# Patient Record
Sex: Male | Born: 1945 | Race: White | Hispanic: No | Marital: Married | Smoking: Former smoker
Health system: Southern US, Community
[De-identification: ages and names within clinical notes are randomized; demographics above are authoritative.]

## PROBLEM LIST (undated history)

## (undated) DIAGNOSIS — I2510 Atherosclerotic heart disease of native coronary artery without angina pectoris: Secondary | ICD-10-CM

## (undated) DIAGNOSIS — N400 Enlarged prostate without lower urinary tract symptoms: Secondary | ICD-10-CM

## (undated) DIAGNOSIS — K635 Polyp of colon: Secondary | ICD-10-CM

## (undated) DIAGNOSIS — M25519 Pain in unspecified shoulder: Secondary | ICD-10-CM

## (undated) DIAGNOSIS — R0989 Other specified symptoms and signs involving the circulatory and respiratory systems: Secondary | ICD-10-CM

## (undated) DIAGNOSIS — E785 Hyperlipidemia, unspecified: Secondary | ICD-10-CM

## (undated) DIAGNOSIS — I10 Essential (primary) hypertension: Secondary | ICD-10-CM

## (undated) DIAGNOSIS — H269 Unspecified cataract: Secondary | ICD-10-CM

## (undated) DIAGNOSIS — R0602 Shortness of breath: Secondary | ICD-10-CM

## (undated) DIAGNOSIS — I219 Acute myocardial infarction, unspecified: Secondary | ICD-10-CM

## (undated) DIAGNOSIS — N200 Calculus of kidney: Secondary | ICD-10-CM

## (undated) DIAGNOSIS — C61 Malignant neoplasm of prostate: Secondary | ICD-10-CM

## (undated) DIAGNOSIS — I4891 Unspecified atrial fibrillation: Secondary | ICD-10-CM

## (undated) HISTORY — DX: Other specified symptoms and signs involving the circulatory and respiratory systems: R09.89

## (undated) HISTORY — PX: CATARACT EXTRACTION, BILATERAL: SHX1313

## (undated) HISTORY — DX: Unspecified cataract: H26.9

## (undated) HISTORY — DX: Acute myocardial infarction, unspecified: I21.9

## (undated) HISTORY — DX: Unspecified atrial fibrillation: I48.91

## (undated) HISTORY — DX: Malignant neoplasm of prostate: C61

## (undated) HISTORY — DX: Pain in unspecified shoulder: M25.519

## (undated) HISTORY — DX: Shortness of breath: R06.02

## (undated) HISTORY — DX: Polyp of colon: K63.5

## (undated) HISTORY — DX: Hyperlipidemia, unspecified: E78.5

## (undated) HISTORY — PX: BLADDER SURGERY: SHX569

## (undated) HISTORY — DX: Calculus of kidney: N20.0

## (undated) HISTORY — DX: Atherosclerotic heart disease of native coronary artery without angina pectoris: I25.10

## (undated) HISTORY — DX: Benign prostatic hyperplasia without lower urinary tract symptoms: N40.0

## (undated) HISTORY — DX: Essential (primary) hypertension: I10

---

## 1995-10-31 DIAGNOSIS — I219 Acute myocardial infarction, unspecified: Secondary | ICD-10-CM

## 1995-10-31 HISTORY — DX: Acute myocardial infarction, unspecified: I21.9

## 2003-01-01 ENCOUNTER — Observation Stay (HOSPITAL_COMMUNITY): Admission: RE | Admit: 2003-01-01 | Discharge: 2003-01-02 | Payer: Self-pay | Admitting: Neurosurgery

## 2004-04-19 ENCOUNTER — Inpatient Hospital Stay (HOSPITAL_COMMUNITY): Admission: EM | Admit: 2004-04-19 | Discharge: 2004-04-24 | Payer: Self-pay | Admitting: Cardiovascular Disease

## 2004-04-19 HISTORY — PX: OTHER SURGICAL HISTORY: SHX169

## 2004-04-19 HISTORY — PX: CARDIAC CATHETERIZATION: SHX172

## 2004-04-20 HISTORY — PX: CORONARY ARTERY BYPASS GRAFT: SHX141

## 2004-05-20 ENCOUNTER — Encounter: Admission: RE | Admit: 2004-05-20 | Discharge: 2004-05-20 | Payer: Self-pay | Admitting: Cardiothoracic Surgery

## 2005-07-23 IMAGING — CR DG CHEST 1V PORT
1 series · 1 of 1 positions shown · non-contrast
Comparison: none

CLINICAL DATA: Acute M.I. 
 CHEST PORTABLE ([DATE] HOURS)
 Heart size is normal.  There is no heart failure.  Lungs are clear.  Probable COPD. 
 IMPRESSION
 No active disease.

[view not recorded]
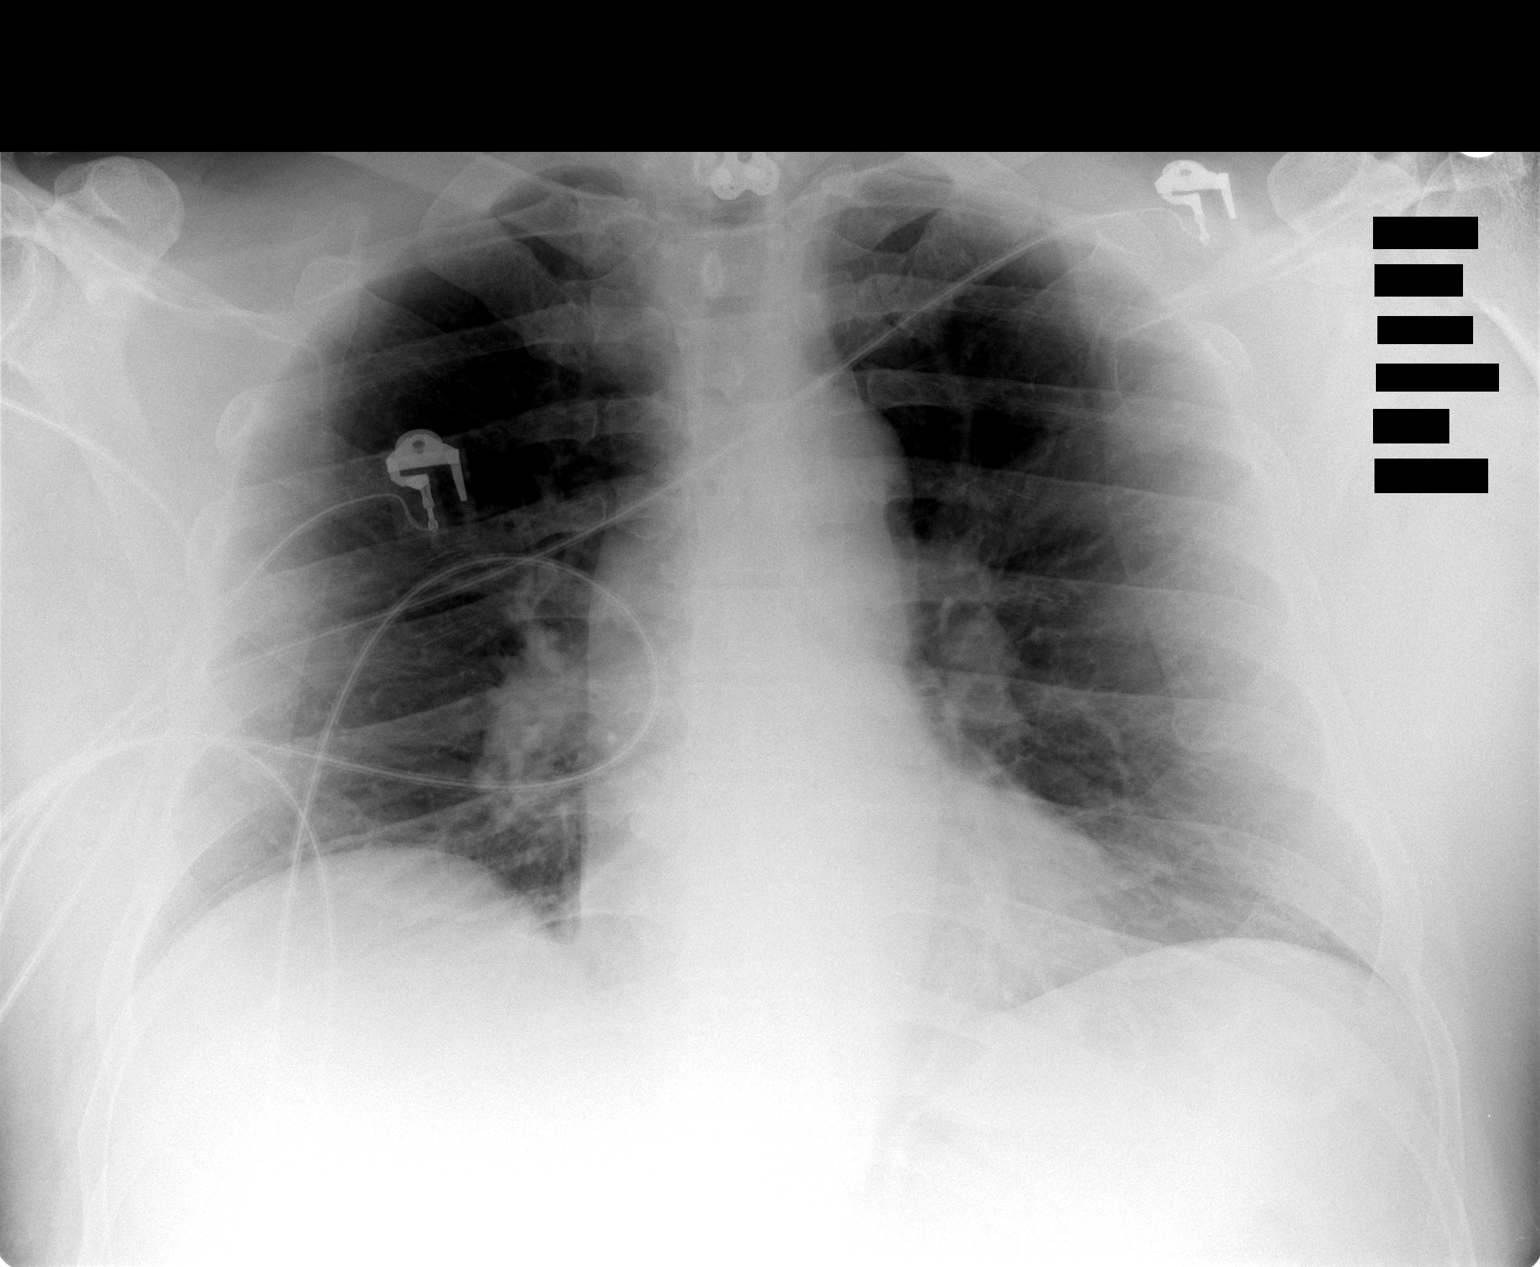

[1 of 1 positions shown; findings below may reference images not displayed]

## 2008-05-14 ENCOUNTER — Ambulatory Visit: Payer: Self-pay | Admitting: Internal Medicine

## 2008-05-22 ENCOUNTER — Ambulatory Visit: Payer: Self-pay | Admitting: Internal Medicine

## 2008-05-22 ENCOUNTER — Encounter: Payer: Self-pay | Admitting: Internal Medicine

## 2008-05-26 ENCOUNTER — Encounter: Payer: Self-pay | Admitting: Internal Medicine

## 2009-07-01 HISTORY — PX: CARDIOVASCULAR STRESS TEST: SHX262

## 2011-03-17 NOTE — H&P (Signed)
Leonard Washington, Leonard Washington                            ACCOUNT NO.:  [id number]   MEDICAL RECORD NO.:  [id number]                   PATIENT TYPE:  INP   LOCATION:  2027                                 FACILITY:  [facility]   PHYSICIAN:  Leonard Washington, M.D.          DATE OF BIRTH:  1946/07/30   DATE OF ADMISSION:  04/19/2004  DATE OF DISCHARGE:  04/24/2004                                HISTORY & PHYSICAL   CHIEF COMPLAINT:  Chest pain.   HISTORY OF PRESENT ILLNESS:  The patient is a 65 year old male with a past  history of coronary disease.  He had two LAD stents placed by Dr. Alanda Washington  in 1997.  He followed up with Dr. Sherlyn Washington in [place] after that but has not  seen him since 2001.  He has had no recurrent cardiac problems.  He has been  working full time.  Two days prior to his admission he developed some  substernal chest pain that lasted about 10-15 minutes and was relieved  spontaneously.  He did not seek medical attention.  At about 11 p.m. on April 18, 2004, he was awakened with severe substernal chest pain with radiation  to the left neck and left upper extremity with nausea and diaphoresis.  He  presented to [facility] Emergency Room.  EKG there showed inferior  lateral ST elevation with small Q-waves.  His pain was controlled to 1/10  with nitroglycerin, heparin and oxygen.  He was transferred by [facility] to  [facility] and seen by Dr. Alanda Washington at about 2 a.m. on April 19, 2004.  Dr.  Alanda Washington felt it would be best to proceed with catheterization.  The  patient's past medical history is unremarkable for hypertension or diabetes.  His cholesterol status is unknown.   PAST SURGICAL HISTORY:  His only surgeries in the past include C7-T1  cervical diskectomy and decompression by Dr. Delma Washington in March 2004.   CURRENT MEDICATIONS:  None.   ALLERGIES:  He has no known drug allergies.   SOCIAL HISTORY:  He is a nonsmoker.  He quit in the 1980s.  He is married  with three  children and he has one grandchild.  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   FAMILY HISTORY:  His family history is unremarkable for coronary disease.  His father died at 63 in accident.  His mother is alive at 57.  He has no  brothers and he has a step sister.   REVIEW OF SYSTEMS:  Essentially unremarkable except as noted above.  He does  wear glasses.  He denies any melena, peptic ulcer disease or GI bleeding.  He has not had syncope.  He has never had a stroke or symptoms of TIA.  He  has not had thyroid problems.  He has not had kidney problems or prostate  trouble.   PHYSICAL EXAMINATION:  VITAL SIGNS:  Blood pressure 140/80, pulse 68,  respirations 20.  GENERAL:  He is a well-developed, well-nourished male in no acute distress.  HEENT:  Normocephalic.  Extraocular movements are intact.  Sclerae is  nonicteric.  NECK:  Without JVD or bruit.  CHEST:  Clear to auscultation and percussion.  CARDIAC:  Regular rate and rhythm without obvious murmur, rub or gallop.  Normal S1 and S2.  The PMI is at the left sixth intercostal space.  He has  no S3.  ABDOMEN:  Nontender.  No hepatosplenomegaly.  No bruits.  EXTREMITIES:  Without edema. Distal pulses are intact bilaterally.  Femorals  are 3+ without bruits.  NEUROLOGICAL:  Exam is grossly intact.  He is awake, alert, oriented,  cooperative and moves all extremities without obvious deficits.   LABORATORY DATA:  His EKG reveals Q-waves in the inferior leads consistent  with a DMI and ST elevation in the inferior lateral leads.   Labs:  Renal function is normal with a BUN of 15, creatinine 0.9.  Hemoglobin 16.9, hematocrit 51.2.  Potassium 3.4.   Chest x-ray shows no acute changes.   IMPRESSION:  1. Acute diaphragmatic myocardial infarction.  2. Known coronary disease with prior left anterior descending stenting x2     April 1997.  3. Medical noncompliance with no followup since 2001.  4. 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  5. Ex-smoker.   PLAN:  The patient was seen by Dr. Alanda Washington and admitted, taken to the  catheterization lab.      Leonard Washington, P.A.                      Leonard Washington, M.D.    Leonard Washington  D:  04/24/2004  T:  04/24/2004  Job:  [id number]

## 2011-03-17 NOTE — Discharge Summary (Signed)
Leonard Washington, Leonard Washington                            ACCOUNT NO.:  [id number]   MEDICAL RECORD NO.:  [id number]                   PATIENT TYPE:  INP   LOCATION:  2027                                 FACILITY:  [facility]   PHYSICIAN:  Gwenith Daily. Tyrone Sage, M.D.            DATE OF BIRTH:  1945-11-30   DATE OF ADMISSION:  04/19/2004  DATE OF DISCHARGE:  04/24/2004                                 DISCHARGE SUMMARY   CARDIOLOGIST:  Gerlene Burdock A. Alanda Amass, M.D.   PRIMARY CARE PHYSICIAN:  Dr. Roney Marion   ADMISSION DIAGNOSES:  Known coronary artery disease status post acute  myocardial infarction.   DISCHARGE/SECONDARY DIAGNOSES:  1. Known coronary artery disease status post acute myocardial infarction.     Status post coronary artery bypass grafting.  2. History of prior acute anterior myocardial infarction in 1997 status post     2 stents placed in his left anterior descending artery.  3. Hyperlipidemia.  4. History of tobacco use, quit in 1980.  5. Situational anxiety.  6. Postoperative insomnia, most likely contributed by anxiety.  7. History of anterior cervical diskectomy/decompression by Dr. Lovell Sheehan in     2004.  8. History of back surgery in 1976 by Dr. Samara Snide.   BRIEF HISTORY:  Leonard Washington is a 65 year old Caucasian male with known coronary  artery disease having two stents placed in his LAD in 1997 after suffering  an acute myocardial infarction. He did well after several years and was no  longer being followed by his cardiologist; however, the night of April 18, 2004, he awakened with substernal chest pain radiating to his shoulders  bilaterally. He had noticed this two days prior as well; however, this  resolved spontaneously. Because of ongoing pain and recurrent nature of the  pain, he presented to [facility] emergency room and was treated. Within 30  minutes, he became pain free and was transferred to [facility] on  April 19, 2004 to undergo cardiac catheterization by Dr.  Alanda Amass.   On April 19, 2004, Leonard Washington was transferred from [facility] emergency room to  [facility] to undergo cardiac catheterization by Dr. Alanda Amass.  Cardiac catheterization findings showed 70% left main stenosis with three-  vessel coronary artery disease. His ejection fraction was 50%. Based on  these findings, he was referred to Dr. Sheliah Plane for possible surgical  revascularization. Dr. Tyrone Sage did evaluation Mr. Llamas that same day. After  exam of the patient and review of his cardiac catheterization, Dr. Tyrone Sage  did feel that he would benefit from coronary artery bypass surgery; however,  Dr. Tyrone Sage would be unable to perform the procedure due to scheduling  conflicts. Instead was to be performed by Dr. Dennie Maizes associate, Dr.  Kathlee Nations Trigt.   On April 20, 2004, Leonard Washington underwent coronary artery bypass grafting x4  using a left internal mammary artery to the left anterior descending artery,  left  greater artery to the right coronary artery, saphenous vein graft to  the ramus intermedius, and saphenous vein graft to the circumflex marginal.  Endoscopic vein harvesting was taken from the right thigh. He tolerated the  procedure well and was transferred from the operating room to the surgical  intensive care unit in stable condition.   Leonard Washington postoperative course was rather noneventful. He did remain in  intensive care unit through postoperative day #2. He was extubated  neurologically intact. He initially did have a pericardial rub which did  resolve. He also required dopamine drip postoperatively but was weaned  without difficulty. He did show mild fluid volume excess and was started on  short term diuretic therapy. His postoperative labs were stable.   On the afternoon of postoperative day #2, Leonard Washington was transferred to unit  2000, a telemetry unit. Over the next few days, he began mobilizing with  cardiac rehab. Eventually, he was ambulating the  hallways without assist. He  was tolerating oral diet, and his bowel and bladder functions had returned  to normal. His pain was controlled on oral medication. His blood sugars were  monitored initially postoperatively but were relatively normal, ranging  between 101 and 129. A hemoglobin A1c was drawn which was normal at 5.4.  Prior to his transfer to the floor, his chest tube had also been  discontinued without incident. There was no pneumothorax. Followup chest x-  ray showed bibasilar atelectasis with small effusions. On postoperative day  #3, he did report feelings of anxiety and was unable to sleep. Ativan and  Ambien were prescribed as needed. The following day, Leonard Washington reported that  he had slept much better and felt much more at ease. He was anxious to be  discharged home. He hemodynamically stable with a blood pressure of 109/78.  His heart rate was primarily in the 80s and 90s and was in sinus rhythm. He  was afebrile and his oxygen saturation was above 90% on room air. He had had  excellent diuresis, and his weight was now at baseline. On exam, his heart  had a regular rate and rhythm. No murmurs, rubs, or gallops were noted. His  lungs were clear. His abdominal exam was benign. His extremities were  without edema. His incisions were clean, dry, and intact without signs of  infection. His left hand was neurovascularly intact. It was felt due to his  excellent progression that he was stable for discharge home, and he was  discharged home later that day, April 24, 2004.   PROCEDURE:  1. On April 20, 2004, Leonard Washington underwent coronary artery bypass grafting x4     using a left internal mammary artery to the left anterior descending     artery, left radial artery to the right coronary artery, saphenous vein     graft to the ramus intermedius, saphenous vein graft to the circumflex    marginal, endoscopic radial harvest from the left arm and endoscopic vein     harvesting from the  right thigh. Surgeon was Dr. Kathlee Nations Trigt.  2. On April 19, 2004, Leonard Washington underwent cardiac catheterization by Dr.     Susa Griffins. Findings essentially showed left main disease with     three-vessel coronary artery disease. His ejection fraction was estimated     at 50 to 55%.   LABORATORY DATA:  On April 23, 2004, his white blood cell count was 9.8,  hemoglobin 9.8, hematocrit 28.2, platelet count 200. Sodium  137, potassium  3.8, blood glucose 112, BUN 18, creatinine 0.9. On June 21, hemoglobin A1c  was 5.4. His lipid panel showed total cholesterol of 198, triglycerides 132,  HDL 44, LDL 128. His liver function tests were normal with a total bilirubin  of 0.3, alkaline phosphatase 52, SGOT 27, SGPT 22. His total protein was  5.8, albumin 3.0. Preoperatively, his total CK was 169 with MB of 18 and a  troponin maximum of 1.5.   RADIOLOGIC FINDINGS:  Chest x-ray on April 23, 2004 showed stable appearance  of his chest with bibasilar atelectasis and small effusions.   Preoperative Dopplers showed no significant internal carotid artery disease  and normal ABIs measuring 1.47 bilaterally.   DISCHARGE MEDICATIONS:  1. Enteric-coated aspirin 325 mg 1 p.o. q.d.  2. Lopressor 25 mg 1 p.o. b.i.d.  3. Lipitor 20 mg 1 p.o. p.m.  4. Imdur 50 mg 1 p.o. q.d. for 1 month.  5. Ambien 5 mg 1 p.o. q.h.s. p.r.n. insomnia.  6. Ativan 0.5 mg 1 to 2 tablets p.o. b.i.d. p.r.n. anxiety.  7. Ultram 50 mg 1 to 2 tablets p.o. q.4-6h. p.r.n. pain.   ACTIVITY:  He is instructed to avoid driving or heavy lifting more than 10  pounds. He is encouraged to continue daily walking and breathing exercises.   DIET:  He is to follow a low fat, low salt diet.   WOUND CARE:  He may shower. He should notify the CVTS office if he develops  fever greater than 101 or redness or drainage from his incisions sites.   FOLLOW UP:  He is to follow up with Dr. Donata Clay in approximately three  weeks. The CVTS office will  contact him with specific appointment date and  time. He is to have a chest x-ray one hour before this appointment at the  [facility] and was instructed to bring his chest x-ray  with him to the appointment with Dr. Donata Clay.   He is to call __________  to schedule two-week followup with Susa Griffins.      Jerold Coombe, P.A.                  Gwenith Daily Tyrone Sage, M.D.    AWZ/MEDQ  D:  04/24/2004  T:  04/25/2004  Job:  [id number]   cc:   Roney Marion, M.D.   Richard A. Alanda Amass, M.D.  [phone] [address], [address]  [place]  [place] [address]  Fax: [phone]

## 2011-03-17 NOTE — Consult Note (Signed)
NAMEANDER, WAMSER                            ACCOUNT NO.:  [id number]   MEDICAL RECORD NO.:  [id number]                   PATIENT TYPE:  INP   LOCATION:  2931                                 FACILITY:  [facility]   PHYSICIAN:  Gwenith Daily. Tyrone Sage, M.D.            DATE OF BIRTH:  [birth year]   DATE OF CONSULTATION:  04/19/2004  DATE OF DISCHARGE:                                   CONSULTATION   REQUESTING PHYSICIAN:  Richard A. Alanda Amass, M.D.   FOLLOW UP CARDIOLOGIST:  Pearletha Furl. Alanda Amass, M.D.   PRIMARY CARE PHYSICIAN:  Gayland Curry, M.D., [place].   REASON FOR CONSULTATION:  Acute myocardial infarction.   HISTORY OF PRESENT ILLNESS:  The patient is a 65 year old male with known  coronary occlusive disease, having had 2 stents placed in the LAD in 1997,  after suffering an acute anterior myocardial infarction.  He did well and  after several years, quite cardiology follow up.   Last night, he was awakened with substernal chest pain radiating to his  shoulders bilaterally.  He noted that 2 days previous, he had a brief  episode of pain that resolved spontaneously. Because of the ongoing pain and  recurrent nature of the pain, he went to the [facility] Emergency Room and was  treated.   The patient noted that within 30 minutes, he became pain-free, was  transferred here today and underwent cardiac catheterization by Dr.  Alanda Amass.  Since admission to the hospital, he has had no recurrent chest  pain.  His total CK was 169 with MB of 18, troponin maximum of 1.5.   PAST MEDICAL/CARDIAC HISTORY:  As noted above, acute MI in 1997 and  subsequent stents placement in the LAD.  He thought he had an echocardiogram  done 2-3 years ago, but we do not have the results.   CARDIAC RISK FACTORS:  The patient denied hypertension.  He is unsure of his  lipid status (he did stop taking Zocor in 2001).  Does have a positive  family history for cardiac disease.  He does have diabetes.  Remote  smoker,  having quit in 1980. Denies previous stroke, claudication or renal  insufficiency.   PAST SURGICAL HISTORY:  In March 2004, anterior cervical diskectomy and  decompression with anterior cervical plating.  Lumbar back surgery 1976.   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   MEDICATIONS:  At the time of admission, he was on no medications.   ALLERGIES:  NONE.   CARDIAC REVIEW OF SYSTEMS:  Positive for chest pain. Denies resting  shortness of breath, exertional shortness of breath, orthopnea, syncope,  presyncope, palpitations, lower extremities edema.   GENERAL REVIEW OF SYSTEMS:  Denies any constitutional symptoms, denies  respiratory symptoms, denies hemoptysis.  Gastrointestinal:  History of  indigestion, relieved with Tums for several years.  Denies substernal  discomfort.  NEUROLOGIC:  Denies any TIAs or history of stroke.  MUSCULOSKELETAL:  Has generalized pain in both knees and hips.  GENITOURINARY:  Some urinary hesitancy with starting urination.  Denies  polyuria, polydipsia. INFECTIONS:  One month ago, he had an infection  involving the left eyelid, this has subsequently cleared.  HEMATOLOGIC:  Denies.  PSYCHIATRIC:  He does have some anxiety but no psychiatric  diagnosis.  HEENT:  Mild hearing loss, wears glasses.   PHYSICAL EXAMINATION:  VITAL SIGNS:  Blood pressure 122/80, pulse 80,  respiratory rate 22, O2 saturation 97% on 2 L, 6 feet tall.  GENERAL:  He is healthy-appearing, alert, neurologically intact.  HEENT:  Pupils are equal, round and reactive to light.  NECK:  Without carotid bruits or jugulovenous distention.  CARDIOVASCULAR:  Regular rate and rhythm without murmur or gallop.  ABDOMEN:  Benign without palpable masses.  EXTREMITIES:  Without ischemic changes.  Appears to have adequate veins in  both lower extremities.  No areas of ischemic changes in the lower  extremities.  VASCULAR:   With 2+ DP and PT pulses bilaterally.   LABORATORY DATA:  White count 12,800, hemoglobin 16.9, hematocrit 57.  PT  10.5, INR 1, PTT 30.5.  Potassium 4.2, creatinine 0.8.  Cardiac  catheterization films were reviewed:  The patient has 50% ejection fraction,  80% distal left main, proximal 80% LAD lesion and mid-95% lesions,  circumflex has a 95% proximal and 75% mid-lesions. Right coronary artery is  dominant, has 90% mid-right coronary lesion.   Doppler findings revealed normal palmar arches bilaterally in the upper  extremities; patient is left-handed.   IMPRESSION:  A 65 year old male with severe 3-vessel coronary artery disease  including distal left main obstruction. Agree with the recommendation to  proceed with coronary artery bypass grafting.  The risks of surgery  including death, infection, stroke, myocardial infarction, bleeding,  transfusion were all discussed with the patient. Also discussed use of  possible left radial artery as a source of conduit also.  The patient and  his wife had their questions answered and he is willing to proceed.   Dr. Donata Clay is available to perform the surgery tomorrow morning. This was  discussed with the patient and he is agreeable with this approach.                                               Gwenith Daily Tyrone Sage, M.D.    Tyson Babinski  D:  04/19/2004  T:  04/19/2004  Job:  [id number]

## 2011-03-17 NOTE — Op Note (Signed)
Leonard Washington, Leonard Washington                            ACCOUNT NO.:  [id number]   MEDICAL RECORD NO.:  [id number]                   PATIENT TYPE:  INP   LOCATION:  3002                                 FACILITY:  [facility]   PHYSICIAN:  Cristi Loron, M.D.            DATE OF BIRTH:  1946/08/28   DATE OF PROCEDURE:  01/01/2003  DATE OF DISCHARGE:                                 OPERATIVE REPORT   PREOPERATIVE DIAGNOSIS:  Left C7-T herniated nucleus pulposus, spinal  stenosis, cervical radiculopathy, cervicalgia.   POSTOPERATIVE DIAGNOSIS:  Left C7-T herniated nucleus pulposus, spinal  stenosis, cervical radiculopathy, cervicalgia.   OPERATION:  C7-T1 extensive anterior cervical diskectomy/decompression,  interbody iliac crest Allograft arthrodesis; anterior cervical plating  (Synthes titanium plate and screws).   SURGEON:  Cristi Loron, M.D.   ASSISTANT:  Clydene Fake, M.D.   ANESTHESIA:  General endotracheal   ESTIMATED BLOOD LOSS:  100 cc.   SPECIMENS:  None.   DATA REVIEWED:  None   COMPLICATIONS:  None   BRIEF HISTORY:  The patient is a 65 year old white male who has suffered  from severe neck and left arm pain.  He failed medical management and was  worked up with a cervical MRI which demonstrated a large herniated disc at  C7-T1 on the left.  His signs, symptoms and physical exam were consistent  with left C8 radiculopathy.  I discussed the various treatment options with  him including surgery.  The patient weighted the risks, benefits and  alternatives of surgery and decided to proceed with the operation.   DESCRIPTION OF PROCEDURE:  The patient was brought to the operating room  where anesthesia team.  General endotracheal anesthesia was induced.  A roll  was then placed under the patient's shoulders.  We placed his neck in slight  extension.  His anterior cervical region was then prepared with Betadine  scrub and Betadine solution.  Sterile drapes were  applied.  I then injected  the area to be incised with Marcaine with epinephrine solution.  I used a  scalpel to make a transverse incision in the patient's left anterior neck.  I used the Metzenbaum scissors to divide the platysma muscle and then to  dissect medial to the sternocleidomastoid, the jugular vein and carotid  artery.  I bluntly dissected towards the anterior cervical spine, carefully  identifying the esophagus and retracting it medially.  I then cleared the  soft tissue from the anterior cervical spine using the Kitner swabs and  inserted a thin spinal needle into the upper exposed interspace.  We then  obtained intraoperative radiograph to confirm our location.   We then used electrocautery to detach the medial border of the longus colli  muscle bilaterally from the C7-T1 intervertebral disc space.  I then  inserted the Caspar self retaining retractor for exposure.  We excised the  C7-T1 intervertebral disc with a 15 blade  scalpel and performed partial  diskectomy with a clear tip forceps.  We inserted distractor screws at C7  and T1, distracted the interspace and then used the high speed drill to  decorticate the vertebral end plates at B1-Y7 and drill away the remainder  of the intervertebral disc as well as to clean out the posterior  longitudinal ligament.  We then incised the ligament with arachnoid knife  and then removed it with a Kerrison punch undercutting the vertebral plates  at W2-N5 decompressing the thecal sac.  On the left side there was a huge  herniated disc which was removed in several large fragments with a pituitary  forceps.  We then performed a generous foraminotomy about the C8 nerve  roots.   We now turned our attention to the arthrodesis.  We obtained iliac crest  tricortical Allograft bone graft and fashioned to these approximate  dimensions 9 mm height, 1 cm in depth.  We inserted the bone graft into the  distracted C7-T1 interspace and then  removed distraction screws and there  was a good snug fit of the bone graft.   We now turned our attention to anterior spine instrumentation.  We obtained  an appropriate length Synthes anterior cervical plate and laid it along the  anterior aspect of the vertebral bodies of C7 and T1.  Drilled holes at C7  to T1.  Tapped the holes and then secured the plate to the vertebral bodies  by placing two 14 mm screws at each vertebral body.  We did not at this  point get an intraoperative x-ray, as per our routine, as there was no way  we would be able to see down at that level because of the patient's body  habitus.  The screws were secured to the plate by placing a locking screw  and then obtained strenuous hemostasis using bipolar electrocautery and  Gelfoam.  We then copiously irrigated the wound out with Bacitracin solution  and removed this.  We then removed the Caspar self retaining retractor.  We  inspected the esophagus for any damage.  There was none apparent.  We then  reapproximated the platysma muscle with interrupted 3-0 Vicryl suture and  the subcutaneous tissue with interrupted 3-0 Vicryl suture and the skin with  Steri-Strips.  The wound was covered with Bacitracin ointment.  A sterile  dressing was applied.  The drapes were removed and the patient was  subsequently extubated by the anesthesia team and transported to the  postanesthesia care unit in stable condition.  All sponge, instrument and  needle counts were correct at the end of this case.                                                Cristi Loron, M.D.    JDJ/MEDQ  D:  01/01/2003  T:  01/02/2003  Job:  [id number]

## 2011-03-17 NOTE — Op Note (Signed)
Leonard Washington, Leonard Washington                            ACCOUNT NO.:  [id number]   MEDICAL RECORD NO.:  [id number]                   PATIENT TYPE:  INP   LOCATION:  2312                                 FACILITY:  [facility]   PHYSICIAN:  Kerin Perna III, M.D.           DATE OF BIRTH:  Sep 05, 1946   DATE OF PROCEDURE:  04/20/2004  DATE OF DISCHARGE:                                 OPERATIVE REPORT   PREOPERATIVE DIAGNOSES:  Class 4 unstable angina, subendocardial myocardial  infarction with 3-vessel disease.   POSTOPERATIVE DIAGNOSES:  Class 4 unstable angina, subendocardial myocardial  infarction with 3-vessel disease.   PROCEDURE:  Coronary artery bypass grafting x 4 (left internal mammary  artery to left anterior descending artery, left radial artery to right  coronary artery, saphenous vein graft to ramus intermediate, saphenous vein  graft to circumflex marginal.   SURGEON:  Kerin Perna, M.D.   ASSISTANT:  Coral Ceo, P.A.   ANESTHESIA:  General by Adonis Huguenin, M.D.   INDICATIONS FOR PROCEDURE:  The patient is a 65 year old male who presented  with acute onset of chest pain and positive enzymes. Cardiac catheterization  by Dr. Alanda Amass demonstrated 70% left main stenosis with 3-vessel coronary  disease.  The patient had previously placed stents in the proximal LAD.  His  ejection fraction was 50%.  He was felt to be a candidate for surgical  revascularization.   The patient was seen initially in consultation by Dr. Sheliah Plane who  reviewed the results of the cine-cath with the patient and family, and  discussed the indications and expected benefits and risks of surgery with  the patient and his family.  I examined the patient in the OR preop holding  area and reviewed the plans for surgery with the patient just prior to  surgery. All questions were addressed, and the patient understood the  rationale for the surgery, the alternatives and the risks.   OPERATIVE FINDINGS:   The LAD had some distal disease which was mild to  moderate. The mammary artery was placed to the mid-portion of the vessel as  the distal portion of the mammary was too small for the anastomosis.  The  radial artery was harvested using the endoscopic technique from the left  forearm and was a good graft placed to the distal right coronary. The  saphenous vein was somewhat small but adequate, and was used for the ramus  intermediate and circumflex marginal.  No blood products were used for the  operation.  The patient received the Aprotinin protocol.   DESCRIPTION OF PROCEDURE:  The patient was brought to the operating room and  placed supine on the operating table where general anesthesia was induced  under invasive hemodynamic monitoring. The left arm, chest, abdomen and legs  were prepped with Betadine and draped as a sterile field. A sternal incision  was made as the radial artery was harvested from  the left arm using  endoscopic technique.  The left internal mammary artery was harvested as a  pedicle graft from its origin at the subclavian vessels. It was a good  vessel with excellent flow. The arm was then tucked to the patient's side  and protected with padding.  The vein was harvested from the right leg using  endoscopic technique.   The sternal retractor was placed. The pericardium was suspended as a cradle.   Heparin was administered and ACT was documented as being therapeutic for  bypass.  Three pursestrings placed in the ascending aorta and right atrium.  The patient was cannulated and placed on bypass.   The coronaries were identified for grafting and cardioplegic cannula was  placed for both antegrade and retrograde coronary sinus cardioplegia.  The  saphenous vein, radial artery and mammary arteries were prepared for the  distal anastomoses.  The patient was cooled to 30 degrees. The aortic cross-  clamp was applied. Then 800 cc of cold blood cardioplegia was delivered  in  split doses between the antegrade aortic and retrograde coronary sinus  catheters. There was good cardioplegic arrest and septal temperature dropped  to less than 15 degrees.  Topical iced saline was used to augment myocardial  preservation and a pericardia insufflator pad was used to protect the left  phrenic nerve.   The distal coronary anastomoses was then performed.  First distal  anastomosis was to the ramus intermedius. This was a 1.5 mm vessel, proximal  95% stenosis.  A reverse saphenous vein was sewn end-to-side with running 7-  0 Prolene with good flow through the graft.  The second distal anastomosis  was to the circumflex marginal.  This was a 1.75 mm vessel, proximal 90% stenosis.  A reverse saphenous vein  was  sewn end-to-side with running 7-0 Prolene. There was good flow through  the graft. Cardioplegia was re-dosed.   A third distal anastomosis was to the distal right coronary.  This was a 1.8-  2.0 mm vessel, proximal 90% stenosis.  The radial artery graft  was sewn end-to-side with running 8-0 Prolene with good flow through the  graft.  Cardioplegia was re-dosed   A fourth distal anastomosis was to the mid-portion of the LAD which was a  1.8 mm vessel, proximal 95% stenosis.  The left IMA pedicle was brought  through an opening created in the left lateral pericardium. It was brought  down to the LAD and sewn end-to-side with running 8-0 Prolene.  There was a  good flow through the anastomosis with a rise in septal temperature after  release of the pedicle clamp on the mammary artery.  The mammary pedicle was  secured to the epicardium and the aortic cross-clamp was removed.   The heart resumed a spontaneous rhythm.  Using a partial occluding clamp, 3  proximal anastomoses were placed on the ascending aorta using a 4-0 mm punch  and running 6-0 Prolene for the vein and a running 7-0 Prolene for the radial artery. There was a good flow through the grafts after the  release of  the partial occluding clamp on the aorta.   The patient was rewarmed and reperfused. Temporary pacing wires were  applied. The lungs were expanded and the ventilator was resumed. The patient  was weaned from bypass without difficulty, with inotropes.  Cardiac output  and blood pressure were stable.  Protamine was administered without adverse  reaction.  The cannulas were removed. The mediastinum was irrigated with  warm, antibiotic irrigation. The left arm incision had been closed prior to  tucking the arm back to the patient's side.   Sternal wires were placed, after closing the superior pericardial fat.  The pectoralis fascia was closed with running #1 Vicryl. The subcutaneous  and skin were closed with running Vicryl.  Sterile dressings were applied.  Total bypass time was 140 minutes with  cross-clamp time of 70 minutes.                                               Mikey Bussing, M.D.    PV/MEDQ  D:  04/20/2004  T:  04/21/2004  Job:  [id number]   cc:   Attn:  [facility]. [facility]   CVTS office

## 2011-03-17 NOTE — Cardiovascular Report (Signed)
NAMESHELLEY, POOLEY                            ACCOUNT NO.:  [id number]   MEDICAL RECORD NO.:  [id number]                   PATIENT TYPE:  INP   LOCATION:  2931                                 FACILITY:  [facility]   PHYSICIAN:  Richard A. Alanda Amass, M.D.          DATE OF BIRTH:  [birth year]   DATE OF PROCEDURE:  04/19/2004  DATE OF DISCHARGE:                              CARDIAC CATHETERIZATION   PROCEDURE:  Retrograde central aortic catheterization, selective coronary  angiography via Judkins technique, LV angiogram RAO, LAO projection,  subselective LIMA, RIMA, abdominal aortic angiogram midstream PA projection.   BRIEF HISTORY:  Mr. Dagostino is a 65 year old white married father of three with  one grandchild.  He discontinued smoking in 1980.  He is a medical patient  Dr. Lawana Pai for 17 years.  He is a prior cardiac patient of Dr. Thereasa Solo Dhatt  and has not seen him since at least 2000.  The patient had emergency  percutaneous transluminal coronary angioplasty and J&J 1535 stent placed for  AWMI, February 19, 1996.  Apparently, had only single vessel disease at that  time (records not available) and was treated medically.  He had previously  discontinued smoking in 1980.  He has a history of hypertension, exogenous  obesity and cholesterols have according to the patient been in the 240s.  He  stopped all of his medicines approximately four to five years ago including  aspirin.  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  He had done well.  He has had a C7-T1 cervical diskectomy by Dr.  Delma Officer that was uncomplicated [facility], January 01, 2003.  He has done well  working full time with no significant cardiac symptoms despite no  medications.  Two days prior to admission on June 18 he developed an episode  of substernal chest pain lasting approximately 10-15 minutes that was  relieved spontaneously.  He did not seek medical attention.  On April 18, 2004 at 11 p.m. he  was awakened with severe substernal chest pain with  radiation to the left neck and left upper extremity associated with nausea,  diaphoresis without emesis.  He drove himself to [facility] Emergency [facility].  EKG in the emergency room showed inferolateral ST elevation with small Q  waves compatible with inferior and or apical myocardial infarction.  His  pain was brought down to a +1/+10 from a severe level in the emergency room  where he was given IV nitroglycerin, one adult aspirin, heparin bolus of  4000, drip-heparin.  I cannot tell from the records whether he had narcotic  analgesics.  He was transferred by [facility] directly to the cardiac  catheterization laboratory where he was seen, evaluated and examined by me.  At that time, he was not having chest pain.  Emergency diagnostic  catheterization was performed via standard technique.   1% Xylocaine was used for local anesthesia.  The patient was given 2 mg of  Versed for sedation and a 6 [place] short Daig side arm sheath was inserted  in the CFRA via standard Judkins technique with single puncture with a 18  thin wall needle.  Diagnostic coronary angiography was done with 6 French 4  cm taper Cordis preformed coronary catheters.  Subselective LIMA and RIMA  revealed patent IMAs.  No subclavian or brachiocephalic stenosis.   LV angiogram was done in the RAO and  LAO projection with pigtail catheter  at 25 mL, 14 mL per second; 20 mL, 12 mL per second.  Pullback pressure of  the CA showed no gradient across the aortic valve.  Abdominal aortic  angiogram was done above the level of the renal arteries at 25 mL, 20 mL per  second.  Catheter was removed. Side arm sheath was flushed.  ACT was 160 and  the patient was given 4000 units of heparin.  His drip continued.  IV  nitroglycerin continued and he was given Aggrastat double bolus plus  infusion and another aspirin the laboratory.  He was not given Plavix.   The patient was pain free at  this time and tolerated the diagnostic  procedure well.   Abdominal angiogram showed single patent renal arteries bilaterally with no  significant stenosis.  Celiac and SMA were intact.  IMA was intact.  There  was no significant iliac disease and the hypogastrics were intact  bilaterally with good runoff.   PRESSURES:  1. LV:  135/0; LVEDP 18-22 mmHg.  2. CA:  135/83 mmHg.  3. There is no gradient across the aortic valve on catheter pullback.   Fluoroscopy demonstrated +3 segmental proximal LAD and circumflex  calcification and +2 right coronary calcification.   The main left coronary artery had 50% mid and 80% distal stenosis.   Previously placed stent was visible in the proximal LAD.  This crossed the  septal perforator which was patent and there was a focal 70-80% stenosis at  the very proximal portion of the stent.  The remainder of the stent had  approximately 50% in-stent restenosis.   There was a small first diagonal from the proximal LAD just at the end of  the stented area that was patent.  There was moderate size second diagonal  from the mid LAD.  Just beyond the second diagonal was an 85% focal stenosis  and two tandem 95% stenoses at the junction of the mid and distal third of  the LAD.   The circumflex artery  gave off a thin first marginal and a moderate size  second marginal.  Following this there were tandem 95, 75% lesions.  The  third marginal was large and bifurcated and had 85% lesion.  Proximal to  this lesion, there was a large bifurcating PABG branch.   The right coronary artery was a dominant large vessel.  There was 90-95%  moderately segmental stenosis in the mid RCA between the RV branches.  The  remainder of the vessel was large.  There was a bifurcating PLA with no  significant disease in an AV node branch and bifurcating PDA that had no  significant disease.  There was TIMI-3 flow in all vessels.  There was no  acute thrombus visualized  angiographically.   This gentleman's history is as outlined above.  He had prolonged chest pain  with initially negative CPK at [facility].  Repeat EKG in the lab showed no  acute changes with small inferior Q waves.  Inferoapical myocardial  infarction on angiography.  The exact culprit lesion is not clear with  inferolateral ST elevation at [facility] probably related to apical  ischemia which could be combined from his LAD and right.  Fortunately, he is  pain free now with TIMI-3 flow and severe four vessel coronary disease.  The  patient will be continued on vigorous medical therapy and we will obtain  CVTS consultation in the morning with recommendations for multivessel  coronary artery bypass graft in this setting.   CATHETERIZATION DIAGNOSES:  1. 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  Initial CPK     negative.  Inferoapical, anteroapical wall motion abnormality, well     preserved ejection fraction 50-55%.  Pain free in lab.  2. Remote left anterior descending stent.  Treatment for anterior wall     myocardial infarction, emergency February 19, 1996.  3. Medical noncompliance.  4. Hyperlipidemia by history.  5. Exogenous obesity.  6. Remote smoking, quit in 1980.  7. Cervical diskectomy C7-T1, Dr. Lovell Sheehan, January 01, 2003.  Good result.  8. Systemic hypertension.  Normal renal arteries.                                               Richard A. Alanda Amass, M.D.    RAW/MEDQ  D:  04/19/2004  T:  04/19/2004  Job:  [id number]   cc:   Dr. Sibyl Parr, M.D.  [address]  [place]  [place] [address]  Fax: [phone]   CVTS office

## 2012-09-12 ENCOUNTER — Other Ambulatory Visit (HOSPITAL_COMMUNITY): Payer: Self-pay

## 2012-09-12 DIAGNOSIS — I2510 Atherosclerotic heart disease of native coronary artery without angina pectoris: Secondary | ICD-10-CM

## 2012-10-14 ENCOUNTER — Other Ambulatory Visit (HOSPITAL_COMMUNITY): Payer: Self-pay | Admitting: Cardiovascular Disease

## 2012-10-14 DIAGNOSIS — I2510 Atherosclerotic heart disease of native coronary artery without angina pectoris: Secondary | ICD-10-CM

## 2012-11-11 ENCOUNTER — Ambulatory Visit (HOSPITAL_COMMUNITY)
Admission: RE | Admit: 2012-11-11 | Discharge: 2012-11-11 | Disposition: A | Discharge status: Home / Self Care | Payer: Medicare Other | Source: Ambulatory Visit | Attending: Cardiovascular Disease | Admitting: Cardiovascular Disease

## 2012-11-11 DIAGNOSIS — I2510 Atherosclerotic heart disease of native coronary artery without angina pectoris: Secondary | ICD-10-CM | POA: Insufficient documentation

## 2012-11-11 HISTORY — PX: DOPPLER ECHOCARDIOGRAPHY: SHX263

## 2012-11-11 NOTE — Progress Notes (Signed)
2D Echo Performed 11/11/2012    Clearence Ped, RCS

## 2013-05-28 ENCOUNTER — Telehealth: Payer: Self-pay | Admitting: Cardiovascular Disease

## 2013-05-28 NOTE — Telephone Encounter (Signed)
Please mail patient a lab slip so he can have blood work prior to is appointment 06/19/13 with Dr.Weintraub.

## 2013-05-28 NOTE — Telephone Encounter (Signed)
Lab slip to be nailed out today

## 2013-06-03 NOTE — Telephone Encounter (Signed)
Needs a lab order so he can get his lab work before his appt.

## 2013-06-03 NOTE — Telephone Encounter (Signed)
Returned call.  Pt informed JC, LPN mailed lab slip last week.  Pt informed if he doesn't receive it by tomorrow to call back so another can be mailed out.  Pt verbalized understanding and agreed w/ plan.

## 2013-06-19 ENCOUNTER — Other Ambulatory Visit: Payer: Self-pay | Admitting: *Deleted

## 2013-06-19 ENCOUNTER — Other Ambulatory Visit: Payer: Self-pay | Admitting: Cardiovascular Disease

## 2013-06-19 DIAGNOSIS — I714 Abdominal aortic aneurysm, without rupture, unspecified: Secondary | ICD-10-CM

## 2013-06-19 DIAGNOSIS — R0989 Other specified symptoms and signs involving the circulatory and respiratory systems: Secondary | ICD-10-CM

## 2013-06-19 LAB — COMPREHENSIVE METABOLIC PANEL
ALT: 21 U/L (ref 0–53)
AST: 22 U/L (ref 0–37)
Albumin: 4.1 g/dL (ref 3.5–5.2)
Alkaline Phosphatase: 72 U/L (ref 39–117)
BUN: 12 mg/dL (ref 6–23)
CO2: 30 mEq/L (ref 19–32)
Chloride: 101 mEq/L (ref 96–112)
Glucose, Bld: 82 mg/dL (ref 70–99)
Potassium: 5.2 mEq/L (ref 3.5–5.3)
Total Bilirubin: 0.8 mg/dL (ref 0.3–1.2)
Total Protein: 6.6 g/dL (ref 6.0–8.3)

## 2013-06-24 ENCOUNTER — Encounter: Payer: Self-pay | Admitting: Cardiovascular Disease

## 2013-07-07 ENCOUNTER — Telehealth: Payer: Self-pay | Admitting: Cardiovascular Disease

## 2013-07-07 NOTE — Telephone Encounter (Signed)
Pt is calling in regards to an appointment that was made. Would like for you to call him back.

## 2013-07-08 ENCOUNTER — Telehealth: Payer: Self-pay | Admitting: Cardiovascular Disease

## 2013-07-08 NOTE — Telephone Encounter (Signed)
Message forwarded to [facility]. Berlinda Last, LPN.  Paper chart# [id number] on desk.

## 2013-07-08 NOTE — Telephone Encounter (Signed)
Pt was unaware that he needed to have these dopplers done and he cancelled these appointments and wants to speak to [facility] about this.

## 2013-07-10 ENCOUNTER — Encounter (HOSPITAL_COMMUNITY): Payer: Medicare Other

## 2013-07-10 NOTE — Telephone Encounter (Signed)
Pt. Schedule has been changed for his test. Pt. Called and informed

## 2013-07-11 NOTE — Telephone Encounter (Signed)
i have spoken to this pt. And resolved his issus

## 2013-08-13 ENCOUNTER — Ambulatory Visit (HOSPITAL_COMMUNITY)
Admission: RE | Admit: 2013-08-13 | Discharge: 2013-08-13 | Disposition: A | Discharge status: Home / Self Care | Payer: Medicare Other | Source: Ambulatory Visit | Attending: Cardiovascular Disease | Admitting: Cardiovascular Disease

## 2013-08-13 DIAGNOSIS — R0989 Other specified symptoms and signs involving the circulatory and respiratory systems: Secondary | ICD-10-CM

## 2013-08-13 DIAGNOSIS — I714 Abdominal aortic aneurysm, without rupture, unspecified: Secondary | ICD-10-CM | POA: Insufficient documentation

## 2013-08-13 NOTE — Progress Notes (Signed)
Aorta Duplex Completed. Jaquail Mclees, BS, RDMS, RVT  

## 2013-08-21 ENCOUNTER — Encounter (HOSPITAL_COMMUNITY): Payer: Self-pay

## 2013-12-18 ENCOUNTER — Telehealth: Payer: Self-pay | Admitting: Cardiovascular Disease

## 2013-12-18 MED ORDER — ROSUVASTATIN CALCIUM 40 MG PO TABS: 40.0000 mg | ORAL_TABLET | Freq: Every day | ORAL | Status: DC

## 2013-12-18 NOTE — Telephone Encounter (Signed)
Refills sent to pharmacy. 

## 2013-12-18 NOTE — Telephone Encounter (Signed)
Need refill on Crestor 40 mg #30 or #90

## 2014-01-12 ENCOUNTER — Telehealth: Payer: Self-pay | Admitting: Cardiovascular Disease

## 2014-01-12 NOTE — Telephone Encounter (Signed)
Spoke with patient who is requesting to have lab work done prior to office visit with Dr. Burt Knack in June.  Patient states he is a former patient of Dr. Rollene Fare and lab work was last checked October 2014.  Patient states he will be driving a fairly long distance for his appointment and does not want to return for lab work; wants it all done the same day or orders sent to [facility] in [place].  I reviewed patient's chart and advised that patient come in fasting to a morning appointment and I moved patient's appointment from 6/17 at 2:30 to 6/18 at 0900.  Patient verbalized understanding and agreement.

## 2014-01-12 NOTE — Telephone Encounter (Signed)
New message          Pt will be a new pt with dr cooper and would like to know if you will need blood work before he comes in? Can you please call him

## 2014-02-07 ENCOUNTER — Other Ambulatory Visit: Payer: Self-pay | Admitting: Internal Medicine

## 2014-02-16 ENCOUNTER — Telehealth: Payer: Self-pay | Admitting: *Deleted

## 2014-02-16 NOTE — Telephone Encounter (Signed)
Yes  thx

## 2014-02-16 NOTE — Telephone Encounter (Signed)
Dr Burt Knack, ok to refill crestor 40mg  for patient up until his visit with you scheduled for April 16, 2014? Please advise. Thanks, MI

## 2014-02-17 ENCOUNTER — Other Ambulatory Visit: Payer: Self-pay | Admitting: *Deleted

## 2014-02-17 MED ORDER — ROSUVASTATIN CALCIUM 40 MG PO TABS: 40.0000 mg | ORAL_TABLET | Freq: Every day | ORAL | Status: DC

## 2014-02-17 NOTE — Telephone Encounter (Signed)
Rx sent in. Patient aware.

## 2014-04-15 ENCOUNTER — Ambulatory Visit: Payer: Medicare Other | Admitting: Cardiovascular Disease

## 2014-04-16 ENCOUNTER — Encounter: Payer: Self-pay | Admitting: Cardiovascular Disease

## 2014-04-16 ENCOUNTER — Ambulatory Visit (INDEPENDENT_AMBULATORY_CARE_PROVIDER_SITE_OTHER): Payer: Medicare Other | Admitting: Cardiovascular Disease

## 2014-04-16 VITALS — BP 138/82 | HR 54 | Ht 71.0 in | Wt 264.8 lb

## 2014-04-16 DIAGNOSIS — I2510 Atherosclerotic heart disease of native coronary artery without angina pectoris: Secondary | ICD-10-CM

## 2014-04-16 LAB — CBC
HEMATOCRIT: 50.6 % (ref 39.0–52.0)
HEMOGLOBIN: 17 g/dL (ref 13.0–17.0)
MCHC: 33.7 g/dL (ref 30.0–36.0)
MCV: 90.5 fl (ref 78.0–100.0)
PLATELETS: 383 10*3/uL (ref 150.0–400.0)
RBC: 5.59 Mil/uL (ref 4.22–5.81)
RDW: 15.9 % — AB (ref 11.5–15.5)
WBC: 9.7 10*3/uL (ref 4.0–10.5)

## 2014-04-16 LAB — LIPID PANEL
Cholesterol: 144 mg/dL (ref 0–200)
HDL: 47.1 mg/dL (ref >39.00)
LDL CALC: 75 mg/dL (ref 0–99)
NonHDL: 96.9
Total CHOL/HDL Ratio: 3
Triglycerides: 108 mg/dL (ref 0.0–149.0)
VLDL: 21.6 mg/dL (ref 0.0–40.0)

## 2014-04-16 LAB — BASIC METABOLIC PANEL
BUN: 14 mg/dL (ref 6–23)
CALCIUM: 9.2 mg/dL (ref 8.4–10.5)
CHLORIDE: 105 meq/L (ref 96–112)
CO2: 28 mEq/L (ref 19–32)
CREATININE: 1 mg/dL (ref 0.4–1.5)
GFR: 78.01 mL/min (ref >60.00)
GLUCOSE: 104 mg/dL — AB (ref 70–99)
POTASSIUM: 4.8 meq/L (ref 3.5–5.1)
SODIUM: 139 meq/L (ref 135–145)

## 2014-04-16 LAB — HEPATIC FUNCTION PANEL
ALBUMIN: 3.9 g/dL (ref 3.5–5.2)
ALT: 24 U/L (ref 0–53)
AST: 24 U/L (ref 0–37)
Alkaline Phosphatase: 67 U/L (ref 39–117)
BILIRUBIN DIRECT: 0.1 mg/dL (ref 0.0–0.3)
BILIRUBIN TOTAL: 0.8 mg/dL (ref 0.2–1.2)
Total Protein: 6.9 g/dL (ref 6.0–8.3)

## 2014-04-16 LAB — TSH: TSH: 1.49 u[IU]/mL (ref 0.35–4.50)

## 2014-04-16 NOTE — Progress Notes (Signed)
HPI:  68 year old gentleman presenting for cardiac evaluation. The patient has been followed by Dr. Rollene Fare. He has coronary artery disease and underwent four-vessel CABG in 2005 with the LIMA to LAD, left radial artery to RCA, saphenous vein graft to ramus intermediate, and saphenous vein graft to left circumflex marginal. He presented at that time with symptoms of non-ST elevation infarction. He was last seen by Dr. Rollene Fare in August 2014, at which time he was quite stable. His last echocardiogram from January 2014 showed normal left ventricular size and systolic function with an ejection fraction of 55-60%. There was no significant valvular disease noted. He also had an abdominal aortic ultrasound which showed a normal abdominal aorta with no evidence for aneurysm. He has had some issues with certain statin drugs, but has been able to take Crestor. His last lipids from August 2014 showed a cholesterol of 165, triglycerides 113, HDL cholesterol 52, and LDL cholesterol 90.  The patient has been doing well from a cardiac perspective. He denies chest pain, chest pressure, shortness of breath, edema, or heart palpitations. He denies leg swelling. He exercises sporadically. He walks about 20 minutes on his treadmill without symptoms. He continues to struggle with weight loss.  Outpatient Encounter Prescriptions as of 04/16/2014  Medication Sig  . aspirin 81 MG tablet Take 81 mg by mouth daily.  Marland Kitchen atenolol (TENORMIN) 25 MG tablet Take 25 mg by mouth daily.  . Omega-3 Fatty Acids (FISH OIL) 1000 MG CAPS Take 1,000 mg by mouth 1 day or 1 dose.  . rosuvastatin (CRESTOR) 40 MG tablet Take 40 mg by mouth daily. Takes one 40mg  tablet on Monday/ Wednesday/Friday, on Tuesday/ Thursday/Saturday/Sunday he takes one half of a 40 mg tablet.  . [DISCONTINUED] triamcinolone cream (KENALOG) 0.1 %   . [DISCONTINUED] HYDROcodone-acetaminophen (NORCO/VICODIN) 5-325 MG per tablet   . [DISCONTINUED] rosuvastatin  (CRESTOR) 40 MG tablet Take 1 tablet (40 mg total) by mouth daily.    Zocor  Past Medical History  Diagnosis Date  . Coronary atherosclerosis of native coronary artery     Vessel CABG 2005  . Hypertension   . Hyperlipidemia     History reviewed. No pertinent past surgical history.  History   Social History  . Marital Status: Married    Spouse Name: N/A    Number of Children: N/A  . Years of Education: N/A   Occupational History  . Not on file.   Social History Main Topics  . Smoking status: Former Smoker    Quit date: 04/17/1979  . Smokeless tobacco: Not on file  . Alcohol Use: Not on file  . Drug Use: Not on file  . Sexual Activity: Not on file   Other Topics Concern  . Not on file   Social History Narrative   The patient is married. He [facility] at [facility]. Retired since 2012. Remote smoker.    ROS:  General: no fevers/chills/night sweats Eyes: no blurry vision, diplopia, or amaurosis ENT: no sore throat or hearing loss Resp: no cough, wheezing, or hemoptysis CV: no edema or palpitations GI: no abdominal pain, nausea, vomiting, diarrhea, or constipation GU: no dysuria, frequency, or hematuria Skin: no rash Neuro: no headache, numbness, tingling, or weakness of extremities Musculoskeletal: no joint pain or swelling Heme: no bleeding, DVT, or easy bruising Endo: no polydipsia or polyuria  BP 138/82  Pulse 54  Ht 5\' 11"  (1.803 m)  Wt 120.112 kg (264 lb 12.8 oz)  BMI 36.95 kg/m2  PHYSICAL EXAM: Pt  is alert and oriented, WD, WN, pleasant overweight male in no distress. HEENT: normal Neck: JVP normal. Carotid upstrokes normal without bruits. No thyromegaly. Lungs: equal expansion, clear bilaterally CV: Apex is discrete and nondisplaced, bradycardic and regular without murmur or gallop Abd: soft, NT, +BS, no bruit, no hepatosplenomegaly Back: no CVA tenderness Ext: no C/C/E        DP/PT pulses intact and = Skin: warm and dry without rash Neuro:  CNII-XII intact             Strength intact = bilaterally  EKG:  Sinus bradycardia 54 beats per minute, first degree AV block, otherwise within normal limits.  ASSESSMENT AND PLAN: 1. Coronary artery disease, native vessel. The patient is stable without symptoms of angina. He is 10 years out from bypass surgery. He remains on aspirin, beta blocker, and a statin drug. We discussed the importance of diet and exercise with goals for weight loss. I would like him to have an exercise treadmill study next year prior to his return office visit. He understands to seek immediate medical attention if he has recurrence of any anginal symptoms. His previous anginal equivalent has been chest pain radiating down the left arm.  2. Hypertension. Blood pressure well controlled on atenolol. Needs to work on weight loss.  3. Hyperlipidemia. Most recent lipids reviewed as above. The patient is alternating Crestor 40 mg with Crestor 20 mg every other day. We'll repeat lipids and LFTs today.    Sherren Mocha 04/16/2014 1:41 PM

## 2014-04-16 NOTE — Patient Instructions (Signed)
Your physician recommends that you have lab work today: CBC, BMP, LIVER, LIPID and TSH  Your physician has requested that you have an exercise tolerance test in 11 MONTHS with PA/NP. For further information please visit [email or web address]. Please also follow instruction sheet, as given.  Your physician wants you to follow-up in: 1 YEAR with Dr Burt Knack.  You will receive a reminder letter in the mail two months in advance. If you don't receive a letter, please call our office to schedule the follow-up appointment.  Your physician recommends that you continue on your current medications as directed. Please refer to the Current Medication list given to you today.

## 2014-04-25 ENCOUNTER — Other Ambulatory Visit: Payer: Self-pay | Admitting: Cardiovascular Disease

## 2014-04-27 ENCOUNTER — Other Ambulatory Visit: Payer: Self-pay

## 2014-04-27 ENCOUNTER — Other Ambulatory Visit: Payer: Self-pay | Admitting: Cardiovascular Disease

## 2014-05-06 ENCOUNTER — Other Ambulatory Visit: Payer: Self-pay

## 2014-05-06 MED ORDER — ATENOLOL 25 MG PO TABS: 25.0000 mg | ORAL_TABLET | Freq: Every day | ORAL | Status: DC

## 2014-07-15 ENCOUNTER — Encounter: Payer: Self-pay | Admitting: Internal Medicine

## 2014-07-29 ENCOUNTER — Other Ambulatory Visit: Payer: Self-pay | Admitting: Cardiovascular Disease

## 2014-08-29 ENCOUNTER — Other Ambulatory Visit: Payer: Self-pay | Admitting: Cardiovascular Disease

## 2014-10-13 ENCOUNTER — Other Ambulatory Visit: Payer: Self-pay | Admitting: Cardiovascular Disease

## 2014-10-30 DIAGNOSIS — H269 Unspecified cataract: Secondary | ICD-10-CM

## 2014-10-30 HISTORY — DX: Unspecified cataract: H26.9

## 2015-01-07 ENCOUNTER — Telehealth: Payer: Self-pay | Admitting: Cardiovascular Disease

## 2015-01-07 DIAGNOSIS — E785 Hyperlipidemia, unspecified: Secondary | ICD-10-CM

## 2015-01-07 DIAGNOSIS — I10 Essential (primary) hypertension: Secondary | ICD-10-CM

## 2015-01-07 NOTE — Telephone Encounter (Signed)
New Message    The patient would like to get his blood work done by [facility] in [place] and then get his lab work sent her before his appt on April 19 2015 @ 915am. Please give patient a call to discuss.

## 2015-01-07 NOTE — Telephone Encounter (Signed)
I spoke with the pt and he will have his labs checked when he comes into the office for 03/16/15 GXT. Lab appointment scheduled.

## 2015-03-15 ENCOUNTER — Encounter (INDEPENDENT_AMBULATORY_CARE_PROVIDER_SITE_OTHER): Payer: Medicare Other | Admitting: Physician Assistant

## 2015-03-15 ENCOUNTER — Other Ambulatory Visit (INDEPENDENT_AMBULATORY_CARE_PROVIDER_SITE_OTHER): Payer: Medicare Other | Admitting: *Deleted

## 2015-03-15 DIAGNOSIS — I10 Essential (primary) hypertension: Secondary | ICD-10-CM

## 2015-03-15 DIAGNOSIS — E785 Hyperlipidemia, unspecified: Secondary | ICD-10-CM

## 2015-03-15 DIAGNOSIS — I2510 Atherosclerotic heart disease of native coronary artery without angina pectoris: Secondary | ICD-10-CM | POA: Diagnosis not present

## 2015-03-15 LAB — EXERCISE TOLERANCE TEST
CHL CUP STRESS STAGE 1 DBP: 78 mmHg
CHL CUP STRESS STAGE 1 GRADE: 0 %
CHL CUP STRESS STAGE 1 HR: 62 {beats}/min
CHL CUP STRESS STAGE 1 SBP: 117 mmHg
CHL CUP STRESS STAGE 3 HR: 68 {beats}/min
CHL CUP STRESS STAGE 3 SPEED: 1 mph
CHL CUP STRESS STAGE 4 DBP: 84 mmHg
CHL CUP STRESS STAGE 4 GRADE: 10 %
CHL CUP STRESS STAGE 4 SPEED: 1.7 mph
CHL CUP STRESS STAGE 5 SBP: 161 mmHg
CHL CUP STRESS STAGE 5 SPEED: 2.5 mph
CHL CUP STRESS STAGE 6 GRADE: 0 %
CHL CUP STRESS STAGE 6 HR: 96 {beats}/min
CHL CUP STRESS STAGE 6 SBP: 152 mmHg
CHL CUP STRESS STAGE 7 DBP: 78 mmHg
CHL CUP STRESS STAGE 7 HR: 67 {beats}/min
CHL CUP STRESS STAGE 7 SBP: 126 mmHg
CHL RATE OF PERCEIVED EXERTION: 15
CSEPED: 6 min
CSEPEW: 7 METS
CSEPHR: 74 %
CSEPPBP: 161 mmHg
CSEPPHR: 112 {beats}/min
CSEPPMHR: 74 %
MPHR: 151 {beats}/min
Rest HR: 57 {beats}/min
Stage 1 Speed: 0 mph
Stage 2 Grade: 0 %
Stage 2 HR: 67 {beats}/min
Stage 2 Speed: 1 mph
Stage 3 Grade: 0 %
Stage 4 HR: 94 {beats}/min
Stage 4 SBP: 151 mmHg
Stage 5 DBP: 99 mmHg
Stage 5 Grade: 12 %
Stage 5 HR: 112 {beats}/min
Stage 6 DBP: 88 mmHg
Stage 6 Speed: 1.5 mph
Stage 7 Grade: 0 %
Stage 7 Speed: 0 mph

## 2015-03-15 LAB — CBC
HEMATOCRIT: 51.9 % (ref 39.0–52.0)
HEMOGLOBIN: 17.4 g/dL — AB (ref 13.0–17.0)
MCHC: 33.5 g/dL (ref 30.0–36.0)
MCV: 89.7 fl (ref 78.0–100.0)
Platelets: 365 10*3/uL (ref 150.0–400.0)
RBC: 5.78 Mil/uL (ref 4.22–5.81)
RDW: 15.9 % — AB (ref 11.5–15.5)
WBC: 11.4 10*3/uL — AB (ref 4.0–10.5)

## 2015-03-15 LAB — LIPID PANEL
CHOLESTEROL: 151 mg/dL (ref 0–200)
HDL: 47.6 mg/dL (ref >39.00)
LDL Cholesterol: 79 mg/dL (ref 0–99)
NONHDL: 103.4
Total CHOL/HDL Ratio: 3
Triglycerides: 122 mg/dL (ref 0.0–149.0)
VLDL: 24.4 mg/dL (ref 0.0–40.0)

## 2015-03-15 LAB — BASIC METABOLIC PANEL
BUN: 18 mg/dL (ref 6–23)
CALCIUM: 9.6 mg/dL (ref 8.4–10.5)
CO2: 31 meq/L (ref 19–32)
CREATININE: 0.93 mg/dL (ref 0.40–1.50)
Chloride: 105 mEq/L (ref 96–112)
GFR: 85.58 mL/min (ref >60.00)
GLUCOSE: 99 mg/dL (ref 70–99)
Potassium: 5 mEq/L (ref 3.5–5.1)
Sodium: 139 mEq/L (ref 135–145)

## 2015-03-15 LAB — HEPATIC FUNCTION PANEL
ALBUMIN: 3.7 g/dL (ref 3.5–5.2)
ALT: 22 U/L (ref 0–53)
AST: 21 U/L (ref 0–37)
Alkaline Phosphatase: 64 U/L (ref 39–117)
Bilirubin, Direct: 0.1 mg/dL (ref 0.0–0.3)
TOTAL PROTEIN: 6.6 g/dL (ref 6.0–8.3)
Total Bilirubin: 0.6 mg/dL (ref 0.2–1.2)

## 2015-03-15 LAB — TSH: TSH: 2.4 u[IU]/mL (ref 0.35–4.50)

## 2015-03-16 ENCOUNTER — Encounter: Payer: Medicare Other | Admitting: Physician Assistant

## 2015-03-16 ENCOUNTER — Other Ambulatory Visit: Payer: Medicare Other

## 2015-04-15 ENCOUNTER — Encounter: Payer: Self-pay | Admitting: *Deleted

## 2015-04-19 ENCOUNTER — Encounter: Payer: Self-pay | Admitting: Cardiovascular Disease

## 2015-04-19 ENCOUNTER — Ambulatory Visit (INDEPENDENT_AMBULATORY_CARE_PROVIDER_SITE_OTHER): Payer: Medicare Other | Admitting: Cardiovascular Disease

## 2015-04-19 VITALS — BP 128/78 | HR 57 | Ht 71.0 in | Wt 268.0 lb

## 2015-04-19 DIAGNOSIS — D72829 Elevated white blood cell count, unspecified: Secondary | ICD-10-CM

## 2015-04-19 DIAGNOSIS — I2510 Atherosclerotic heart disease of native coronary artery without angina pectoris: Secondary | ICD-10-CM | POA: Diagnosis not present

## 2015-04-19 DIAGNOSIS — E785 Hyperlipidemia, unspecified: Secondary | ICD-10-CM | POA: Diagnosis not present

## 2015-04-19 LAB — CBC
HCT: 52.6 % — ABNORMAL HIGH (ref 39.0–52.0)
Hemoglobin: 17.3 g/dL — ABNORMAL HIGH (ref 13.0–17.0)
MCHC: 32.9 g/dL (ref 30.0–36.0)
MCV: 91.5 fl (ref 78.0–100.0)
PLATELETS: 400 10*3/uL (ref 150.0–400.0)
RBC: 5.75 Mil/uL (ref 4.22–5.81)
RDW: 16 % — ABNORMAL HIGH (ref 11.5–15.5)
WBC: 11.4 10*3/uL — ABNORMAL HIGH (ref 4.0–10.5)

## 2015-04-19 NOTE — Progress Notes (Signed)
Cardiology Office Note   Date:  04/19/2015   ID:  Leonard Washington, DOB 09/08/1946, MRN [id number]  PCP:  Cyndy Freeze, MD  Cardiologist:  Sherren Mocha, MD    Chief Complaint  Patient presents with  . Shortness of Breath    pt has been waking up at night and cannot catch his breath     History of Present Illness: Leonard Washington is a 69 y.o. male who presents for follow-up evaluation.  He has coronary artery disease and underwent four-vessel CABG in 2005 with the LIMA to LAD, left radial artery to RCA, saphenous vein graft to ramus intermediate, and saphenous vein graft to left circumflex marginal. He presented at that time with symptoms of non-ST elevation infarction. He was last seen one year ago at which time his cardiac status was stable. His last echocardiogram from January 2014 showed normal left ventricular size and systolic function with an ejection fraction of 55-60%. There was no significant valvular disease noted. He also had an abdominal aortic ultrasound which showed a normal abdominal aorta with no evidence for aneurysm. He has had some issues with certain statin drugs, but has been able to take Crestor.  He's walking 1 1/2 miles 3-4 days per week on the treadmill at a 4 mph pace.  The patient has no symptoms with that level of activity. He did an exercise treadmill study last month and was only able to walk for 6 minutes , limited by shortness of breath. He did not have any EKG changes. He achieved 74% of his maximum predicted heart rate. He denies leg swelling, palpitations , lightheadedness, or syncope. Occasionally he wakes up at night and feels short of breath. This only happens about every other month. He finishes the night in a chair. His ischemic symptoms in the past were chest discomfort radiating to the left arm.   Past Medical History  Diagnosis Date  . Coronary atherosclerosis of native coronary artery     Vessel CABG 2005  . Hypertension   . Hyperlipidemia      No past surgical history on file.  Current Outpatient Prescriptions  Medication Sig Dispense Refill  . aspirin 81 MG tablet Take 81 mg by mouth daily.    Marland Kitchen atenolol (TENORMIN) 25 MG tablet TAKE 1 TABLET BY MOUTH EVERY DAY 30 tablet 5  . CRESTOR 40 MG tablet TAKE 1 TABLET BY MOUTH EVERY DAY 30 tablet 6  . Omega-3 Fatty Acids (FISH OIL) 1000 MG CAPS Take 1,000 mg by mouth 1 day or 1 dose.    . triamcinolone cream (KENALOG) 0.1 % Apply 1 application topically as needed.  4   No current facility-administered medications for this visit.    Allergies:   Zocor   Social History:  The patient  reports that he quit smoking about 36 years ago. He does not have any smokeless tobacco history on file.   Family History:  The patient's  family history includes Heart attack in his maternal uncle, maternal uncle, and maternal uncle.    ROS:  Please see the history of present illness.  Otherwise, review of systems is positive for  Waking up at night short of breath, hearing loss, easy bruising, constipation.  All other systems are reviewed and negative.    PHYSICAL EXAM: VS:  BP 128/78 mmHg  Pulse 57  Ht 5\' 11"  (1.803 m)  Wt 268 lb (121.564 kg)  BMI 37.39 kg/m2 , BMI Body mass index is 37.39 kg/(m^2). GEN: Well nourished, well developed,  in no acute distress HEENT: normal Neck: no JVD, no masses. No carotid bruits Cardiac: RRR without murmur or gallop                Respiratory:  clear to auscultation bilaterally, normal work of breathing GI: soft, nontender, nondistended, + BS MS: no deformity or atrophy Ext: no pretibial edema, pedal pulses 2+= bilaterally Skin: warm and dry, no rash Neuro:  Strength and sensation are intact Psych: euthymic mood, full affect  EKG:  EKG is ordered today. The ekg ordered today shows  Sinus bradycardia 57 bpm, first-degree AV block, otherwise within normal limits.  Recent Labs: 03/15/2015: ALT 22; BUN 18; Creatinine, Ser 0.93; Potassium 5.0; Sodium 139;  TSH 2.40 04/19/2015: Hemoglobin 17.3*; Platelets 400.0   Lipid Panel     Component Value Date/Time   CHOL 151 03/15/2015 0834   TRIG 122.0 03/15/2015 0834   HDL 47.60 03/15/2015 0834   CHOLHDL 3 03/15/2015 0834   VLDL 24.4 03/15/2015 0834   LDLCALC 79 03/15/2015 0834      Wt Readings from Last 3 Encounters:  04/19/15 268 lb (121.564 kg)  04/16/14 264 lb 12.8 oz (120.112 kg)    Exercise Stress Test 03/15/2015: Stress Measurements    Baseline Vitals  Rest HR 57 bpm    Rest BP 117/78 mmHg    Exercise Time  Exercise duration (min) 6 min    Peak Stress Vitals  Post peak HR 112 BPM    Post peak BP 161 mmHg    Exercise Data  MPHR 151 bpm    Percent HR 74 %    RPE 15     Estimated workload 7 METS         Conclusion     There was no ST segment deviation noted during stress.   ASSESSMENT AND PLAN: 1.  CAD, native vessel:  No symptoms of angina. Continue current medical therapy. Since he has no exertional chest pain or pressure, I think his exercise treadmill study is adequate and no indication to proceed with a nuclear scan at this point. He is now 11 years out from CABG. He will return in one year for follow-up unless problems arise.  2. Essential HTN: blood pressure well-controlled on current Rx.  3. Hyperlipidemia: last lipids reviewed as above with LDL 79. Tolerating crestor at current dose.    4. Obesity: He is starting a weight loss program. He reviewed the diet and it consists of lean meats, vegetables, and fruit. He was encouraged to follow through with this.  5. Leukocytosis: Noted incidentally on recent labs. Will repeat. No signs of infection.  Current medicines are reviewed with the patient today.  The patient does not have concerns regarding medicines.  Labs/ tests ordered today include:   Orders Placed This Encounter  Procedures  . CBC  . EKG 12-Lead    Disposition:   FU one year  Signed, Sherren Mocha, MD  04/19/2015 5:50 PM    [facility] [place], [place], [place]  [address] Phone: [phone]; Fax: [phone]

## 2015-04-19 NOTE — Patient Instructions (Signed)
Medication Instructions:  Your physician recommends that you continue on your current medications as directed. Please refer to the Current Medication list given to you today.  Labwork: Your physician recommends that you have lab work today: CBC  Testing/Procedures: No new orders.   Follow-Up: Your physician wants you to follow-up in: 1 YEAR with Dr Burt Knack.  You will receive a reminder letter in the mail two months in advance. If you don't receive a letter, please call our office to schedule the follow-up appointment.   Any Other Special Instructions Will Be Listed Below (If Applicable).

## 2015-05-02 ENCOUNTER — Other Ambulatory Visit: Payer: Self-pay | Admitting: Cardiovascular Disease

## 2015-05-04 ENCOUNTER — Telehealth: Payer: Self-pay | Admitting: Cardiovascular Disease

## 2015-05-04 NOTE — Telephone Encounter (Signed)
Left message on machine for pt to contact the office.   

## 2015-05-04 NOTE — Telephone Encounter (Signed)
New Message   Pt wants to talk to dr about his diet that he is on is causing his BP to be high   Pt states that he stopped the medication Atenolol 25mg   7/5   118/75  7/4 109/66  7/3  115/74

## 2015-05-04 NOTE — Telephone Encounter (Signed)
I spoke with the pt and he has lost 15 lbs in 2 weeks on a high protein diet and increased vegetables. The pt's BP has started to drop so he stopped his Atenolol and wanted to make sure this was okay.  I made the pt aware that we also need to know what his pulse is running off this medication.  The pt has not been keeping track of this information. The pt will continue to monitor his BP and pulse off of Atenolol and call the office in a week with readings.  We will give further guidance at that time. Pt agreed with plan.

## 2015-06-14 ENCOUNTER — Other Ambulatory Visit: Payer: Self-pay

## 2015-06-14 ENCOUNTER — Telehealth: Payer: Self-pay

## 2015-06-14 MED ORDER — ATENOLOL 25 MG PO TABS: 25.0000 mg | ORAL_TABLET | Freq: Every day | ORAL | Status: DC

## 2015-06-14 NOTE — Telephone Encounter (Signed)
Sherren Mocha, MD at 04/19/2015 9:21 AM  atenolol (TENORMIN) 25 MG tablet TAKE 1 TABLET BY MOUTH EVERY DAY        Medication Instructions:  Your physician recommends that you continue on your current medications as directed. Please refer to the Current Medication list given to you today.

## 2015-06-15 NOTE — Telephone Encounter (Signed)
Atenolol okay to refill.

## 2015-06-22 ENCOUNTER — Other Ambulatory Visit: Payer: Self-pay | Admitting: Cardiovascular Disease

## 2015-06-25 ENCOUNTER — Encounter: Payer: Self-pay | Admitting: Cardiovascular Disease

## 2016-01-28 DIAGNOSIS — R351 Nocturia: Secondary | ICD-10-CM | POA: Diagnosis not present

## 2016-01-28 DIAGNOSIS — C673 Malignant neoplasm of anterior wall of bladder: Secondary | ICD-10-CM | POA: Diagnosis not present

## 2016-01-28 DIAGNOSIS — N401 Enlarged prostate with lower urinary tract symptoms: Secondary | ICD-10-CM | POA: Diagnosis not present

## 2016-02-01 DIAGNOSIS — J019 Acute sinusitis, unspecified: Secondary | ICD-10-CM | POA: Diagnosis not present

## 2016-02-01 DIAGNOSIS — T753XXA Motion sickness, initial encounter: Secondary | ICD-10-CM | POA: Diagnosis not present

## 2016-02-01 DIAGNOSIS — H6122 Impacted cerumen, left ear: Secondary | ICD-10-CM | POA: Diagnosis not present

## 2016-02-01 DIAGNOSIS — B9689 Other specified bacterial agents as the cause of diseases classified elsewhere: Secondary | ICD-10-CM | POA: Diagnosis not present

## 2016-04-20 DIAGNOSIS — L501 Idiopathic urticaria: Secondary | ICD-10-CM | POA: Diagnosis not present

## 2016-04-20 DIAGNOSIS — D485 Neoplasm of uncertain behavior of skin: Secondary | ICD-10-CM | POA: Diagnosis not present

## 2016-06-27 ENCOUNTER — Encounter: Payer: Self-pay | Admitting: Cardiovascular Disease

## 2016-06-28 ENCOUNTER — Other Ambulatory Visit: Payer: Self-pay | Admitting: Cardiovascular Disease

## 2016-06-28 ENCOUNTER — Other Ambulatory Visit: Payer: Self-pay | Admitting: *Deleted

## 2016-06-28 MED ORDER — ROSUVASTATIN CALCIUM 40 MG PO TABS: 40.0000 mg | ORAL_TABLET | Freq: Every day | ORAL | 0 refills | Status: DC

## 2016-07-13 ENCOUNTER — Encounter (INDEPENDENT_AMBULATORY_CARE_PROVIDER_SITE_OTHER): Payer: Self-pay

## 2016-07-13 ENCOUNTER — Encounter: Payer: Self-pay | Admitting: Cardiovascular Disease

## 2016-07-13 ENCOUNTER — Ambulatory Visit (INDEPENDENT_AMBULATORY_CARE_PROVIDER_SITE_OTHER): Payer: Medicare Other | Admitting: Cardiovascular Disease

## 2016-07-13 VITALS — BP 124/80 | HR 64 | Ht 71.0 in | Wt 256.0 lb

## 2016-07-13 DIAGNOSIS — I10 Essential (primary) hypertension: Secondary | ICD-10-CM | POA: Diagnosis not present

## 2016-07-13 DIAGNOSIS — I2510 Atherosclerotic heart disease of native coronary artery without angina pectoris: Secondary | ICD-10-CM | POA: Diagnosis not present

## 2016-07-13 DIAGNOSIS — R0600 Dyspnea, unspecified: Secondary | ICD-10-CM | POA: Diagnosis not present

## 2016-07-13 DIAGNOSIS — E785 Hyperlipidemia, unspecified: Secondary | ICD-10-CM

## 2016-07-13 DIAGNOSIS — Z79899 Other long term (current) drug therapy: Secondary | ICD-10-CM | POA: Diagnosis not present

## 2016-07-13 LAB — HEMOGLOBIN A1C
Hgb A1c MFr Bld: 5 % (ref <5.7)
Mean Plasma Glucose: 97 mg/dL

## 2016-07-13 LAB — CBC
HEMATOCRIT: 57.1 % — AB (ref 38.5–50.0)
HEMOGLOBIN: 19.5 g/dL — AB (ref 13.2–17.1)
MCH: 31.8 pg (ref 27.0–33.0)
MCHC: 35.2 g/dL (ref 32.0–36.0)
MCV: 93 fL (ref 80.0–100.0)
MPV: 9.9 fL (ref 7.5–12.5)
Platelets: 355 10*3/uL (ref 140–400)
RBC: 6.14 MIL/uL — AB (ref 4.20–5.80)
RDW: 14.7 % (ref 11.0–15.0)
WBC: 11 10*3/uL — AB (ref 3.8–10.8)

## 2016-07-13 LAB — LIPID PANEL
CHOL/HDL RATIO: 3.1 ratio (ref <=5.0)
Cholesterol: 156 mg/dL (ref 125–200)
HDL: 50 mg/dL (ref >=40)
LDL CALC: 85 mg/dL (ref <130)
Triglycerides: 104 mg/dL (ref <150)
VLDL: 21 mg/dL (ref <30)

## 2016-07-13 LAB — COMPREHENSIVE METABOLIC PANEL
ALT: 18 U/L (ref 9–46)
AST: 20 U/L (ref 10–35)
Albumin: 4.2 g/dL (ref 3.6–5.1)
Alkaline Phosphatase: 65 U/L (ref 40–115)
BUN: 15 mg/dL (ref 7–25)
CO2: 29 mmol/L (ref 20–31)
Calcium: 9.6 mg/dL (ref 8.6–10.3)
Chloride: 103 mmol/L (ref 98–110)
Creat: 1.02 mg/dL (ref 0.70–1.18)
GLUCOSE: 88 mg/dL (ref 65–99)
Potassium: 5.2 mmol/L (ref 3.5–5.3)
SODIUM: 141 mmol/L (ref 135–146)
Total Bilirubin: 1 mg/dL (ref 0.2–1.2)
Total Protein: 6.6 g/dL (ref 6.1–8.1)

## 2016-07-13 MED ORDER — ROSUVASTATIN CALCIUM 40 MG PO TABS: 40.0000 mg | ORAL_TABLET | Freq: Every day | ORAL | 3 refills | Status: DC

## 2016-07-13 MED ORDER — ATENOLOL 25 MG PO TABS: 25.0000 mg | ORAL_TABLET | Freq: Every day | ORAL | 3 refills | Status: DC

## 2016-07-13 NOTE — Patient Instructions (Signed)
Medication Instructions:  Your physician recommends that you continue on your current medications as directed. Please refer to the Current Medication list given to you today.  Labwork: Your physician recommends that you have lab work today: CBC, HgbA1c, LIPID and CMP  Testing/Procedures: Your physician has requested that you have an echocardiogram. Echocardiography is a painless test that uses sound waves to create images of your heart. It provides your doctor with information about the size and shape of your heart and how well your heart's chambers and valves are working. This procedure takes approximately one hour. There are no restrictions for this procedure.  Follow-Up: Your physician wants you to follow-up in: 1 YEAR with Dr Burt Knack.  You will receive a reminder letter in the mail two months in advance. If you don't receive a letter, please call our office to schedule the follow-up appointment.   Any Other Special Instructions Will Be Listed Below (If Applicable).     If you need a refill on your cardiac medications before your next appointment, please call your pharmacy.

## 2016-07-13 NOTE — Progress Notes (Signed)
Cardiology Office Note Date:  07/13/2016   ID:  Leonard Washington, DOB 03-07-1946, MRN [id number]  PCP:  Cyndy Freeze, MD  Cardiologist:  Sherren Mocha, MD    Chief Complaint  Patient presents with  . Coronary Artery Disease     History of Present Illness: Leonard Washington is a 70 y.o. male who presents for follow-up evaluation.  He has coronary artery disease and underwent four-vessel CABG in 2005 with the LIMA to LAD, left radial artery to RCA, saphenous vein graft to ramus intermediate, and saphenous vein graft to left circumflex marginal. He presented at that time with symptoms of non-ST elevation infarction. He was last seen one year ago at which time his cardiac status was stable. His last echocardiogram from January 2014 showed normal left ventricular size and systolic function with an ejection fraction of 55-60%. There was no significant valvular disease noted. He also had an abdominal aortic ultrasound which showed a normal abdominal aorta with no evidence for aneurysm. He has had some issues with certain statin drugs, but has been able to take Crestor.  The patient occasionally wakes up at night short of breath. He does not have orthopnea. He denies exertional dyspnea but he is not exercising on a regular basis. He's had no chest pain or pressure. He denies edema or heart palpitations. He denies symptoms of daytime somnolence. He does not snore.  Past Medical History:  Diagnosis Date  . Coronary atherosclerosis of native coronary artery    Vessel CABG 2005  . Hyperlipidemia   . Hypertension     Past Surgical History:  Procedure Laterality Date  . ARTERIAL DUPLEX  04/19/2004   Carotid-no evidence of significant plaque noted, no evidence of significant ICA stenosis, artery flow is antegrade. Upper extremities-within normal limits. Lower extremities-no evidence of lower extremity arterial occlusive disease at rest  . CARDIAC CATHETERIZATION  04/19/2004   severe four vessel coronary  disease, continue vigorous medical therapy, recommendations for multivessel bypass grafting  . CARDIOVASCULAR STRESS TEST  07/01/2009   normal pattern of perfusion in all regions, no scintigraphic evidence of inducible myocardial ischemia,EKG negative for ischemia, no ECG changes,  . CORONARY ARTERY BYPASS GRAFT  04/20/2004   x4. LIMA to LAD, left radial artery to RCA, SVG to ramus intermedius, SVG to circ.  Marland Kitchen DOPPLER ECHOCARDIOGRAPHY  11/11/2012   EF 55-60%, wall motion normal, no regional wall motion abnormalities, thickness of ventricular septum was mildly increased, mild regurg of the mitral valve    Current Outpatient Prescriptions  Medication Sig Dispense Refill  . aspirin 81 MG tablet Take 81 mg by mouth daily.    Marland Kitchen atenolol (TENORMIN) 25 MG tablet Take 1 tablet (25 mg total) by mouth daily. 90 tablet 3  . Omega-3 Fatty Acids (FISH OIL) 1000 MG CAPS Take 1,000 mg by mouth daily.     . rosuvastatin (CRESTOR) 40 MG tablet Take 1 tablet (40 mg total) by mouth daily. 90 tablet 3  . triamcinolone cream (KENALOG) 0.1 % Apply 1 application topically as needed (itching).   4   No current facility-administered medications for this visit.     Allergies:   Zocor [simvastatin]   Social History:  The patient  reports that he quit smoking about 37 years ago. He has quit using smokeless tobacco. He reports that he does not drink alcohol or use drugs.   Family History:  The patient's  family history includes Heart attack in his maternal grandfather, maternal uncle, maternal uncle, and maternal uncle.  ROS:  Please see the history of present illness.   All other systems are reviewed and negative.    PHYSICAL EXAM: VS:  BP 124/80   Pulse 64   Ht '5\' 11"'  (1.803 m)   Wt 116.1 kg (256 lb)   BMI 35.70 kg/m  , BMI Body mass index is 35.7 kg/m. GEN: Well nourished, well developed, in no acute distress  HEENT: normal  Neck: no JVD, no masses. No carotid bruits Cardiac: RRR without murmur or  gallop                Respiratory:  clear to auscultation bilaterally, normal work of breathing GI: soft, nontender, nondistended, + BS MS: no deformity or atrophy  Ext: no pretibial edema, pedal pulses 2+= bilaterally Skin: warm and dry, no rash Neuro:  Strength and sensation are intact Psych: euthymic mood, full affect  EKG:  EKG is ordered today. The ekg ordered today shows normal sinus rhythm, first-degree AV block, otherwise within normal limits. No significant change from last year's tracing.  Recent Labs: No results found for requested labs within last 8760 hours.   Lipid Panel     Component Value Date/Time   CHOL 151 03/15/2015 0834   TRIG 122.0 03/15/2015 0834   HDL 47.60 03/15/2015 0834   CHOLHDL 3 03/15/2015 0834   VLDL 24.4 03/15/2015 0834   LDLCALC 79 03/15/2015 0834      Wt Readings from Last 3 Encounters:  07/13/16 116.1 kg (256 lb)  04/19/15 121.6 kg (268 lb)  04/16/14 120.1 kg (264 lb 12.8 oz)     Cardiac Studies Reviewed: Exercise Stress Test 03/15/2015:     Stress Measurements       Baseline Vitals  Rest HR 57 bpm    Rest BP 117/78 mmHg    Exercise Time  Exercise duration (min) 6 min    Peak Stress Vitals  Post peak HR 112 BPM    Post peak BP 161 mmHg    Exercise Data  MPHR 151 bpm    Percent HR 74 %    RPE 15     Estimated workload 7 METS         Conclusion     There was no ST segment deviation noted during stress.    ASSESSMENT AND PLAN: 1.  CAD, native vessel, without symptoms of angina: Medications are reviewed and will be continued. The patient is 12 years out from CABG. we discussed lifestyle modification, diet, and exercise. He will follow-up in one year.  2. Essential hypertension: blood pressure is controlled. Patient takes atenolol.  3. Hyperlipidemia: Patient is tolerating CrestoWill check fasting labs this morning.  4. PND: Unclear etiology. He has no other symptoms of obstructive sleep apnea. Will  check an echocardiogram to evaluate for systolic or diastolic dysfunction. Discussed the importance of weight loss.  Reent medicines are reviewed with the patient today.  The patient does not have concerns regarding medicines.  Labs/ tests ordered today include:   Orders Placed This Encounter  Procedures  . Lipid panel  . Comp Met (CMET)  . HgB A1c  . CBC  . EKG 12-Lead  . ECHOCARDIOGRAM COMPLETE    Disposition:   FU one year  Signed, Sherren Mocha, MD  07/13/2016 8:40 AM    [facility] [place], [place], [place]  [address] Phone: [phone]; Fax: [phone]

## 2016-07-25 ENCOUNTER — Encounter: Payer: Self-pay | Admitting: Cardiovascular Disease

## 2016-07-25 NOTE — Telephone Encounter (Signed)
This encounter was created in error - please disregard.

## 2016-07-25 NOTE — Telephone Encounter (Signed)
New message ° ° ° ° °Returning a call to the nurse °

## 2016-07-26 DIAGNOSIS — D45 Polycythemia vera: Secondary | ICD-10-CM | POA: Diagnosis not present

## 2016-07-26 DIAGNOSIS — Z0000 Encounter for general adult medical examination without abnormal findings: Secondary | ICD-10-CM | POA: Diagnosis not present

## 2016-07-26 DIAGNOSIS — Z23 Encounter for immunization: Secondary | ICD-10-CM | POA: Diagnosis not present

## 2016-07-26 DIAGNOSIS — Z9181 History of falling: Secondary | ICD-10-CM | POA: Diagnosis not present

## 2016-07-26 DIAGNOSIS — E7800 Pure hypercholesterolemia, unspecified: Secondary | ICD-10-CM | POA: Diagnosis not present

## 2016-07-26 DIAGNOSIS — D751 Secondary polycythemia: Secondary | ICD-10-CM | POA: Diagnosis not present

## 2016-07-26 DIAGNOSIS — E669 Obesity, unspecified: Secondary | ICD-10-CM | POA: Diagnosis not present

## 2016-07-26 DIAGNOSIS — Z1389 Encounter for screening for other disorder: Secondary | ICD-10-CM | POA: Diagnosis not present

## 2016-07-26 DIAGNOSIS — I2510 Atherosclerotic heart disease of native coronary artery without angina pectoris: Secondary | ICD-10-CM | POA: Diagnosis not present

## 2016-07-27 ENCOUNTER — Other Ambulatory Visit: Payer: Self-pay

## 2016-07-27 ENCOUNTER — Ambulatory Visit (HOSPITAL_COMMUNITY): Payer: Medicare Other | Attending: Cardiology

## 2016-07-27 DIAGNOSIS — R0600 Dyspnea, unspecified: Secondary | ICD-10-CM | POA: Insufficient documentation

## 2016-07-27 DIAGNOSIS — I10 Essential (primary) hypertension: Secondary | ICD-10-CM | POA: Insufficient documentation

## 2016-07-27 DIAGNOSIS — I2510 Atherosclerotic heart disease of native coronary artery without angina pectoris: Secondary | ICD-10-CM | POA: Diagnosis not present

## 2016-07-27 DIAGNOSIS — E785 Hyperlipidemia, unspecified: Secondary | ICD-10-CM

## 2016-07-27 MED ORDER — PERFLUTREN LIPID MICROSPHERE
1.0000 mL | INTRAVENOUS | Status: AC | PRN
  Administered 2016-07-27: 2 mL via INTRAVENOUS

## 2016-07-31 DIAGNOSIS — C672 Malignant neoplasm of lateral wall of bladder: Secondary | ICD-10-CM | POA: Diagnosis not present

## 2016-07-31 DIAGNOSIS — R972 Elevated prostate specific antigen [PSA]: Secondary | ICD-10-CM | POA: Diagnosis not present

## 2016-08-10 DIAGNOSIS — R8271 Bacteriuria: Secondary | ICD-10-CM | POA: Diagnosis not present

## 2016-08-10 DIAGNOSIS — R351 Nocturia: Secondary | ICD-10-CM | POA: Diagnosis not present

## 2016-09-17 ENCOUNTER — Other Ambulatory Visit: Payer: Self-pay | Admitting: Cardiovascular Disease

## 2017-03-20 DIAGNOSIS — Z1159 Encounter for screening for other viral diseases: Secondary | ICD-10-CM | POA: Diagnosis not present

## 2017-03-20 DIAGNOSIS — E785 Hyperlipidemia, unspecified: Secondary | ICD-10-CM | POA: Diagnosis not present

## 2017-03-20 DIAGNOSIS — Z131 Encounter for screening for diabetes mellitus: Secondary | ICD-10-CM | POA: Diagnosis not present

## 2017-03-20 DIAGNOSIS — I2510 Atherosclerotic heart disease of native coronary artery without angina pectoris: Secondary | ICD-10-CM | POA: Diagnosis not present

## 2017-03-20 DIAGNOSIS — Z125 Encounter for screening for malignant neoplasm of prostate: Secondary | ICD-10-CM | POA: Diagnosis not present

## 2017-03-20 DIAGNOSIS — I259 Chronic ischemic heart disease, unspecified: Secondary | ICD-10-CM | POA: Diagnosis not present

## 2017-03-20 DIAGNOSIS — I10 Essential (primary) hypertension: Secondary | ICD-10-CM | POA: Diagnosis not present

## 2017-04-10 DIAGNOSIS — D45 Polycythemia vera: Secondary | ICD-10-CM | POA: Diagnosis not present

## 2017-04-10 DIAGNOSIS — D72829 Elevated white blood cell count, unspecified: Secondary | ICD-10-CM | POA: Diagnosis not present

## 2017-04-10 DIAGNOSIS — D751 Secondary polycythemia: Secondary | ICD-10-CM | POA: Diagnosis not present

## 2017-04-18 DIAGNOSIS — D72829 Elevated white blood cell count, unspecified: Secondary | ICD-10-CM | POA: Diagnosis not present

## 2017-04-18 DIAGNOSIS — D45 Polycythemia vera: Secondary | ICD-10-CM | POA: Diagnosis not present

## 2017-04-18 DIAGNOSIS — D751 Secondary polycythemia: Secondary | ICD-10-CM | POA: Diagnosis not present

## 2017-04-20 DIAGNOSIS — D45 Polycythemia vera: Secondary | ICD-10-CM | POA: Diagnosis not present

## 2017-05-03 DIAGNOSIS — D751 Secondary polycythemia: Secondary | ICD-10-CM | POA: Diagnosis not present

## 2017-05-21 DIAGNOSIS — D72829 Elevated white blood cell count, unspecified: Secondary | ICD-10-CM | POA: Diagnosis not present

## 2017-05-22 DIAGNOSIS — Z7982 Long term (current) use of aspirin: Secondary | ICD-10-CM | POA: Diagnosis not present

## 2017-05-22 DIAGNOSIS — D45 Polycythemia vera: Secondary | ICD-10-CM | POA: Diagnosis not present

## 2017-06-05 DIAGNOSIS — D751 Secondary polycythemia: Secondary | ICD-10-CM | POA: Diagnosis not present

## 2017-06-19 DIAGNOSIS — D751 Secondary polycythemia: Secondary | ICD-10-CM | POA: Diagnosis not present

## 2017-06-19 DIAGNOSIS — D72829 Elevated white blood cell count, unspecified: Secondary | ICD-10-CM | POA: Diagnosis not present

## 2017-07-03 DIAGNOSIS — Z7982 Long term (current) use of aspirin: Secondary | ICD-10-CM

## 2017-07-03 DIAGNOSIS — D751 Secondary polycythemia: Secondary | ICD-10-CM | POA: Diagnosis not present

## 2017-07-03 DIAGNOSIS — D45 Polycythemia vera: Secondary | ICD-10-CM | POA: Diagnosis not present

## 2017-07-17 DIAGNOSIS — D751 Secondary polycythemia: Secondary | ICD-10-CM | POA: Diagnosis not present

## 2017-07-19 ENCOUNTER — Other Ambulatory Visit: Payer: Self-pay | Admitting: Cardiovascular Disease

## 2017-07-23 DIAGNOSIS — M722 Plantar fascial fibromatosis: Secondary | ICD-10-CM | POA: Diagnosis not present

## 2017-07-23 DIAGNOSIS — M79671 Pain in right foot: Secondary | ICD-10-CM | POA: Diagnosis not present

## 2017-07-31 DIAGNOSIS — D751 Secondary polycythemia: Secondary | ICD-10-CM | POA: Diagnosis not present

## 2017-08-14 DIAGNOSIS — D751 Secondary polycythemia: Secondary | ICD-10-CM | POA: Diagnosis not present

## 2017-08-14 DIAGNOSIS — Z7982 Long term (current) use of aspirin: Secondary | ICD-10-CM | POA: Diagnosis not present

## 2017-08-14 DIAGNOSIS — D45 Polycythemia vera: Secondary | ICD-10-CM | POA: Diagnosis not present

## 2017-08-23 ENCOUNTER — Ambulatory Visit (INDEPENDENT_AMBULATORY_CARE_PROVIDER_SITE_OTHER): Payer: Medicare Other | Admitting: Cardiovascular Disease

## 2017-08-23 ENCOUNTER — Encounter: Payer: Self-pay | Admitting: Cardiovascular Disease

## 2017-08-23 VITALS — BP 128/80 | HR 62 | Ht 71.0 in | Wt 260.6 lb

## 2017-08-23 DIAGNOSIS — I10 Essential (primary) hypertension: Secondary | ICD-10-CM

## 2017-08-23 DIAGNOSIS — I2510 Atherosclerotic heart disease of native coronary artery without angina pectoris: Secondary | ICD-10-CM | POA: Diagnosis not present

## 2017-08-23 DIAGNOSIS — E785 Hyperlipidemia, unspecified: Secondary | ICD-10-CM

## 2017-08-23 MED ORDER — ROSUVASTATIN CALCIUM 40 MG PO TABS: 40.0000 mg | ORAL_TABLET | Freq: Every day | ORAL | 3 refills | Status: DC

## 2017-08-23 MED ORDER — ATENOLOL 25 MG PO TABS: 25.0000 mg | ORAL_TABLET | Freq: Every day | ORAL | 3 refills | Status: DC

## 2017-08-23 NOTE — Patient Instructions (Signed)
Medication Instructions:  Your provider recommends that you continue on your current medications as directed. Please refer to the Current Medication list given to you today.    Labwork: None  Testing/Procedures: None  Follow-Up: Your provider wants you to follow-up in: 9 months with Dr. Burt Knack. You will receive a reminder letter in the mail two months in advance. If you don't receive a letter, please call our office to schedule the follow-up appointment.    Any Other Special Instructions Will Be Listed Below (If Applicable).     If you need a refill on your cardiac medications before your next appointment, please call your pharmacy.

## 2017-08-23 NOTE — Progress Notes (Signed)
Cardiology Office Note Date:  08/23/2017   ID:  Leonard Washington, DOB Apr 23, 1946, MRN [id number]  PCP:  Cyndy Freeze, MD  Cardiologist:  Sherren Mocha, MD    Chief Complaint  Patient presents with  . Follow-up     History of Present Illness: Leonard Washington is a 71 y.o. male who presents for follow-up evaluation.  He has coronary artery disease and underwent four-vessel CABG in 2005 with the LIMA to LAD, left radial artery to RCA, saphenous vein graft to ramus intermediate, and saphenous vein graft to left circumflex marginal. He presented at that time with symptoms of non-ST elevation infarction.   The patient is doing well.  He has not had any cardiac issues over the past year.  He specifically denies chest pain or leg swelling.  He has had no palpitations or lightheadedness.  He has mild shortness of breath with activity which is unchanged over a long time.  He occasionally wakes up at night with transient shortness of breath.  This is happened about 2-3 times over the past year.  He was evaluated last year for the same problem and had an echocardiogram done.  He has no other symptoms suggestive of sleep apnea.  He is compliant with his medications.  He has not been successful losing any weight.   Past Medical History:  Diagnosis Date  . Coronary atherosclerosis of native coronary artery    Vessel CABG 2005  . Hyperlipidemia   . Hypertension     Past Surgical History:  Procedure Laterality Date  . ARTERIAL DUPLEX  04/19/2004   Carotid-no evidence of significant plaque noted, no evidence of significant ICA stenosis, artery flow is antegrade. Upper extremities-within normal limits. Lower extremities-no evidence of lower extremity arterial occlusive disease at rest  . CARDIAC CATHETERIZATION  04/19/2004   severe four vessel coronary disease, continue vigorous medical therapy, recommendations for multivessel bypass grafting  . CARDIOVASCULAR STRESS TEST  07/01/2009   normal pattern of  perfusion in all regions, no scintigraphic evidence of inducible myocardial ischemia,EKG negative for ischemia, no ECG changes,  . CORONARY ARTERY BYPASS GRAFT  04/20/2004   x4. LIMA to LAD, left radial artery to RCA, SVG to ramus intermedius, SVG to circ.  Marland Kitchen DOPPLER ECHOCARDIOGRAPHY  11/11/2012   EF 55-60%, wall motion normal, no regional wall motion abnormalities, thickness of ventricular septum was mildly increased, mild regurg of the mitral valve    Current Outpatient Prescriptions  Medication Sig Dispense Refill  . aspirin 81 MG tablet Take 81 mg by mouth daily.    . Omega-3 Fatty Acids (FISH OIL) 1000 MG CAPS Take 1,000 mg by mouth daily.     Marland Kitchen triamcinolone cream (KENALOG) 0.1 % Apply 1 application topically daily as needed (itching).   4  . atenolol (TENORMIN) 25 MG tablet Take 1 tablet (25 mg total) by mouth daily. 90 tablet 3  . rosuvastatin (CRESTOR) 40 MG tablet TAKE 1 TABLET BY MOUTH DAILY 90 tablet 0   No current facility-administered medications for this visit.     Allergies:   Zocor [simvastatin]   Social History:  The patient  reports that he quit smoking about 38 years ago. He has quit using smokeless tobacco. He reports that he does not drink alcohol or use drugs.   Family History:  The patient's family history includes Heart attack in his maternal grandfather, maternal uncle, maternal uncle, and maternal uncle.    ROS:  Please see the history of present illness.  Otherwise, review of  systems is positive for hearing loss, easy bruising, excessive sweating, constipation.  All other systems are reviewed and negative.    PHYSICAL EXAM: VS:  BP 128/80   Pulse 62   Ht 5\' 11"  (1.803 m)   Wt 260 lb 9.6 oz (118.2 kg)   BMI 36.35 kg/m  , BMI Body mass index is 36.35 kg/m. GEN: Well nourished, well developed, in no acute distress  HEENT: normal  Neck: no JVD, no masses. No carotid bruits Cardiac: RRR without murmur or gallop                Respiratory:  clear to  auscultation bilaterally, normal work of breathing GI: soft, nontender, nondistended, + BS MS: no deformity or atrophy  Ext: no pretibial edema, pedal pulses 2+= bilaterally Skin: warm and dry, no rash Neuro:  Strength and sensation are intact Psych: euthymic mood, full affect  EKG:  EKG is ordered today. The ekg ordered today shows normal sinus rhythm 62 bpm, first-degree AV block, otherwise within normal limits.  Recent Labs: No results found for requested labs within last 8760 hours.   Lipid Panel     Component Value Date/Time   CHOL 156 07/13/2016 0832   TRIG 104 07/13/2016 0832   HDL 50 07/13/2016 0832   CHOLHDL 3.1 07/13/2016 0832   VLDL 21 07/13/2016 0832   LDLCALC 85 07/13/2016 0832      Wt Readings from Last 3 Encounters:  08/23/17 260 lb 9.6 oz (118.2 kg)  07/13/16 256 lb (116.1 kg)  04/19/15 268 lb (121.6 kg)     Cardiac Studies Reviewed: 2D Echo 07/27/2016: Study Conclusions  - Procedure narrative: Transthoracic echocardiography. Image   quality was suboptimal. The study was technically difficult.   Intravenous contrast (Definity) was administered. - Left ventricle: The cavity size was normal. Wall thickness was   normal. Systolic function was normal. The estimated ejection   fraction was in the range of 55% to 60%. Possible hypokinesis of   the basal-midinferior myocardium. Features are consistent with a   pseudonormal left ventricular filling pattern, with concomitant   abnormal relaxation and increased filling pressure (grade 2   diastolic dysfunction). - Aortic valve: Trileaflet; moderately thickened, mildly calcified   leaflets. - Left atrium: The atrium was mildly dilated. - Right ventricle: The cavity size was mildly dilated. Wall   thickness was normal. - Tricuspid valve: There was trivial regurgitation.  ASSESSMENT AND PLAN: 1.  CAD, native vessel, without angina: The patient's medicines are reviewed and will be continued without change.  He  remains on aspirin, beta-blocker, and a high intensity statin drug.  2.  Hyperlipidemia: Treated with Crestor 40 mg daily.  Labs are followed by his primary physician.  Lifestyle modification is discussed today.  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 Bobby Rumpf who is his hematologist.  He is followed closely.  4.  Hypertension: Blood pressure well controlled.  He continues on atenolol.  Weight loss is discussed.  5.  Shortness of breath: Suspect deconditioning and obesity are the primary issues.  He had an echocardiogram last year demonstrating grade 2 diastolic dysfunction.  Lifestyle modification discussed.  Current medicines are reviewed with the patient today.  The patient does not have concerns regarding medicines.  Labs/ tests ordered today include:  No orders of the defined types were placed in this encounter.   Disposition:   FU 9 months  Signed, Sherren Mocha, MD  08/23/2017 3:14 PM    [facility] [place], [place], [place]  [address] Phone: [phone]; Fax: [phone]

## 2017-08-28 DIAGNOSIS — D45 Polycythemia vera: Secondary | ICD-10-CM | POA: Diagnosis not present

## 2017-08-31 DIAGNOSIS — Z1211 Encounter for screening for malignant neoplasm of colon: Secondary | ICD-10-CM | POA: Diagnosis not present

## 2017-08-31 DIAGNOSIS — Z0000 Encounter for general adult medical examination without abnormal findings: Secondary | ICD-10-CM | POA: Diagnosis not present

## 2017-09-11 DIAGNOSIS — D45 Polycythemia vera: Secondary | ICD-10-CM | POA: Diagnosis not present

## 2017-09-12 DIAGNOSIS — Z0000 Encounter for general adult medical examination without abnormal findings: Secondary | ICD-10-CM | POA: Diagnosis not present

## 2017-09-12 DIAGNOSIS — Z23 Encounter for immunization: Secondary | ICD-10-CM | POA: Diagnosis not present

## 2017-09-25 DIAGNOSIS — D45 Polycythemia vera: Secondary | ICD-10-CM | POA: Diagnosis not present

## 2017-10-02 DIAGNOSIS — Z6835 Body mass index (BMI) 35.0-35.9, adult: Secondary | ICD-10-CM | POA: Diagnosis not present

## 2017-10-02 DIAGNOSIS — Z0000 Encounter for general adult medical examination without abnormal findings: Secondary | ICD-10-CM | POA: Diagnosis not present

## 2017-10-02 DIAGNOSIS — I10 Essential (primary) hypertension: Secondary | ICD-10-CM | POA: Diagnosis not present

## 2017-10-28 ENCOUNTER — Other Ambulatory Visit: Payer: Self-pay | Admitting: Cardiovascular Disease

## 2018-01-23 DIAGNOSIS — D45 Polycythemia vera: Secondary | ICD-10-CM | POA: Diagnosis not present

## 2018-01-23 DIAGNOSIS — D751 Secondary polycythemia: Secondary | ICD-10-CM | POA: Diagnosis not present

## 2018-03-26 DIAGNOSIS — D751 Secondary polycythemia: Secondary | ICD-10-CM | POA: Diagnosis not present

## 2018-05-06 ENCOUNTER — Encounter: Payer: Self-pay | Admitting: Cardiovascular Disease

## 2018-05-20 ENCOUNTER — Ambulatory Visit: Payer: Medicare Other | Admitting: Cardiovascular Disease

## 2018-05-29 DIAGNOSIS — D45 Polycythemia vera: Secondary | ICD-10-CM

## 2018-05-29 DIAGNOSIS — R7989 Other specified abnormal findings of blood chemistry: Secondary | ICD-10-CM

## 2018-05-29 DIAGNOSIS — D72829 Elevated white blood cell count, unspecified: Secondary | ICD-10-CM

## 2018-05-29 DIAGNOSIS — D751 Secondary polycythemia: Secondary | ICD-10-CM | POA: Diagnosis not present

## 2018-06-20 ENCOUNTER — Encounter: Payer: Self-pay | Admitting: Cardiovascular Disease

## 2018-06-20 ENCOUNTER — Ambulatory Visit: Payer: Medicare Other | Admitting: Cardiovascular Disease

## 2018-06-20 VITALS — BP 120/86 | HR 57 | Ht 71.0 in | Wt 257.0 lb

## 2018-06-20 DIAGNOSIS — I2510 Atherosclerotic heart disease of native coronary artery without angina pectoris: Secondary | ICD-10-CM | POA: Diagnosis not present

## 2018-06-20 DIAGNOSIS — E785 Hyperlipidemia, unspecified: Secondary | ICD-10-CM

## 2018-06-20 DIAGNOSIS — I10 Essential (primary) hypertension: Secondary | ICD-10-CM | POA: Diagnosis not present

## 2018-06-20 NOTE — Patient Instructions (Addendum)
Medication Instructions:  Your provider recommends that you continue on your current medications as directed. Please refer to the Current Medication list given to you today.    Labwork: Your provider recommends that you have FASTING lab work. Please proceed to your nearest [facility] to have drawn.   [place], [place] [address]  Testing/Procedures: None  Follow-Up: Your provider wants you to follow-up in: 9 months with Dr. Burt Knack. You will receive a reminder letter in the mail two months in advance. If you don't receive a letter, please call our office to schedule the follow-up appointment.    Any Other Special Instructions Will Be Listed Below (If Applicable).     If you need a refill on your cardiac medications before your next appointment, please call your pharmacy.

## 2018-06-20 NOTE — Progress Notes (Signed)
Cardiology Office Note Date:  06/20/2018   ID:  Leonard Washington, DOB [birth year], MRN [id number]  PCP:  Cyndy Freeze, MD  Cardiologist:  Sherren Mocha, MD    Chief Complaint  Patient presents with  . Coronary Artery Disease     History of Present Illness: Leonard Washington is a 72 y.o. male who presents for follow-up evaluation.  He has coronary artery disease and underwent four-vessel CABG in 2005 with the LIMA to LAD, left radial artery to RCA, saphenous vein graft to ramus intermediate, and saphenous vein graft to left circumflex marginal. He presented at that time with symptoms of non-ST elevation infarction.   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 He's currently requiring monthly phlebotomy and he's followed by hematology in [place], [place]. Most recent hematocrit is 50.0. Other cell lines on recent labs show a WBC of 14.7 and platelet count of 442,000.  He is here alone today. Doing well without any CV-related complaints. Today, he denies symptoms of palpitations, chest pain, shortness of breath, orthopnea, PND, lower extremity edema, dizziness, or syncope.   Past Medical History:  Diagnosis Date  . Coronary atherosclerosis of native coronary artery    Vessel CABG 2005  . Hyperlipidemia   . Hypertension     Past Surgical History:  Procedure Laterality Date  . ARTERIAL DUPLEX  04/19/2004   Carotid-no evidence of significant plaque noted, no evidence of significant ICA stenosis, artery flow is antegrade. Upper extremities-within normal limits. Lower extremities-no evidence of lower extremity arterial occlusive disease at rest  . CARDIAC CATHETERIZATION  04/19/2004   severe four vessel coronary disease, continue vigorous medical therapy, recommendations for multivessel bypass grafting  . CARDIOVASCULAR STRESS TEST  07/01/2009   normal pattern of perfusion in all regions, no scintigraphic evidence of inducible myocardial ischemia,EKG negative for ischemia, no ECG  changes,  . CORONARY ARTERY BYPASS GRAFT  04/20/2004   x4. LIMA to LAD, left radial artery to RCA, SVG to ramus intermedius, SVG to circ.  Marland Kitchen DOPPLER ECHOCARDIOGRAPHY  11/11/2012   EF 55-60%, wall motion normal, no regional wall motion abnormalities, thickness of ventricular septum was mildly increased, mild regurg of the mitral valve    Current Outpatient Medications  Medication Sig Dispense Refill  . aspirin 81 MG tablet Take 81 mg by mouth daily.    Marland Kitchen atenolol (TENORMIN) 25 MG tablet Take 1 tablet (25 mg total) by mouth daily. 90 tablet 3  . Omega-3 Fatty Acids (FISH OIL) 1000 MG CAPS Take 1,000 mg by mouth daily.     . rosuvastatin (CRESTOR) 40 MG tablet Take 1 tablet (40 mg total) by mouth daily. 90 tablet 3  . triamcinolone cream (KENALOG) 0.1 % Apply 1 application topically daily as needed (itching).   4   No current facility-administered medications for this visit.     Allergies:   Zocor [simvastatin]   Social History:  The patient  reports that he quit smoking about 39 years ago. He has never used smokeless tobacco. He reports that he does not drink alcohol or use drugs.   Family History:  The patient's  family history includes Heart attack in his maternal grandfather, maternal uncle, maternal uncle, and maternal uncle.    ROS:  Please see the history of present illness.  All other systems are reviewed and negative.    PHYSICAL EXAM: VS:  BP 120/86   Pulse (!) 57   Ht 5\' 11"  (1.803 m)   Wt 257 lb (116.6 kg)  SpO2 97%   BMI 35.84 kg/m  , BMI Body mass index is 35.84 kg/m. GEN: Well nourished, well developed, in no acute distress  HEENT: normal  Neck: no JVD, no masses. No carotid bruits Cardiac: RRR without murmur or gallop                Respiratory:  clear to auscultation bilaterally, normal work of breathing GI: soft, nontender, nondistended, + BS MS: no deformity or atrophy  Ext: no pretibial edema, pedal pulses 2+= bilaterally Skin: warm and dry, no  rash Neuro:  Strength and sensation are intact Psych: euthymic mood, full affect  EKG:  EKG is not ordered today.  Recent Labs: No results found for requested labs within last 8760 hours.   Lipid Panel     Component Value Date/Time   CHOL 156 07/13/2016 0832   TRIG 104 07/13/2016 0832   HDL 50 07/13/2016 0832   CHOLHDL 3.1 07/13/2016 0832   VLDL 21 07/13/2016 0832   LDLCALC 85 07/13/2016 0832      Wt Readings from Last 3 Encounters:  06/20/18 257 lb (116.6 kg)  08/23/17 260 lb 9.6 oz (118.2 kg)  07/13/16 256 lb (116.1 kg)     Cardiac Studies Reviewed: Echo 07-27-2016: Study Conclusions  - Procedure narrative: Transthoracic echocardiography. Image   quality was suboptimal. The study was technically difficult.   Intravenous contrast (Definity) was administered. - Left ventricle: The cavity size was normal. Wall thickness was   normal. Systolic function was normal. The estimated ejection   fraction was in the range of 55% to 60%. Possible hypokinesis of   the basal-midinferior myocardium. Features are consistent with a   pseudonormal left ventricular filling pattern, with concomitant   abnormal relaxation and increased filling pressure (grade 2   diastolic dysfunction). - Aortic valve: Trileaflet; moderately thickened, mildly calcified   leaflets. - Left atrium: The atrium was mildly dilated. - Right ventricle: The cavity size was mildly dilated. Wall   thickness was normal. - Tricuspid valve: There was trivial regurgitation.  Stress Test 03-15-2015: ECG Baseline ECG is normal.  There was no ST segment deviation noted during stress.  Arrhythmias during stress: none.  Arrhythmias during recovery: none.  There were no significant arrhythmias noted during the test.  ECG was interpretable and non-conclusive.  Patient had submaximal Exercise. Fair exercise capacity. No chest pain. Patient did complain of significant dyspnea in Stage 2. Normal BP response to  exercise. No ST changes to suggest ischemia at submaximal exercise. FU with Dr. Sherren Mocha as planned. Signed,  Richardson Dopp, PA-C  03/15/2015 9:50 AM  Stress Findings The patient exercised following the Bruce protocol.    The patient reported shortness of breath during the stress test. The patient experienced no angina during the stress test.   Blood pressure demonstrated a normal response to exercise. Heart rate demonstrated pharmacologically blunted response to exercise. Overall, the patient's exercise capacity was mildly impaired.   The patient's response to exercise was inadequate for diagnosis.  Stress Measurements   Baseline Vitals  Rest HR 57 bpm    Rest BP 117/78 mmHg    Exercise Time  Exercise duration (min) 6 min    Peak Stress Vitals  Peak HR 112 BPM    Peak BP 161 mmHg    Exercise Data  MPHR 151 bpm    Percent HR 74 %    RPE 15     Estimated workload 7 METS        ASSESSMENT  AND PLAN: 1.  CAD, native vessel, without angina: The patient is treated with aspirin, a beta-blocker, and a statin drug.  We discussed lifestyle modification with diet and exercise.  He seems stable and I will see him back in 9 months.  2.  Mixed hyperlipidemia: Treated with Crestor 40 mg daily.  Last labs are reviewed but are done in 2017.  At that time his LDL was 85, HDL 50, total cholesterol 156.  Will update labs.  3.  Polycythemia rubra vera: Patient is followed in [place] by Dr. Bobby Rumpf with hematology.  His notes are reviewed.  Current medicines are reviewed with the patient today.  The patient does not have concerns regarding medicines.  Labs/ tests ordered today include:   Orders Placed This Encounter  Procedures  . Comprehensive metabolic panel  . Lipid panel    Disposition:   FU 9 months  Signed, Sherren Mocha, MD  06/20/2018 5:24 PM    [address], [place], [place]  [address] Phone: [phone]; Fax: [phone]

## 2018-06-24 ENCOUNTER — Telehealth: Payer: Self-pay | Admitting: Cardiovascular Disease

## 2018-06-24 DIAGNOSIS — I10 Essential (primary) hypertension: Secondary | ICD-10-CM | POA: Diagnosis not present

## 2018-06-24 DIAGNOSIS — E875 Hyperkalemia: Secondary | ICD-10-CM

## 2018-06-24 DIAGNOSIS — E785 Hyperlipidemia, unspecified: Secondary | ICD-10-CM | POA: Diagnosis not present

## 2018-06-24 LAB — COMPREHENSIVE METABOLIC PANEL
A/G RATIO: 2 (ref 1.2–2.2)
ALBUMIN: 4.3 g/dL (ref 3.5–4.8)
ALT: 12 IU/L (ref 0–44)
AST: 19 IU/L (ref 0–40)
Alkaline Phosphatase: 90 IU/L (ref 39–117)
BUN / CREAT RATIO: 19 (ref 10–24)
BUN: 19 mg/dL (ref 8–27)
Bilirubin Total: 0.7 mg/dL (ref 0.0–1.2)
CHLORIDE: 104 mmol/L (ref 96–106)
CO2: 24 mmol/L (ref 20–29)
Calcium: 9.8 mg/dL (ref 8.6–10.2)
Creatinine, Ser: 1.02 mg/dL (ref 0.76–1.27)
GFR calc non Af Amer: 73 mL/min/{1.73_m2} (ref >59)
GFR, EST AFRICAN AMERICAN: 84 mL/min/{1.73_m2} (ref >59)
GLUCOSE: 100 mg/dL — AB (ref 65–99)
Globulin, Total: 2.1 g/dL (ref 1.5–4.5)
POTASSIUM: 5.8 mmol/L — AB (ref 3.5–5.2)
Sodium: 140 mmol/L (ref 134–144)
Total Protein: 6.4 g/dL (ref 6.0–8.5)

## 2018-06-24 NOTE — Telephone Encounter (Signed)
Call received from [facility] with critical lab value. Potassium of 5.8. Attemtped to contact patient, but there was no answer. Left message for patient to call back. Will discuss with Dr. Burt Knack.

## 2018-06-24 NOTE — Telephone Encounter (Signed)
New Message    Gwenette Greet with [facility] is calling to advise of a critical lab for potassium.

## 2018-06-24 NOTE — Telephone Encounter (Signed)
Follow UP:  Returning call

## 2018-06-25 ENCOUNTER — Other Ambulatory Visit: Payer: Self-pay

## 2018-06-25 ENCOUNTER — Other Ambulatory Visit: Payer: Medicare Other

## 2018-06-25 ENCOUNTER — Ambulatory Visit (INDEPENDENT_AMBULATORY_CARE_PROVIDER_SITE_OTHER): Payer: Medicare Other

## 2018-06-25 ENCOUNTER — Telehealth: Payer: Self-pay

## 2018-06-25 VITALS — HR 56 | Ht 71.0 in | Wt 257.8 lb

## 2018-06-25 DIAGNOSIS — E875 Hyperkalemia: Secondary | ICD-10-CM | POA: Diagnosis not present

## 2018-06-25 LAB — BASIC METABOLIC PANEL
BUN/Creatinine Ratio: 20 (ref 10–24)
BUN: 19 mg/dL (ref 8–27)
CO2: 27 mmol/L (ref 20–29)
Calcium: 9.1 mg/dL (ref 8.6–10.2)
Chloride: 105 mmol/L (ref 96–106)
Creatinine, Ser: 0.95 mg/dL (ref 0.76–1.27)
GFR, EST AFRICAN AMERICAN: 92 mL/min/{1.73_m2} (ref >59)
GFR, EST NON AFRICAN AMERICAN: 80 mL/min/{1.73_m2} (ref >59)
Glucose: 94 mg/dL (ref 65–99)
Potassium: 5.4 mmol/L — ABNORMAL HIGH (ref 3.5–5.2)
SODIUM: 139 mmol/L (ref 134–144)

## 2018-06-25 LAB — LIPID PANEL
CHOLESTEROL TOTAL: 132 mg/dL (ref 100–199)
Chol/HDL Ratio: 2.8 ratio (ref 0.0–5.0)
HDL: 48 mg/dL (ref >39)
LDL Calculated: 70 mg/dL (ref 0–99)
Triglycerides: 69 mg/dL (ref 0–149)
VLDL CHOLESTEROL CAL: 14 mg/dL (ref 5–40)

## 2018-06-25 NOTE — Patient Instructions (Addendum)
Medication Instructions:  Your provider recommends that you continue on your current medications as directed. Please refer to the Current Medication list given to you today.    Labwork: BMET drawn today to repeat K  Testing/Procedures: None  Follow-Up: As scheduled in 9 months (unless instructed otherwise from results)  Any Other Special Instructions Will Be Listed Below (If Applicable).     If you need a refill on your cardiac medications before your next appointment, please call your pharmacy.

## 2018-06-25 NOTE — Progress Notes (Signed)
thx agree with plan 

## 2018-06-25 NOTE — Telephone Encounter (Signed)
Spoke with patient. Reviewed high potassium foods to avoid.  He will come in today at 1030 for BMET and EKG. He was grateful for call and agrees with treatment plan.

## 2018-06-25 NOTE — Telephone Encounter (Signed)
-----   Message from Sherren Mocha, MD sent at 06/24/2018  5:25 PM EDT ----- Labs reviewed.  Patient is on no medications that would raise potassium.  Will advise on low potassium diet, repeat a metabolic panel tomorrow to make sure the specimen is not hemolyzed, and check an EKG to evaluate for any changes of hyperkalemia.

## 2018-06-25 NOTE — Progress Notes (Signed)
Leonard Washington in today for EKG and BMET redraw after results yesterday showed K=5.8. The patient is asymptomatic and has no complaints. EKG obtained and reviewed with Dr. Irish Lack (DOD). EKG shows SB with first degree block (unchanged from previous).   The patient wishes to leave but lives an hour away. Instructed him to stay around town until labs are resulted in case the repeat specimen is still elevated.   Called patient with lab results (K=5.4). Per Dr. Irish Lack, patient is to stay hydrated and have plasma potassium drawn this week. Spoke with the patient, who will go home. Discussed in great detail foods that could be causing elevated K. He has been eating a lot of pistachios and almonds lately. Instructed him to stop eating those and stay hydrated. He will go to the [facility] near his home on Friday for K draw. He was grateful for assistance today.

## 2018-06-25 NOTE — Telephone Encounter (Signed)
Attempted several times to contact [facility] in  to confirm plasma potassium order was received and was never able to reach any representatives at the office.  Released order via Epic. Faxed order to Fax: [phone] (number available via [facility] website).

## 2018-06-27 DIAGNOSIS — D751 Secondary polycythemia: Secondary | ICD-10-CM | POA: Diagnosis not present

## 2018-06-28 ENCOUNTER — Telehealth: Payer: Self-pay | Admitting: Cardiovascular Disease

## 2018-06-28 NOTE — Telephone Encounter (Signed)
Spoke with pt and he states that he gave blood on Thursday at a blood drive and they "messed up" both of his arms because he has deep veins.  Pt does not want to go today to have labs drawn so he can "give them a break".  Pt denies palps or feeling bad.  States he has cut back on K+ rich foods as directed by Dr. Antionette Char nurse.  Will route to Dr. Antionette Char nurse to make her aware of the delay in labs.

## 2018-06-28 NOTE — Telephone Encounter (Signed)
New Message:   Patient calling because he was suppose to do blood work today, but his arms are busied he will do his lab  on Tuesday.

## 2018-07-02 DIAGNOSIS — E875 Hyperkalemia: Secondary | ICD-10-CM | POA: Diagnosis not present

## 2018-07-03 LAB — POTASSIUM, HEPARIN PLASMA: POTASSIUM, HEPARIN PLASMA: 4.5 mmol/L (ref 3.5–5.2)

## 2018-07-05 NOTE — Telephone Encounter (Signed)
Patient had K drawn 9/3.  Potassium, Heparin Plasma 3.5 - 5.2 mmol/L 4.5    Informed patient of normal results and verbal understanding expressed. He was grateful for call.

## 2018-07-23 DIAGNOSIS — E785 Hyperlipidemia, unspecified: Secondary | ICD-10-CM | POA: Diagnosis not present

## 2018-07-23 DIAGNOSIS — Z23 Encounter for immunization: Secondary | ICD-10-CM | POA: Diagnosis not present

## 2018-07-23 DIAGNOSIS — I10 Essential (primary) hypertension: Secondary | ICD-10-CM | POA: Diagnosis not present

## 2018-07-23 DIAGNOSIS — I2510 Atherosclerotic heart disease of native coronary artery without angina pectoris: Secondary | ICD-10-CM | POA: Diagnosis not present

## 2018-07-30 DIAGNOSIS — D751 Secondary polycythemia: Secondary | ICD-10-CM | POA: Diagnosis not present

## 2018-08-27 DIAGNOSIS — D45 Polycythemia vera: Secondary | ICD-10-CM | POA: Diagnosis not present

## 2018-10-28 DIAGNOSIS — Z0000 Encounter for general adult medical examination without abnormal findings: Secondary | ICD-10-CM | POA: Diagnosis not present

## 2018-11-14 ENCOUNTER — Telehealth: Payer: Self-pay | Admitting: Cardiovascular Disease

## 2018-11-14 DIAGNOSIS — E785 Hyperlipidemia, unspecified: Secondary | ICD-10-CM

## 2018-11-14 DIAGNOSIS — I10 Essential (primary) hypertension: Secondary | ICD-10-CM

## 2018-11-14 NOTE — Telephone Encounter (Signed)
New Message:      Pt would like for you to mail him his lab order. He wants to have his lab work at [facility] in [place] please.

## 2018-11-14 NOTE — Telephone Encounter (Signed)
Leonard Washington scheduled an appointment with a doctor in [place] because he was told Dr. Burt Knack had no availability when he wants to be seen in May. He also wants fasting labs drawn at that time. He refuses to see an APP and does not wish to switch doctors. Cancelled appointment with Dr. Geraldo Pitter.  Informed patient he will be called when there is a cancellation to schedule appointment/blood work.

## 2018-11-20 DIAGNOSIS — I10 Essential (primary) hypertension: Secondary | ICD-10-CM | POA: Diagnosis not present

## 2018-11-20 DIAGNOSIS — D45 Polycythemia vera: Secondary | ICD-10-CM | POA: Diagnosis not present

## 2018-11-20 DIAGNOSIS — E785 Hyperlipidemia, unspecified: Secondary | ICD-10-CM | POA: Diagnosis not present

## 2018-11-20 DIAGNOSIS — Z125 Encounter for screening for malignant neoplasm of prostate: Secondary | ICD-10-CM | POA: Diagnosis not present

## 2018-11-27 DIAGNOSIS — J069 Acute upper respiratory infection, unspecified: Secondary | ICD-10-CM | POA: Diagnosis not present

## 2018-11-29 ENCOUNTER — Encounter: Payer: Self-pay | Admitting: Gastroenterology

## 2018-11-29 ENCOUNTER — Ambulatory Visit (AMBULATORY_SURGERY_CENTER): Payer: Self-pay

## 2018-11-29 VITALS — Ht 71.0 in | Wt 259.0 lb

## 2018-11-29 DIAGNOSIS — R0989 Other specified symptoms and signs involving the circulatory and respiratory systems: Secondary | ICD-10-CM

## 2018-11-29 DIAGNOSIS — Z1211 Encounter for screening for malignant neoplasm of colon: Secondary | ICD-10-CM

## 2018-11-29 HISTORY — DX: Other specified symptoms and signs involving the circulatory and respiratory systems: R09.89

## 2018-11-29 NOTE — Progress Notes (Signed)
Per pt, no allergies to soy or egg products.Pt not taking any weight loss meds or using  O2 at home.  Pt refused emmi video. 

## 2018-12-09 NOTE — Telephone Encounter (Signed)
Scheduled patient for 9 month visit 5/29. He will have labs drawn at [facility] a few days prior. He was grateful for assistance.   Labs ordered and released.

## 2018-12-11 ENCOUNTER — Ambulatory Visit: Payer: Medicare Other | Admitting: Cardiology

## 2018-12-13 ENCOUNTER — Encounter: Payer: Self-pay | Admitting: Gastroenterology

## 2018-12-13 ENCOUNTER — Ambulatory Visit (AMBULATORY_SURGERY_CENTER): Payer: Medicare Other | Admitting: Gastroenterology

## 2018-12-13 VITALS — BP 104/64 | HR 63 | Temp 97.5°F | Resp 11 | Ht 71.0 in | Wt 259.0 lb

## 2018-12-13 DIAGNOSIS — D123 Benign neoplasm of transverse colon: Secondary | ICD-10-CM | POA: Diagnosis not present

## 2018-12-13 DIAGNOSIS — D122 Benign neoplasm of ascending colon: Secondary | ICD-10-CM | POA: Diagnosis not present

## 2018-12-13 DIAGNOSIS — D120 Benign neoplasm of cecum: Secondary | ICD-10-CM | POA: Diagnosis not present

## 2018-12-13 DIAGNOSIS — Z1211 Encounter for screening for malignant neoplasm of colon: Secondary | ICD-10-CM

## 2018-12-13 DIAGNOSIS — D124 Benign neoplasm of descending colon: Secondary | ICD-10-CM

## 2018-12-13 MED ORDER — SODIUM CHLORIDE 0.9 % IV SOLN: 500.0000 mL | Freq: Once | INTRAVENOUS | Status: DC

## 2018-12-13 NOTE — Progress Notes (Signed)
Called to room to assist during endoscopic procedure.  Patient ID and intended procedure confirmed with present staff. Received instructions for my participation in the procedure from the performing physician.  

## 2018-12-13 NOTE — Op Note (Signed)
[facility] Patient Name: Leonard Washington Procedure Date: 12/13/2018 10:41 AM MRN: [id number] Endoscopist: Thornton Park MD, MD Age: 73 Referring MD:  Date of Birth: 1946-09-20 Gender: Male Account #: [id number] Procedure:                Colonoscopy Indications:              Surveillance: Personal history of adenomatous                            polyps on last colonoscopy > 5 years ago. Cecal                            tubular adenoma and hyperplastic polyp removed                            during colonoscopy 2009. No known family history of                            colon cancer or polyps. No baseline GI symptoms. Medicines:                See the Anesthesia note for documentation of the                            administered medications Procedure:                Pre-Anesthesia Assessment:                           - Prior to the procedure, a History and Physical                            was performed, and patient medications and                            allergies were reviewed. The patient's tolerance of                            previous anesthesia was also reviewed. The risks                            and benefits of the procedure and the sedation                            options and risks were discussed with the patient.                            All questions were answered, and informed consent                            was obtained. Prior Anticoagulants: The patient has                            taken no previous anticoagulant or antiplatelet  agents. ASA Grade Assessment: III - A patient with                            severe systemic disease. After reviewing the risks                            and benefits, the patient was deemed in                            satisfactory condition to undergo the procedure.                           After obtaining informed consent, the colonoscope                            was passed under  direct vision. Throughout the                            procedure, the patient's blood pressure, pulse, and                            oxygen saturations were monitored continuously. The                            Colonoscope was introduced through the anus and                            advanced to the the terminal ileum, with                            identification of the appendiceal orifice and IC                            valve. The colonoscopy was performed without                            difficulty. The patient tolerated the procedure                            well. The quality of the bowel preparation was                            good. The terminal ileum, ileocecal valve,                            appendiceal orifice, and rectum were photographed. Scope In: 10:47:57 AM Scope Out: 11:07:17 AM Scope Withdrawal Time: 0 hours 13 minutes 17 seconds  Total Procedure Duration: 0 hours 19 minutes 20 seconds  Findings:                 The perianal and digital rectal examinations were                            normal.  Six sessile polyps were found in the transverse                            colon, hepatic flexure, ascending colon and cecum.                            The polyps were 5 to 8 mm in size. These polyps                            were removed with a cold snare. Resection and                            retrieval were complete. Estimated blood loss was                            minimal.                           A few small and large-mouthed diverticula were                            found in the sigmoid colon and descending colon. Complications:            No immediate complications. Estimated blood loss:                            Minimal. Estimated Blood Loss:     Estimated blood loss was minimal. Impression:               - Six 5 to 8 mm polyps in the transverse colon, at                            the hepatic flexure, in the ascending  colon and in                            the cecum, removed with a cold snare. Resected and                            retrieved.                           - The examination was otherwise normal on direct                            and retroflexion views. Recommendation:           - Patient has a contact number available for                            emergencies. The signs and symptoms of potential                            delayed complications were discussed with the  patient. Return to normal activities tomorrow.                            Written discharge instructions were provided to the                            patient.                           - Resume regular diet today. High fiber diet                            recommended.                           - Continue present medications.                           - Await pathology results.                           - Repeat colonoscopy in 3 years for surveillance. Thornton Park MD, MD 12/13/2018 11:14:43 AM This report has been signed electronically.

## 2018-12-13 NOTE — Progress Notes (Signed)
To PACU, VSS. Report to [email or web address] 

## 2018-12-13 NOTE — Patient Instructions (Signed)
   Information on polyps and diverticulosis given to you today  Await pathology results on polyps removed today   YOU HAD AN ENDOSCOPIC PROCEDURE TODAY AT [facility]:   Refer to the procedure report that was given to you for any specific questions about what was found during the examination.  If the procedure report does not answer your questions, please call your gastroenterologist to clarify.  If you requested that your care partner not be given the details of your procedure findings, then the procedure report has been included in a sealed envelope for you to review at your convenience later.  YOU SHOULD EXPECT: Some feelings of bloating in the abdomen. Passage of more gas than usual.  Walking can help get rid of the air that was put into your GI tract during the procedure and reduce the bloating. If you had a lower endoscopy (such as a colonoscopy or flexible sigmoidoscopy) you may notice spotting of blood in your stool or on the toilet paper. If you underwent a bowel prep for your procedure, you may not have a normal bowel movement for a few days.  Please Note:  You might notice some irritation and congestion in your nose or some drainage.  This is from the oxygen used during your procedure.  There is no need for concern and it should clear up in a day or so.  SYMPTOMS TO REPORT IMMEDIATELY:   Following lower endoscopy (colonoscopy or flexible sigmoidoscopy):  Excessive amounts of blood in the stool  Significant tenderness or worsening of abdominal pains  Swelling of the abdomen that is new, acute  Fever of 100F or higher    For urgent or emergent issues, a gastroenterologist can be reached at any hour by calling [phone].   DIET:  We do recommend a small meal at first, but then you may proceed to your regular diet.  Drink plenty of fluids but you should avoid alcoholic beverages for 24 hours.  ACTIVITY:  You should plan to take it easy for the rest of today  and you should NOT DRIVE or use heavy machinery until tomorrow (because of the sedation medicines used during the test).    FOLLOW UP: Our staff will call the number listed on your records the next business day following your procedure to check on you and address any questions or concerns that you may have regarding the information given to you following your procedure. If we do not reach you, we will leave a message.  However, if you are feeling well and you are not experiencing any problems, there is no need to return our call.  We will assume that you have returned to your regular daily activities without incident.  If any biopsies were taken you will be contacted by phone or by letter within the next 1-3 weeks.  Please call us at [phone] if you have not heard about the biopsies in 3 weeks.    SIGNATURES/CONFIDENTIALITY: You and/or your care partner have signed paperwork which will be entered into your electronic medical record.  These signatures attest to the fact that that the information above on your After Visit Summary has been reviewed and is understood.  Full responsibility of the confidentiality of this discharge information lies with you and/or your care-partner.

## 2018-12-16 ENCOUNTER — Telehealth: Payer: Self-pay

## 2018-12-16 ENCOUNTER — Telehealth: Payer: Self-pay | Admitting: *Deleted

## 2018-12-16 NOTE — Telephone Encounter (Signed)
  Follow up Call-  Call back number 12/13/2018  Post procedure Call Back phone  # [phone]  Permission to leave phone message Yes  Some recent data might be hidden      No ID on voicemail' no message left

## 2018-12-16 NOTE — Telephone Encounter (Signed)
Left message on f/u call 

## 2018-12-18 ENCOUNTER — Encounter: Payer: Self-pay | Admitting: Gastroenterology

## 2018-12-24 DIAGNOSIS — R351 Nocturia: Secondary | ICD-10-CM | POA: Diagnosis not present

## 2018-12-24 DIAGNOSIS — Z8551 Personal history of malignant neoplasm of bladder: Secondary | ICD-10-CM | POA: Diagnosis not present

## 2018-12-24 DIAGNOSIS — R972 Elevated prostate specific antigen [PSA]: Secondary | ICD-10-CM | POA: Diagnosis not present

## 2018-12-25 ENCOUNTER — Ambulatory Visit: Payer: Medicare Other | Admitting: Cardiology

## 2019-01-02 ENCOUNTER — Other Ambulatory Visit: Payer: Self-pay | Admitting: Cardiovascular Disease

## 2019-01-02 DIAGNOSIS — M25511 Pain in right shoulder: Secondary | ICD-10-CM | POA: Diagnosis not present

## 2019-01-03 DIAGNOSIS — D45 Polycythemia vera: Secondary | ICD-10-CM | POA: Diagnosis not present

## 2019-03-05 DIAGNOSIS — D45 Polycythemia vera: Secondary | ICD-10-CM

## 2019-03-07 ENCOUNTER — Telehealth: Payer: Self-pay | Admitting: Cardiovascular Disease

## 2019-03-07 NOTE — Telephone Encounter (Signed)
Patient set up for MyChart? Yes sending consent   Is patient using Smartphone/computer/tablet smartphone   Did audio/video work?no  Does patient need telephone visit?no  Best phone number to use? [phone]  Special Instructions?  Patient will have vitals day of appt , going to [facility] to have lab work done      [employer] Call  "(Name), I am calling you today to discuss your upcoming appointment. We are currently trying to limit exposure to the virus that causes COVID-19 by seeing patients at home rather than in the office."  1. "What is the BEST phone number to call the day of the visit?" - include this in appointment notes  2. Do you have or have access to (through a family member/friend) a smartphone with video capability that we can use for your visit?" a. If yes - list this number in appt notes as cell (if different from BEST phone #) and list the appointment type as a VIDEO visit in appointment notes b. If no - list the appointment type as a PHONE visit in appointment notes  3. Confirm consent - "In the setting of the current Covid19 crisis, you are scheduled for a (phone or video) visit with your provider on (date) at (time).  Just as we do with many in-office visits, in order for you to participate in this visit, we must obtain consent.  If you'd like, I can send this to your mychart (if signed up) or email for you to review.  Otherwise, I can obtain your verbal consent now.  All virtual visits are billed to your insurance company just like a normal visit would be.  By agreeing to a virtual visit, we'd like you to understand that the technology does not allow for your provider to perform an examination, and thus may limit your provider's ability to fully assess your condition. If your provider identifies any concerns that need to be evaluated in person, we will make arrangements to do so.  Finally, though the technology is pretty good, we cannot assure  that it will always work on either your or our end, and in the setting of a video visit, we may have to convert it to a phone-only visit.  In either situation, we cannot ensure that we have a secure connection.  Are you willing to proceed?" STAFF: Did the patient verbally acknowledge consent to telehealth visit? Document YES/NO here: yes  4. Advise patient to be prepared - "Two hours prior to your appointment, go ahead and check your blood pressure, pulse, oxygen saturation, and your weight (if you have the equipment to check those) and write them all down. When your visit starts, your provider will ask you for this information. If you have an Apple Watch or Kardia device, please plan to have heart rate information ready on the day of your appointment. Please have a pen and paper handy nearby the day of the visit as well."  5. Give patient instructions for MyChart download to smartphone OR Doximity/Doxy.me as below if video visit (depending on what platform provider is using)  6. Inform patient they will receive a phone call 15 minutes prior to their appointment time (may be from unknown caller ID) so they should be prepared to answer    TELEPHONE CALL NOTE  Leonard Washington has been deemed a candidate for a follow-up tele-health visit to limit community exposure during the Covid-19 pandemic. I spoke with the patient via phone to ensure availability of  phone/video source, confirm preferred email & phone number, and discuss instructions and expectations.  I reminded Leonard Washington to be prepared with any vital sign and/or heart rhythm information that could potentially be obtained via home monitoring, at the time of his visit. I reminded Leonard Washington to expect a phone call prior to his visit.  Leonard Washington 03/07/2019 2:14 PM   INSTRUCTIONS FOR DOWNLOADING THE MYCHART APP TO SMARTPHONE  - The patient must first make sure to have activated MyChart and know their login information - If Apple, go to [employer] and type in MyChart in the search bar and download the app. If Android, ask patient to go to [employer] and type in [place] in the search bar and download the app. The app is free but as with any other app downloads, their phone may require them to verify saved payment information or Apple/Android password.  - The patient will need to then log into the app with their MyChart username and password, and select [facility] as their healthcare provider to link the account. When it is time for your visit, go to the MyChart app, find appointments, and click Begin Video Visit. Be sure to Select Allow for your device to access the Microphone and Camera for your visit. You will then be connected, and your provider will be with you shortly.  **If they have any issues connecting, or need assistance please contact MyChart service desk [phone])**  **If using a computer, in order to ensure the best quality for their visit they will need to use either of the following Internet Browsers: [employer], or Google Chrome**  IF USING DOXIMITY or DOXY.ME - The patient will receive a link just prior to their visit by text.     FULL LENGTH CONSENT FOR TELE-HEALTH VISIT   I hereby voluntarily request, consent and authorize [facility] and its employed or contracted physicians, physician assistants, nurse practitioners or other licensed health care professionals (the Practitioner), to provide me with telemedicine health care services (the Services") as deemed necessary by the treating Practitioner. I acknowledge and consent to receive the Services by the Practitioner via telemedicine. I understand that the telemedicine visit will involve communicating with the Practitioner through live audiovisual communication technology and the disclosure of certain medical information by electronic transmission. I acknowledge that I have been given the opportunity to request an in-person assessment or  other available alternative prior to the telemedicine visit and am voluntarily participating in the telemedicine visit.  I understand that I have the right to withhold or withdraw my consent to the use of telemedicine in the course of my care at any time, without affecting my right to future care or treatment, and that the Practitioner or I may terminate the telemedicine visit at any time. I understand that I have the right to inspect all information obtained and/or recorded in the course of the telemedicine visit and may receive copies of available information for a reasonable fee.  I understand that some of the potential risks of receiving the Services via telemedicine include:   Delay or interruption in medical evaluation due to technological equipment failure or disruption;  Information transmitted may not be sufficient (e.g. poor resolution of images) to allow for appropriate medical decision making by the Practitioner; and/or   In rare instances, security protocols could fail, causing a breach of personal health information.  Furthermore, I acknowledge that it is my responsibility to provide information about my medical history,  conditions and care that is complete and accurate to the best of my ability. I acknowledge that Practitioner's advice, recommendations, and/or decision may be based on factors not within their control, such as incomplete or inaccurate data provided by me or distortions of diagnostic images or specimens that may result from electronic transmissions. I understand that the practice of medicine is not an exact science and that Practitioner makes no warranties or guarantees regarding treatment outcomes. I acknowledge that I will receive a copy of this consent concurrently upon execution via email to the email address I last provided but may also request a printed copy by calling the office of [facility].    I understand that my insurance will be billed for this visit.   I  have read or had this consent read to me.  I understand the contents of this consent, which adequately explains the benefits and risks of the Services being provided via telemedicine.   I have been provided ample opportunity to ask questions regarding this consent and the Services and have had my questions answered to my satisfaction.  I give my informed consent for the services to be provided through the use of telemedicine in my medical care  By participating in this telemedicine visit I agree to the above.

## 2019-03-10 DIAGNOSIS — E785 Hyperlipidemia, unspecified: Secondary | ICD-10-CM | POA: Diagnosis not present

## 2019-03-10 DIAGNOSIS — I10 Essential (primary) hypertension: Secondary | ICD-10-CM | POA: Diagnosis not present

## 2019-03-10 LAB — COMPREHENSIVE METABOLIC PANEL
ALT: 15 IU/L (ref 0–44)
AST: 19 IU/L (ref 0–40)
Albumin/Globulin Ratio: 2.3 — ABNORMAL HIGH (ref 1.2–2.2)
Albumin: 4.2 g/dL (ref 3.7–4.7)
Alkaline Phosphatase: 75 IU/L (ref 39–117)
BUN/Creatinine Ratio: 16 (ref 10–24)
BUN: 16 mg/dL (ref 8–27)
Bilirubin Total: 0.7 mg/dL (ref 0.0–1.2)
CALCIUM: 9.3 mg/dL (ref 8.6–10.2)
CO2: 33 mmol/L — AB (ref 20–29)
Chloride: 102 mmol/L (ref 96–106)
Creatinine, Ser: 1.03 mg/dL (ref 0.76–1.27)
GFR, EST AFRICAN AMERICAN: 83 mL/min/{1.73_m2} (ref >59)
GFR, EST NON AFRICAN AMERICAN: 72 mL/min/{1.73_m2} (ref >59)
Globulin, Total: 1.8 g/dL (ref 1.5–4.5)
Glucose: 95 mg/dL (ref 65–99)
Potassium: 4.5 mmol/L (ref 3.5–5.2)
Sodium: 139 mmol/L (ref 134–144)
TOTAL PROTEIN: 6 g/dL (ref 6.0–8.5)

## 2019-03-11 ENCOUNTER — Other Ambulatory Visit: Payer: Self-pay

## 2019-03-11 ENCOUNTER — Encounter: Payer: Self-pay | Admitting: Cardiovascular Disease

## 2019-03-11 ENCOUNTER — Telehealth (INDEPENDENT_AMBULATORY_CARE_PROVIDER_SITE_OTHER): Payer: Medicare Other | Admitting: Cardiovascular Disease

## 2019-03-11 VITALS — BP 131/85 | HR 58 | Ht 71.0 in | Wt 246.2 lb

## 2019-03-11 DIAGNOSIS — E782 Mixed hyperlipidemia: Secondary | ICD-10-CM

## 2019-03-11 DIAGNOSIS — I2510 Atherosclerotic heart disease of native coronary artery without angina pectoris: Secondary | ICD-10-CM

## 2019-03-11 NOTE — Progress Notes (Signed)
Virtual Visit via Video Note   This visit type was conducted due to national recommendations for restrictions regarding the COVID-19 Pandemic (e.g. social distancing) in an effort to limit this patient's exposure and mitigate transmission in our community.  Due to his co-morbid illnesses, this patient is at least at moderate risk for complications without adequate follow up.  This format is felt to be most appropriate for this patient at this time.  All issues noted in this document were discussed and addressed.  A limited physical exam was performed with this format.  Please refer to the patient's chart for his consent to telehealth for [facility].   Date:  03/11/2019   ID:  Leonard Washington, DOB 02/27/1946, MRN [id number]  Patient Location: Home Provider Location: Home  PCP:  [name], MD  Cardiologist:  Sherren Mocha, MD  Electrophysiologist:  None   Evaluation Performed:  Follow-Up Visit  Chief Complaint: Coronary artery disease  History of Present Illness:    Leonard Washington is a 73 y.o. male with History of coronary artery disease status post four-vessel CABG in 2005.  At that time the patient was treated with a LIMA to LAD, left radial artery to RCA, saphenous vein graft to ramus intermedius, and saphenous vein graft to left circumflex marginal.  He has done well with no recurrent ischemic events since his bypass surgery.  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  From a cardiac perspective he is doing fine.  Today, he denies symptoms of palpitations, chest pain, shortness of breath, orthopnea, PND, lower extremity edema, dizziness, or syncope.He is on his back deck at his house on [address] for this morning's video visit in the context of the COVID-19 pandemic.  The patient does not have symptoms concerning for COVID-19 infection (fever, chills, cough, or new shortness of breath).    Past Medical History:  Diagnosis Date   . BPH (benign prostatic hyperplasia)   . Cataract 2016   Had Bil surgery  . Chest congestion 11/29/2018  . Coronary atherosclerosis of native coronary artery    Vessel CABG 2005  . Hyperlipidemia   . Hypertension   . Myocardial infarction [facility]) 1997   Had stent put in 197/CABG in2005  . Shoulder pain    right  side  . SOB (shortness of breath) on exertion    Past Surgical History:  Procedure Laterality Date  . ARTERIAL DUPLEX  04/19/2004   Carotid-no evidence of significant plaque noted, no evidence of significant ICA stenosis, artery flow is antegrade. Upper extremities-within normal limits. Lower extremities-no evidence of lower extremity arterial occlusive disease at rest  . BLADDER SURGERY     had a growth in bladder/benign  . CARDIAC CATHETERIZATION  04/19/2004   severe four vessel coronary disease, continue vigorous medical therapy, recommendations for multivessel bypass grafting  . CARDIOVASCULAR STRESS TEST  07/01/2009   normal pattern of perfusion in all regions, no scintigraphic evidence of inducible myocardial ischemia,EKG negative for ischemia, no ECG changes,  . CATARACT EXTRACTION, BILATERAL    . CORONARY ARTERY BYPASS GRAFT  04/20/2004   x4. LIMA to LAD, left radial artery to RCA, SVG to ramus intermedius, SVG to circ.  Marland Kitchen DOPPLER ECHOCARDIOGRAPHY  11/11/2012   EF 55-60%, wall motion normal, no regional wall motion abnormalities, thickness of ventricular septum was mildly increased, mild regurg of the mitral valve     Current Meds  Medication Sig  . aspirin 81 MG tablet  Take 81 mg by mouth daily.  Marland Kitchen atenolol (TENORMIN) 25 MG tablet TAKE 1 TABLET(25 MG) BY MOUTH DAILY  . hydroxyurea (HYDREA) 500 MG capsule TK ONE C PO  QD  . Omega-3 Fatty Acids (FISH OIL) 1000 MG CAPS Take 1,200 mg by mouth daily.   Marland Kitchen triamcinolone cream (KENALOG) 0.5 % Apply 1 application topically as needed.      Allergies:   Zocor [simvastatin]   Social History   Tobacco Use  . Smoking status:  Former Smoker    Last attempt to quit: 04/17/1979    Years since quitting: 39.9  . Smokeless tobacco: Never Used  Substance Use Topics  . Alcohol use: Yes    Alcohol/week: 12.0 standard drinks    Types: 12 Cans of beer per week  . Drug use: No     Family Hx: The patient's family history includes Colon cancer in his maternal uncle; Heart attack in his maternal grandfather, maternal uncle, maternal uncle, and maternal uncle; Heart disease in his sister.  ROS:   Please see the history of present illness.    All other systems reviewed and are negative.    Labs/Other Tests and Data Reviewed:    EKG:  An ECG dated June 25, 2018 was personally reviewed today and demonstrated:  Sinus bradycardia 57 bpm, otherwise within normal limits  Recent Labs: 03/10/2019: ALT 15; BUN 16; Creatinine, Ser 1.03; Potassium 4.5; Sodium 139   Recent Lipid Panel Lab Results  Component Value Date/Time   CHOL 132 06/24/2018 09:47 AM   TRIG 69 06/24/2018 09:47 AM   HDL 48 06/24/2018 09:47 AM   CHOLHDL 2.8 06/24/2018 09:47 AM   CHOLHDL 3.1 07/13/2016 08:32 AM   LDLCALC 70 06/24/2018 09:47 AM    Wt Readings from Last 3 Encounters:  03/11/19 246 lb 3.2 oz (111.7 kg)  12/13/18 259 lb (117.5 kg)  11/29/18 259 lb (117.5 kg)     Objective:    Vital Signs:  BP 131/85   Pulse (!) 58   Ht 5\' 11"  (1.803 m)   Wt 246 lb 3.2 oz (111.7 kg)   BMI 34.34 kg/m    VITAL SIGNS:  reviewed The patient is alert, oriented, in no distress.  He is breathing comfortably in normal conversation.  HEENT is normal.  Remaining exam not performed as this is a virtual video conference visit  ASSESSMENT & PLAN:    1. Coronary artery disease, native vessel, without angina.  The patient continues on aspirin, a high intensity statin drug, and a beta-blocker.  He has lost over 10 pounds since his last visit by making some changes in his diet.  He is encouraged regarding his efforts today.  He will continue on his current  medicines without change and I will plan to see him back next year. 2. Mixed hyperlipidemia: The patient continues on treatment with rosuvastatin 40 mg daily.  He is doing well and his LFTs are reviewed today which are normal.  Will repeat labs next year.  COVID-19 Education: The signs and symptoms of COVID-19 were discussed with the patient and how to seek care for testing (follow up with PCP or arrange E-visit).  The importance of social distancing was discussed today.  Time:   Today, I have spent 20 minutes with the patient with telehealth technology discussing the above problems.     Medication Adjustments/Labs and Tests Ordered: Current medicines are reviewed at length with the patient today.  Concerns regarding medicines are outlined above.  Tests Ordered: No orders of the defined types were placed in this encounter.   Medication Changes: No orders of the defined types were placed in this encounter.   Disposition:  Follow up in 9 month(s)  Signed, Sherren Mocha, MD  03/11/2019 8:07 AM    [place]

## 2019-03-11 NOTE — Addendum Note (Signed)
Addended by: Harland German A on: 03/11/2019 08:35 AM   Modules accepted: Orders

## 2019-03-11 NOTE — Patient Instructions (Signed)
Medication Instructions:  Your provider recommends that you continue on your current medications as directed. Please refer to the Current Medication list given to you today.    Labwork: Your provider recommends that you return for fasting lab work prior to your appointment next year  Follow-Up: Your provider wants you to follow-up in: 9 or so months with Dr. Burt Knack. You will receive a reminder letter in the mail two months in advance. If you don't receive a letter, please call our office to schedule the follow-up appointment.

## 2019-03-17 ENCOUNTER — Other Ambulatory Visit: Payer: Self-pay | Admitting: Cardiovascular Disease

## 2019-03-28 ENCOUNTER — Ambulatory Visit: Payer: Medicare Other | Admitting: Cardiovascular Disease

## 2019-03-31 DIAGNOSIS — J029 Acute pharyngitis, unspecified: Secondary | ICD-10-CM | POA: Diagnosis not present

## 2019-04-01 DIAGNOSIS — N401 Enlarged prostate with lower urinary tract symptoms: Secondary | ICD-10-CM | POA: Diagnosis not present

## 2019-04-01 DIAGNOSIS — R972 Elevated prostate specific antigen [PSA]: Secondary | ICD-10-CM | POA: Diagnosis not present

## 2019-04-01 DIAGNOSIS — Z8551 Personal history of malignant neoplasm of bladder: Secondary | ICD-10-CM | POA: Diagnosis not present

## 2019-04-01 DIAGNOSIS — R351 Nocturia: Secondary | ICD-10-CM | POA: Diagnosis not present

## 2019-05-23 DIAGNOSIS — I2510 Atherosclerotic heart disease of native coronary artery without angina pectoris: Secondary | ICD-10-CM | POA: Diagnosis not present

## 2019-05-23 DIAGNOSIS — E785 Hyperlipidemia, unspecified: Secondary | ICD-10-CM | POA: Diagnosis not present

## 2019-05-23 DIAGNOSIS — Z8551 Personal history of malignant neoplasm of bladder: Secondary | ICD-10-CM | POA: Diagnosis not present

## 2019-05-23 DIAGNOSIS — I10 Essential (primary) hypertension: Secondary | ICD-10-CM | POA: Diagnosis not present

## 2019-06-03 DIAGNOSIS — E875 Hyperkalemia: Secondary | ICD-10-CM | POA: Diagnosis not present

## 2019-07-08 DIAGNOSIS — D751 Secondary polycythemia: Secondary | ICD-10-CM

## 2019-07-08 DIAGNOSIS — D45 Polycythemia vera: Secondary | ICD-10-CM | POA: Diagnosis not present

## 2019-07-08 DIAGNOSIS — D72829 Elevated white blood cell count, unspecified: Secondary | ICD-10-CM

## 2019-07-21 DIAGNOSIS — Z23 Encounter for immunization: Secondary | ICD-10-CM | POA: Diagnosis not present

## 2019-07-21 DIAGNOSIS — W278XXA Contact with other nonpowered hand tool, initial encounter: Secondary | ICD-10-CM | POA: Diagnosis not present

## 2019-07-21 DIAGNOSIS — T148XXA Other injury of unspecified body region, initial encounter: Secondary | ICD-10-CM | POA: Diagnosis not present

## 2019-07-29 DIAGNOSIS — R972 Elevated prostate specific antigen [PSA]: Secondary | ICD-10-CM | POA: Diagnosis not present

## 2019-08-08 DIAGNOSIS — Z0000 Encounter for general adult medical examination without abnormal findings: Secondary | ICD-10-CM | POA: Diagnosis not present

## 2019-08-08 DIAGNOSIS — Z23 Encounter for immunization: Secondary | ICD-10-CM | POA: Diagnosis not present

## 2019-08-22 DIAGNOSIS — Z0000 Encounter for general adult medical examination without abnormal findings: Secondary | ICD-10-CM | POA: Diagnosis not present

## 2019-08-22 DIAGNOSIS — Z136 Encounter for screening for cardiovascular disorders: Secondary | ICD-10-CM | POA: Diagnosis not present

## 2019-08-28 DIAGNOSIS — Z136 Encounter for screening for cardiovascular disorders: Secondary | ICD-10-CM | POA: Diagnosis not present

## 2019-08-28 DIAGNOSIS — Z87891 Personal history of nicotine dependence: Secondary | ICD-10-CM | POA: Diagnosis not present

## 2019-09-08 DIAGNOSIS — D45 Polycythemia vera: Secondary | ICD-10-CM | POA: Diagnosis not present

## 2019-09-22 DIAGNOSIS — H6123 Impacted cerumen, bilateral: Secondary | ICD-10-CM | POA: Diagnosis not present

## 2019-09-30 DIAGNOSIS — M79671 Pain in right foot: Secondary | ICD-10-CM | POA: Diagnosis not present

## 2019-10-03 DIAGNOSIS — H3589 Other specified retinal disorders: Secondary | ICD-10-CM | POA: Diagnosis not present

## 2019-10-03 DIAGNOSIS — H40013 Open angle with borderline findings, low risk, bilateral: Secondary | ICD-10-CM | POA: Diagnosis not present

## 2019-10-03 DIAGNOSIS — H43813 Vitreous degeneration, bilateral: Secondary | ICD-10-CM | POA: Diagnosis not present

## 2019-10-03 DIAGNOSIS — H47233 Glaucomatous optic atrophy, bilateral: Secondary | ICD-10-CM | POA: Diagnosis not present

## 2019-10-08 DIAGNOSIS — R972 Elevated prostate specific antigen [PSA]: Secondary | ICD-10-CM | POA: Diagnosis not present

## 2019-11-07 DIAGNOSIS — D72829 Elevated white blood cell count, unspecified: Secondary | ICD-10-CM | POA: Diagnosis not present

## 2019-11-07 DIAGNOSIS — D45 Polycythemia vera: Secondary | ICD-10-CM | POA: Diagnosis not present

## 2019-12-02 ENCOUNTER — Other Ambulatory Visit: Payer: Self-pay | Admitting: Cardiovascular Disease

## 2019-12-02 DIAGNOSIS — I2510 Atherosclerotic heart disease of native coronary artery without angina pectoris: Secondary | ICD-10-CM | POA: Diagnosis not present

## 2019-12-02 DIAGNOSIS — E782 Mixed hyperlipidemia: Secondary | ICD-10-CM | POA: Diagnosis not present

## 2019-12-03 LAB — COMPREHENSIVE METABOLIC PANEL
ALT: 19 IU/L (ref 0–44)
AST: 26 IU/L (ref 0–40)
Albumin/Globulin Ratio: 2.1 (ref 1.2–2.2)
Albumin: 4.5 g/dL (ref 3.7–4.7)
Alkaline Phosphatase: 88 IU/L (ref 39–117)
BUN/Creatinine Ratio: 13 (ref 10–24)
BUN: 12 mg/dL (ref 8–27)
Bilirubin Total: 0.6 mg/dL (ref 0.0–1.2)
CO2: 25 mmol/L (ref 20–29)
Calcium: 9.6 mg/dL (ref 8.6–10.2)
Chloride: 102 mmol/L (ref 96–106)
Creatinine, Ser: 0.93 mg/dL (ref 0.76–1.27)
GFR calc Af Amer: 94 mL/min/{1.73_m2} (ref >59)
GFR calc non Af Amer: 81 mL/min/{1.73_m2} (ref >59)
Globulin, Total: 2.1 g/dL (ref 1.5–4.5)
Glucose: 75 mg/dL (ref 65–99)
Potassium: 5.6 mmol/L — ABNORMAL HIGH (ref 3.5–5.2)
Sodium: 143 mmol/L (ref 134–144)
Total Protein: 6.6 g/dL (ref 6.0–8.5)

## 2019-12-03 LAB — LIPID PANEL
Chol/HDL Ratio: 2.8 ratio (ref 0.0–5.0)
Cholesterol, Total: 121 mg/dL (ref 100–199)
HDL: 44 mg/dL (ref >39)
LDL Chol Calc (NIH): 57 mg/dL (ref 0–99)
Triglycerides: 110 mg/dL (ref 0–149)
VLDL Cholesterol Cal: 20 mg/dL (ref 5–40)

## 2019-12-11 ENCOUNTER — Encounter: Payer: Self-pay | Admitting: Cardiovascular Disease

## 2019-12-11 ENCOUNTER — Ambulatory Visit: Payer: Medicare Other | Admitting: Cardiovascular Disease

## 2019-12-11 ENCOUNTER — Other Ambulatory Visit: Payer: Self-pay

## 2019-12-11 VITALS — BP 132/86 | HR 93 | Ht 71.0 in | Wt 253.8 lb

## 2019-12-11 DIAGNOSIS — I10 Essential (primary) hypertension: Secondary | ICD-10-CM

## 2019-12-11 DIAGNOSIS — E782 Mixed hyperlipidemia: Secondary | ICD-10-CM

## 2019-12-11 DIAGNOSIS — I25119 Atherosclerotic heart disease of native coronary artery with unspecified angina pectoris: Secondary | ICD-10-CM

## 2019-12-11 LAB — BASIC METABOLIC PANEL
BUN/Creatinine Ratio: 6 — ABNORMAL LOW (ref 10–24)
BUN: 7 mg/dL — ABNORMAL LOW (ref 8–27)
CO2: 24 mmol/L (ref 20–29)
Calcium: 8.8 mg/dL (ref 8.6–10.2)
Chloride: 99 mmol/L (ref 96–106)
Creatinine, Ser: 1.14 mg/dL (ref 0.76–1.27)
GFR calc Af Amer: 73 mL/min/{1.73_m2} (ref >59)
GFR calc non Af Amer: 63 mL/min/{1.73_m2} (ref >59)
Glucose: 95 mg/dL (ref 65–99)
Potassium: 4.5 mmol/L (ref 3.5–5.2)
Sodium: 138 mmol/L (ref 134–144)

## 2019-12-11 NOTE — Patient Instructions (Signed)
Medication Instructions:  Your provider recommends that you continue on your current medications as directed. Please refer to the Current Medication list given to you today.   *If you need a refill on your cardiac medications before your next appointment, please call your pharmacy*  Lab Work: TODAY: BMET If you have labs (blood work) drawn today and your tests are completely normal, you will receive your results only by: Marland Kitchen MyChart Message (if you have MyChart) OR . A paper copy in the mail If you have any lab test that is abnormal or we need to change your treatment, we will call you to review the results.  Testing/Procedures: Your provider has requested that you have a lexiscan myoview. For further information please visit [email or web address]. Please follow instruction sheet, as given.  Follow-Up: At [facility], you and your health needs are our priority.  As part of our continuing mission to provide you with exceptional heart care, we have created designated Provider Care Teams.  These Care Teams include your primary Cardiologist (physician) and Advanced Practice Providers (APPs -  Physician Assistants and Nurse Practitioners) who all work together to provide you with the care you need, when you need it.  Your next appointment:   9 month(s)  The format for your next appointment:   In Person  Provider:   You may see Sherren Mocha, MD or one of the following Advanced Practice Providers on your designated Care Team:    Richardson Dopp, PA-C  Vin Radisson, [place]  Daune Perch, [place]

## 2019-12-11 NOTE — Progress Notes (Signed)
Cardiology Office Note:    Date:  12/11/2019   ID:  Leonard Washington, DOB 13-Jun-1946, MRN [id number]  PCP:  [name], MD  Cardiologist:  Sherren Mocha, MD  Electrophysiologist:  None   Referring MD: [name], MD   Chief Complaint  Patient presents with  . Coronary Artery Disease    History of Present Illness:    Leonard Washington is a 74 y.o. male with a hx of history of coronary artery disease status post four-vessel CABG in 2005.  At that time the patient was treated with a LIMA to LAD, left radial artery to RCA, saphenous vein graft to ramus intermedius, and saphenous vein graft to left circumflex marginal.  He has done well with no recurrent ischemic events since his bypass surgery.  The patient is here alone today.  He has been doing reasonably well.  He denies shortness of breath when walking at a normal pace.  He denies exertional chest pain or pressure with that same level of activity.  He has been doing some sit ups/crunches recently and he does note exertional intolerance of this activity.  States that he feels weak especially after his third set and he has to sit and rest before standing up.  He has been concerned that something might be wrong with his heart.  There is mild associated dyspnea.  He denies orthopnea, PND, or heart palpitations.   Past Medical History:  Diagnosis Date  . BPH (benign prostatic hyperplasia)   . Cataract 2016   Had Bil surgery  . Chest congestion 11/29/2018  . Coronary atherosclerosis of native coronary artery    Vessel CABG 2005  . Hyperlipidemia   . Hypertension   . Myocardial infarction [facility]) 1997   Had stent put in 197/CABG in2005  . Shoulder pain    right  side  . SOB (shortness of breath) on exertion     Past Surgical History:  Procedure Laterality Date  . ARTERIAL DUPLEX  04/19/2004   Carotid-no evidence of significant plaque noted, no evidence of significant ICA stenosis, artery flow is antegrade. Upper  extremities-within normal limits. Lower extremities-no evidence of lower extremity arterial occlusive disease at rest  . BLADDER SURGERY     had a growth in bladder/benign  . CARDIAC CATHETERIZATION  04/19/2004   severe four vessel coronary disease, continue vigorous medical therapy, recommendations for multivessel bypass grafting  . CARDIOVASCULAR STRESS TEST  07/01/2009   normal pattern of perfusion in all regions, no scintigraphic evidence of inducible myocardial ischemia,EKG negative for ischemia, no ECG changes,  . CATARACT EXTRACTION, BILATERAL    . CORONARY ARTERY BYPASS GRAFT  04/20/2004   x4. LIMA to LAD, left radial artery to RCA, SVG to ramus intermedius, SVG to circ.  Marland Kitchen DOPPLER ECHOCARDIOGRAPHY  11/11/2012   EF 55-60%, wall motion normal, no regional wall motion abnormalities, thickness of ventricular septum was mildly increased, mild regurg of the mitral valve    Current Medications: No outpatient medications have been marked as taking for the 12/11/19 encounter (Office Visit) with Sherren Mocha, MD.     Allergies:   Zocor [simvastatin]   Social History   Socioeconomic History  . Marital status: Married    Spouse name: Not on file  . Number of children: Not on file  . Years of education: Not on file  . Highest education level: Not on file  Occupational History  . Not on file  Tobacco Use  . Smoking status: Former Smoker  Quit date: 04/17/1979    Years since quitting: 40.6  . Smokeless tobacco: Never Used  Substance and Sexual Activity  . Alcohol use: Yes    Alcohol/week: 12.0 standard drinks    Types: 12 Cans of beer per week  . Drug use: No  . Sexual activity: Not on file  Other Topics Concern  . Not on file  Social History Narrative   The patient is married. He lives at [facility]. Retired since 2012. Remote smoker.   Social Determinants of Health   Financial Resource Strain:   . Difficulty of Paying Living Expenses: Not on file  Food Insecurity:   .  Worried About Charity fundraiser in the Last Year: Not on file  . Ran Out of Food in the Last Year: Not on file  Transportation Needs:   . Lack of Transportation (Medical): Not on file  . Lack of Transportation (Non-Medical): Not on file  Physical Activity:   . Days of Exercise per Week: Not on file  . Minutes of Exercise per Session: Not on file  Stress:   . Feeling of Stress : Not on file  Social Connections:   . Frequency of Communication with Friends and Family: Not on file  . Frequency of Social Gatherings with Friends and Family: Not on file  . Attends Religious Services: Not on file  . Active Member of Clubs or Organizations: Not on file  . Attends Archivist Meetings: Not on file  . Marital Status: Not on file     Family History: The patient's family history includes Colon cancer in his maternal uncle; Heart attack in his maternal grandfather, maternal uncle, maternal uncle, and maternal uncle; Heart disease in his sister.  ROS:   Please see the history of present illness.    All other systems reviewed and are negative.  EKGs/Labs/Other Studies Reviewed:    The following studies were reviewed today: Echo 07/27/2016: Study Conclusions   - Procedure narrative: Transthoracic echocardiography. Image  quality was suboptimal. The study was technically difficult.  Intravenous contrast (Definity) was administered.  - Left ventricle: The cavity size was normal. Wall thickness was  normal. Systolic function was normal. The estimated ejection  fraction was in the range of 55% to 60%. Possible hypokinesis of  the basal-midinferior myocardium. Features are consistent with a  pseudonormal left ventricular filling pattern, with concomitant  abnormal relaxation and increased filling pressure (grade 2  diastolic dysfunction).  - Aortic valve: Trileaflet; moderately thickened, mildly calcified  leaflets.  - Left atrium: The atrium was mildly dilated.  -  Right ventricle: The cavity size was mildly dilated. Wall  thickness was normal.  - Tricuspid valve: There was trivial regurgitation.   EKG:  EKG is ordered today.  The ekg ordered today demonstrates NSR 93 bpm,   Recent Labs: 12/02/2019: ALT 19; BUN 12; Creatinine, Ser 0.93; Potassium 5.6; Sodium 143  Recent Lipid Panel    Component Value Date/Time   CHOL 121 12/02/2019 0953   TRIG 110 12/02/2019 0953   HDL 44 12/02/2019 0953   CHOLHDL 2.8 12/02/2019 0953   CHOLHDL 3.1 07/13/2016 0832   VLDL 21 07/13/2016 0832   LDLCALC 57 12/02/2019 0953    Physical Exam:    VS:  BP 132/86   Pulse 93   Ht 5\' 11"  (1.803 m)   Wt 253 lb 12.8 oz (115.1 kg)   SpO2 95%   BMI 35.40 kg/m     Wt Readings from Last 3  Encounters:  12/11/19 253 lb 12.8 oz (115.1 kg)  03/11/19 246 lb 3.2 oz (111.7 kg)  12/13/18 259 lb (117.5 kg)     GEN:  Well nourished, well developed in no acute distress HEENT: Normal NECK: No JVD; No carotid bruits LYMPHATICS: No lymphadenopathy CARDIAC: RRR, no murmurs, rubs, gallops RESPIRATORY:  Clear to auscultation without rales, wheezing or rhonchi  ABDOMEN: Soft, non-tender, non-distended MUSCULOSKELETAL:  No edema; No deformity  SKIN: Warm and dry NEUROLOGIC:  Alert and oriented x 3 PSYCHIATRIC:  Normal affect   ASSESSMENT:    1. Coronary artery disease involving native heart with angina pectoris, unspecified vessel or lesion type ([place])   2. Essential hypertension   3. Mixed hyperlipidemia    PLAN:    In order of problems listed above:  1. The patient is 16 years out from CABG.  He does have some vague exertional symptoms as outlined.  He did not have typical angina at the time of his bypass surgery.  I have recommended a Lexiscan Myoview stress test for further evaluation of ischemia.  He otherwise will continue on his current medical program. 2. Blood pressure is controlled on atenolol. 3. Treated with a high intensity statin drug using rosuvastatin 40 mg  daily.  Cholesterol 121, HDL 44, LDL 57 mg/dL.   Medication Adjustments/Labs and Tests Ordered: Current medicines are reviewed at length with the patient today.  Concerns regarding medicines are outlined above.  Orders Placed This Encounter  Procedures  . Basic metabolic panel  . MYOCARDIAL PERFUSION IMAGING  . EKG 12-Lead   No orders of the defined types were placed in this encounter.   Patient Instructions  Medication Instructions:  Your provider recommends that you continue on your current medications as directed. Please refer to the Current Medication list given to you today.   *If you need a refill on your cardiac medications before your next appointment, please call your pharmacy*  Lab Work: TODAY: BMET If you have labs (blood work) drawn today and your tests are completely normal, you will receive your results only by: Marland Kitchen MyChart Message (if you have MyChart) OR . A paper copy in the mail If you have any lab test that is abnormal or we need to change your treatment, we will call you to review the results.  Testing/Procedures: Your provider has requested that you have a lexiscan myoview. For further information please visit [email or web address]. Please follow instruction sheet, as given.  Follow-Up: At [facility], you and your health needs are our priority.  As part of our continuing mission to provide you with exceptional heart care, we have created designated Provider Care Teams.  These Care Teams include your primary Cardiologist (physician) and Advanced Practice Providers (APPs -  Physician Assistants and Nurse Practitioners) who all work together to provide you with the care you need, when you need it.  Your next appointment:   9 month(s)  The format for your next appointment:   In Person  Provider:   You may see Sherren Mocha, MD or one of the following Advanced Practice Providers on your designated Care Team:    Richardson Dopp, PA-C  [place] Baker, PA-C  Daune Perch, [place]    Signed, Sherren Mocha, MD  12/11/2019 3:02 PM    [place]

## 2019-12-12 ENCOUNTER — Ambulatory Visit: Payer: Medicare Other | Admitting: Cardiovascular Disease

## 2019-12-22 DIAGNOSIS — D075 Carcinoma in situ of prostate: Secondary | ICD-10-CM | POA: Diagnosis not present

## 2019-12-22 DIAGNOSIS — R972 Elevated prostate specific antigen [PSA]: Secondary | ICD-10-CM | POA: Diagnosis not present

## 2019-12-26 DIAGNOSIS — J019 Acute sinusitis, unspecified: Secondary | ICD-10-CM | POA: Diagnosis not present

## 2019-12-29 DIAGNOSIS — Z20828 Contact with and (suspected) exposure to other viral communicable diseases: Secondary | ICD-10-CM | POA: Diagnosis not present

## 2019-12-29 DIAGNOSIS — R0600 Dyspnea, unspecified: Secondary | ICD-10-CM | POA: Diagnosis not present

## 2019-12-29 DIAGNOSIS — J189 Pneumonia, unspecified organism: Secondary | ICD-10-CM | POA: Diagnosis not present

## 2019-12-29 DIAGNOSIS — J069 Acute upper respiratory infection, unspecified: Secondary | ICD-10-CM | POA: Diagnosis not present

## 2019-12-29 DIAGNOSIS — R0602 Shortness of breath: Secondary | ICD-10-CM | POA: Diagnosis not present

## 2019-12-29 DIAGNOSIS — R05 Cough: Secondary | ICD-10-CM | POA: Diagnosis not present

## 2019-12-29 DIAGNOSIS — I252 Old myocardial infarction: Secondary | ICD-10-CM | POA: Diagnosis not present

## 2019-12-29 DIAGNOSIS — R070 Pain in throat: Secondary | ICD-10-CM | POA: Diagnosis not present

## 2019-12-31 ENCOUNTER — Telehealth (HOSPITAL_COMMUNITY): Payer: Self-pay

## 2019-12-31 NOTE — Telephone Encounter (Signed)
Detailed instructions left on the patient's answering machine. Asked to call back with any questions. S.Elmira Olkowski EMTP 

## 2020-01-01 ENCOUNTER — Encounter (HOSPITAL_COMMUNITY): Payer: Medicare Other

## 2020-01-01 DIAGNOSIS — I2510 Atherosclerotic heart disease of native coronary artery without angina pectoris: Secondary | ICD-10-CM | POA: Diagnosis not present

## 2020-01-01 DIAGNOSIS — Z87891 Personal history of nicotine dependence: Secondary | ICD-10-CM | POA: Diagnosis not present

## 2020-01-01 DIAGNOSIS — I252 Old myocardial infarction: Secondary | ICD-10-CM | POA: Diagnosis not present

## 2020-01-01 DIAGNOSIS — D45 Polycythemia vera: Secondary | ICD-10-CM | POA: Diagnosis not present

## 2020-01-01 DIAGNOSIS — J189 Pneumonia, unspecified organism: Secondary | ICD-10-CM | POA: Diagnosis not present

## 2020-01-01 DIAGNOSIS — I214 Non-ST elevation (NSTEMI) myocardial infarction: Secondary | ICD-10-CM | POA: Diagnosis not present

## 2020-01-01 DIAGNOSIS — R079 Chest pain, unspecified: Secondary | ICD-10-CM | POA: Diagnosis not present

## 2020-01-02 ENCOUNTER — Inpatient Hospital Stay (HOSPITAL_COMMUNITY): Payer: Medicare Other

## 2020-01-02 ENCOUNTER — Encounter (HOSPITAL_COMMUNITY): Payer: Self-pay | Admitting: Internal Medicine

## 2020-01-02 ENCOUNTER — Other Ambulatory Visit: Payer: Self-pay

## 2020-01-02 ENCOUNTER — Inpatient Hospital Stay (HOSPITAL_COMMUNITY)
Admission: AD | Admit: 2020-01-02 | Discharge: 2020-01-07 | DRG: 177 | Disposition: A | Discharge status: Home / Self Care | Payer: Medicare Other | Source: Other Acute Inpatient Hospital | Attending: Internal Medicine | Admitting: Internal Medicine

## 2020-01-02 DIAGNOSIS — R911 Solitary pulmonary nodule: Secondary | ICD-10-CM | POA: Diagnosis present

## 2020-01-02 DIAGNOSIS — Z800 Family history of malignant neoplasm of digestive organs: Secondary | ICD-10-CM | POA: Diagnosis not present

## 2020-01-02 DIAGNOSIS — Z9841 Cataract extraction status, right eye: Secondary | ICD-10-CM | POA: Diagnosis not present

## 2020-01-02 DIAGNOSIS — Z7952 Long term (current) use of systemic steroids: Secondary | ICD-10-CM | POA: Diagnosis not present

## 2020-01-02 DIAGNOSIS — Z87891 Personal history of nicotine dependence: Secondary | ICD-10-CM

## 2020-01-02 DIAGNOSIS — I2510 Atherosclerotic heart disease of native coronary artery without angina pectoris: Secondary | ICD-10-CM | POA: Diagnosis present

## 2020-01-02 DIAGNOSIS — Z7982 Long term (current) use of aspirin: Secondary | ICD-10-CM | POA: Diagnosis not present

## 2020-01-02 DIAGNOSIS — R9431 Abnormal electrocardiogram [ECG] [EKG]: Secondary | ICD-10-CM

## 2020-01-02 DIAGNOSIS — E669 Obesity, unspecified: Secondary | ICD-10-CM | POA: Diagnosis present

## 2020-01-02 DIAGNOSIS — Z6833 Body mass index (BMI) 33.0-33.9, adult: Secondary | ICD-10-CM | POA: Diagnosis not present

## 2020-01-02 DIAGNOSIS — U071 COVID-19: Secondary | ICD-10-CM | POA: Diagnosis not present

## 2020-01-02 DIAGNOSIS — Z8249 Family history of ischemic heart disease and other diseases of the circulatory system: Secondary | ICD-10-CM

## 2020-01-02 DIAGNOSIS — F419 Anxiety disorder, unspecified: Secondary | ICD-10-CM | POA: Diagnosis present

## 2020-01-02 DIAGNOSIS — I214 Non-ST elevation (NSTEMI) myocardial infarction: Secondary | ICD-10-CM | POA: Diagnosis not present

## 2020-01-02 DIAGNOSIS — R000 Tachycardia, unspecified: Secondary | ICD-10-CM

## 2020-01-02 DIAGNOSIS — Z951 Presence of aortocoronary bypass graft: Secondary | ICD-10-CM

## 2020-01-02 DIAGNOSIS — Z955 Presence of coronary angioplasty implant and graft: Secondary | ICD-10-CM | POA: Diagnosis not present

## 2020-01-02 DIAGNOSIS — F0390 Unspecified dementia without behavioral disturbance: Secondary | ICD-10-CM | POA: Diagnosis not present

## 2020-01-02 DIAGNOSIS — I480 Paroxysmal atrial fibrillation: Secondary | ICD-10-CM | POA: Diagnosis not present

## 2020-01-02 DIAGNOSIS — R0602 Shortness of breath: Secondary | ICD-10-CM

## 2020-01-02 DIAGNOSIS — E785 Hyperlipidemia, unspecified: Secondary | ICD-10-CM | POA: Diagnosis not present

## 2020-01-02 DIAGNOSIS — R946 Abnormal results of thyroid function studies: Secondary | ICD-10-CM | POA: Diagnosis not present

## 2020-01-02 DIAGNOSIS — R7989 Other specified abnormal findings of blood chemistry: Secondary | ICD-10-CM | POA: Diagnosis present

## 2020-01-02 DIAGNOSIS — D751 Secondary polycythemia: Secondary | ICD-10-CM | POA: Diagnosis present

## 2020-01-02 DIAGNOSIS — J1282 Pneumonia due to coronavirus disease 2019: Secondary | ICD-10-CM | POA: Diagnosis not present

## 2020-01-02 DIAGNOSIS — I4891 Unspecified atrial fibrillation: Secondary | ICD-10-CM | POA: Diagnosis not present

## 2020-01-02 DIAGNOSIS — I252 Old myocardial infarction: Secondary | ICD-10-CM

## 2020-01-02 DIAGNOSIS — Z9842 Cataract extraction status, left eye: Secondary | ICD-10-CM | POA: Diagnosis not present

## 2020-01-02 DIAGNOSIS — Z888 Allergy status to other drugs, medicaments and biological substances status: Secondary | ICD-10-CM

## 2020-01-02 DIAGNOSIS — I10 Essential (primary) hypertension: Secondary | ICD-10-CM

## 2020-01-02 DIAGNOSIS — Z79899 Other long term (current) drug therapy: Secondary | ICD-10-CM

## 2020-01-02 DIAGNOSIS — I440 Atrioventricular block, first degree: Secondary | ICD-10-CM | POA: Diagnosis not present

## 2020-01-02 LAB — CBC WITH DIFFERENTIAL/PLATELET
Abs Immature Granulocytes: 0.23 10*3/uL — ABNORMAL HIGH (ref 0.00–0.07)
Basophils Absolute: 0.1 10*3/uL (ref 0.0–0.1)
Basophils Relative: 0 %
Eosinophils Absolute: 0 10*3/uL (ref 0.0–0.5)
Eosinophils Relative: 0 %
HCT: 41.1 % (ref 39.0–52.0)
Hemoglobin: 12.4 g/dL — ABNORMAL LOW (ref 13.0–17.0)
Immature Granulocytes: 1 %
Lymphocytes Relative: 4 %
Lymphs Abs: 0.8 10*3/uL (ref 0.7–4.0)
MCH: 23.7 pg — ABNORMAL LOW (ref 26.0–34.0)
MCHC: 30.2 g/dL (ref 30.0–36.0)
MCV: 78.6 fL — ABNORMAL LOW (ref 80.0–100.0)
Monocytes Absolute: 1.4 10*3/uL — ABNORMAL HIGH (ref 0.1–1.0)
Monocytes Relative: 7 %
Neutro Abs: 17.6 10*3/uL — ABNORMAL HIGH (ref 1.7–7.7)
Neutrophils Relative %: 88 %
Platelets: 286 10*3/uL (ref 150–400)
RBC: 5.23 MIL/uL (ref 4.22–5.81)
RDW: 19.1 % — ABNORMAL HIGH (ref 11.5–15.5)
WBC: 20.1 10*3/uL — ABNORMAL HIGH (ref 4.0–10.5)
nRBC: 0 % (ref 0.0–0.2)

## 2020-01-02 LAB — COMPREHENSIVE METABOLIC PANEL
ALT: 37 U/L (ref 0–44)
AST: 65 U/L — ABNORMAL HIGH (ref 15–41)
Albumin: 2 g/dL — ABNORMAL LOW (ref 3.5–5.0)
Alkaline Phosphatase: 72 U/L (ref 38–126)
Anion gap: 10 (ref 5–15)
BUN: 13 mg/dL (ref 8–23)
CO2: 27 mmol/L (ref 22–32)
Calcium: 8.2 mg/dL — ABNORMAL LOW (ref 8.9–10.3)
Chloride: 99 mmol/L (ref 98–111)
Creatinine, Ser: 1.12 mg/dL (ref 0.61–1.24)
GFR calc Af Amer: >60 mL/min (ref >60)
GFR calc non Af Amer: >60 mL/min (ref >60)
Glucose, Bld: 122 mg/dL — ABNORMAL HIGH (ref 70–99)
Potassium: 3.9 mmol/L (ref 3.5–5.1)
Sodium: 136 mmol/L (ref 135–145)
Total Bilirubin: 1 mg/dL (ref 0.3–1.2)
Total Protein: 5.5 g/dL — ABNORMAL LOW (ref 6.5–8.1)

## 2020-01-02 LAB — LACTATE DEHYDROGENASE: LDH: 265 U/L — ABNORMAL HIGH (ref 98–192)

## 2020-01-02 LAB — HEPARIN LEVEL (UNFRACTIONATED)
Heparin Unfractionated: <0.1 IU/mL — ABNORMAL LOW (ref 0.30–0.70)
Heparin Unfractionated: 0.11 IU/mL — ABNORMAL LOW (ref 0.30–0.70)
Heparin Unfractionated: 0.15 IU/mL — ABNORMAL LOW (ref 0.30–0.70)

## 2020-01-02 LAB — TROPONIN I (HIGH SENSITIVITY)
Troponin I (High Sensitivity): 1108 ng/L (ref <18)
Troponin I (High Sensitivity): 993 ng/L (ref <18)

## 2020-01-02 LAB — C-REACTIVE PROTEIN: CRP: 21.3 mg/dL — ABNORMAL HIGH (ref <1.0)

## 2020-01-02 LAB — FERRITIN: Ferritin: 194 ng/mL (ref 24–336)

## 2020-01-02 LAB — D-DIMER, QUANTITATIVE: D-Dimer, Quant: 3.87 ug/mL-FEU — ABNORMAL HIGH (ref 0.00–0.50)

## 2020-01-02 LAB — PROCALCITONIN: Procalcitonin: 0.34 ng/mL

## 2020-01-02 LAB — MRSA PCR SCREENING: MRSA by PCR: NEGATIVE

## 2020-01-02 MED ORDER — SODIUM CHLORIDE 0.9 % IV SOLN: 200.0000 mg | Freq: Once | INTRAVENOUS | Status: DC

## 2020-01-02 MED ORDER — SODIUM CHLORIDE 0.9 % IV SOLN
100.0000 mg | Freq: Every day | INTRAVENOUS | Status: AC
  Administered 2020-01-02 – 2020-01-05 (×4): 100 mg via INTRAVENOUS
  Filled 2020-01-02 (×4): qty 20

## 2020-01-02 MED ORDER — SODIUM CHLORIDE 0.9 % IV SOLN: 100.0000 mg | Freq: Every day | INTRAVENOUS | Status: DC

## 2020-01-02 MED ORDER — SODIUM CHLORIDE 0.9% FLUSH
3.0000 mL | Freq: Two times a day (BID) | INTRAVENOUS | Status: DC
  Administered 2020-01-02 – 2020-01-06 (×10): 3 mL via INTRAVENOUS

## 2020-01-02 MED ORDER — ROSUVASTATIN CALCIUM 20 MG PO TABS
40.0000 mg | ORAL_TABLET | Freq: Every day | ORAL | Status: DC
  Administered 2020-01-02 – 2020-01-06 (×5): 40 mg via ORAL
  Filled 2020-01-02 (×5): qty 2

## 2020-01-02 MED ORDER — SODIUM CHLORIDE 0.9% FLUSH: 3.0000 mL | INTRAVENOUS | Status: DC | PRN

## 2020-01-02 MED ORDER — ASCORBIC ACID 500 MG PO TABS
500.0000 mg | ORAL_TABLET | Freq: Every day | ORAL | Status: DC
  Administered 2020-01-02 – 2020-01-07 (×6): 500 mg via ORAL
  Filled 2020-01-02 (×6): qty 1

## 2020-01-02 MED ORDER — DEXAMETHASONE SODIUM PHOSPHATE 10 MG/ML IJ SOLN
6.0000 mg | INTRAMUSCULAR | Status: DC
  Administered 2020-01-02 – 2020-01-05 (×4): 6 mg via INTRAVENOUS
  Filled 2020-01-02 (×4): qty 1

## 2020-01-02 MED ORDER — HEPARIN (PORCINE) 25000 UT/250ML-% IV SOLN
2100.0000 [IU]/h | INTRAVENOUS | Status: DC
  Administered 2020-01-02 (×2): 1500 [IU]/h via INTRAVENOUS
  Administered 2020-01-03: 1800 [IU]/h via INTRAVENOUS
  Filled 2020-01-02 (×2): qty 250

## 2020-01-02 MED ORDER — PRO-STAT SUGAR FREE PO LIQD
30.0000 mL | Freq: Every day | ORAL | Status: DC
  Administered 2020-01-02 – 2020-01-07 (×6): 30 mL via ORAL
  Filled 2020-01-02 (×6): qty 30

## 2020-01-02 MED ORDER — SODIUM CHLORIDE 0.9 % IV SOLN: 250.0000 mL | INTRAVENOUS | Status: DC | PRN

## 2020-01-02 MED ORDER — HEPARIN BOLUS VIA INFUSION
3000.0000 [IU] | Freq: Once | INTRAVENOUS | Status: AC
  Administered 2020-01-02: 3000 [IU] via INTRAVENOUS
  Filled 2020-01-02: qty 3000

## 2020-01-02 MED ORDER — IOHEXOL 350 MG/ML SOLN
75.0000 mL | Freq: Once | INTRAVENOUS | Status: AC | PRN
  Administered 2020-01-02: 75 mL via INTRAVENOUS

## 2020-01-02 MED ORDER — HEPARIN BOLUS VIA INFUSION
2250.0000 [IU] | Freq: Once | INTRAVENOUS | Status: AC
  Administered 2020-01-02: 2250 [IU] via INTRAVENOUS
  Filled 2020-01-02: qty 2250

## 2020-01-02 MED ORDER — ZINC SULFATE 220 (50 ZN) MG PO CAPS
220.0000 mg | ORAL_CAPSULE | Freq: Every day | ORAL | Status: DC
  Administered 2020-01-02 – 2020-01-07 (×6): 220 mg via ORAL
  Filled 2020-01-02 (×6): qty 1

## 2020-01-02 MED ORDER — ATENOLOL 50 MG PO TABS
25.0000 mg | ORAL_TABLET | Freq: Every day | ORAL | Status: DC
  Administered 2020-01-02: 25 mg via ORAL
  Filled 2020-01-02 (×2): qty 1

## 2020-01-02 MED ORDER — ASPIRIN 81 MG PO CHEW
81.0000 mg | CHEWABLE_TABLET | Freq: Every day | ORAL | Status: DC
  Administered 2020-01-02 – 2020-01-03 (×2): 81 mg via ORAL
  Filled 2020-01-02 (×2): qty 1

## 2020-01-02 MED ORDER — ENSURE ENLIVE PO LIQD
237.0000 mL | Freq: Two times a day (BID) | ORAL | Status: DC
  Administered 2020-01-02 – 2020-01-05 (×6): 237 mL via ORAL

## 2020-01-02 MED ORDER — HYDROXYUREA 500 MG PO CAPS
500.0000 mg | ORAL_CAPSULE | Freq: Every day | ORAL | Status: DC
  Administered 2020-01-02 – 2020-01-07 (×6): 500 mg via ORAL
  Filled 2020-01-02 (×7): qty 1

## 2020-01-02 NOTE — Progress Notes (Signed)
PROGRESS NOTE                                                                                                                                                                                                             Patient Demographics:    Leonard Washington, is a 74 y.o. male, DOB - 06-Jun-1946, [id number]  Outpatient Primary MD for the patient is [name], MD   Admit date - 01/02/2020   LOS - 0  No chief complaint on file.      Brief Narrative: Patient is a 74 y.o. male with PMHx of CAD s/p CABG, polycythemia rubra-who was transferred from [facility] to [facility] earlier this morning for evaluation of shortness of breath likely secondary to COVID-19 pneumonia.  While at [facility] complained of left-sided chest pain-and had elevated troponin levels of around 1.15.  Significant Events: 3/5>> transfer from [facility]/admit to [facility] 3/5>> CTA chest-negative for PE  COVID-19 medications: Remdesivir: 3/4>> Steroids: 3/4>>  Microbiology data: None  Procedures: None  Consults: Cardiology   Subjective:    Leonard Washington today feels better-lying comfortably in bed.  Has not ambulated so he really does not know if he is short of breath.  He does not have chest pain this morning.   Assessment  & Plan :   Covid 19 Viral pneumonia: No shortness of breath this morning-stable on room air.  Has risk factors for severe disease-hence will continue steroids/remdesivir.  CRP remains significantly elevated.  Note-CTA chest on 3/5 - for PE.  Fever: afebrile  O2 requirements:  SpO2: 95 %   COVID-19 Labs: Recent Labs    01/02/20 0532  DDIMER 3.87*  FERRITIN 194  LDH 265*  CRP 21.3*    No results found for: BNP  Recent Labs  Lab 01/02/20 0532  PROCALCITON 0.34    No results found for: SARSCOV2NAA   Prone/Incentive Spirometry: encouraged  incentive spirometry use 3-4/hour.  DVT Prophylaxis  :   Heparin  infusion  Elevated troponin/chest pain: Did have very transient but atypical sounding chest pain yesterday while at [facility]  Per patient-his primary cardiologist had actually scheduled him for a nuclear stress test today.  Currently chest pain-free-troponin is elevated-but suspect this is mostly demand ischemia-awaiting echo-have consulted cardiology.  Await further recommendations.  In the meantime-continue with IV heparin infusion-have restarted statin and  beta-blocker.  Leukocytosis: Could be secondary to steroids-follow for now.  HTN: BP on the higher side-resume atenolol  HLD: Resume statin  Polycythemia rubra: Resume hydroxyurea-follow hemoglobin/hematocrit.  8 mm ground-glass opacity is noted laterally in the left upper lobe: Incidental finding on CT chest-ex-smoker-defer to outpatient MD-but repeat CT chest in 3 to 6 months.  Obesity: Estimated body mass index is 35.39 kg/m as calculated from the following:   Height as of this encounter: 5\' 11"  (1.803 m).   Weight as of this encounter: 115.1 kg.    ABG: No results found for: PHART, PCO2ART, PO2ART, HCO3, TCO2, ACIDBASEDEF, O2SAT  Vent Settings: N/A  Condition -Guarded  Family Communication  :  Spouse updated over the phone on 3/5  Code Status :  Full Code  Diet :  Diet Order            Diet Heart Room service appropriate? Yes; Fluid consistency: Thin  Diet effective now               Disposition Plan  :  Remain hospitalized-Home when medically stable  Barriers to discharge: Complete 5 days of IV Remdesivir  Antimicorbials  :    Anti-infectives (From admission, onward)   Start     Dose/Rate Route Frequency Ordered Stop   01/03/20 1000  remdesivir 100 mg in sodium chloride 0.9 % 100 mL IVPB  Status:  Discontinued     100 mg 200 mL/hr over 30 Minutes Intravenous Daily 01/02/20 0430 01/02/20 0432   01/02/20 1000  remdesivir 100 mg in sodium chloride 0.9 % 100 mL IVPB     100 mg 200 mL/hr over 30  Minutes Intravenous Daily 01/02/20 0434 01/06/20 0959   01/02/20 0430  remdesivir 200 mg in sodium chloride 0.9% 250 mL IVPB  Status:  Discontinued     200 mg 580 mL/hr over 30 Minutes Intravenous Once 01/02/20 0430 01/02/20 0432      Inpatient Medications  Scheduled Meds: . vitamin C  500 mg Oral Daily  . aspirin  81 mg Oral Daily  . atenolol  25 mg Oral Daily  . dexamethasone (DECADRON) injection  6 mg Intravenous Q24H  . feeding supplement (ENSURE ENLIVE)  237 mL Oral BID BM  . rosuvastatin  40 mg Oral q1800  . sodium chloride flush  3 mL Intravenous Q12H  . zinc sulfate  220 mg Oral Daily   Continuous Infusions: . sodium chloride    . heparin 1,500 Units/hr (01/02/20 1008)  . remdesivir 100 mg in NS 100 mL     PRN Meds:.sodium chloride, sodium chloride flush   Time Spent in minutes  35   See all Orders from today for further details   Oren Binet M.D on 01/02/2020 at 12:27 PM  To page go to [email or web address] - use universal password  [facility] -  Office  [phone]    Objective:   Vitals:   01/02/20 0301 01/02/20 0400 01/02/20 0600 01/02/20 0850  BP:  111/61 (!) 109/58 121/69  Pulse:  86 83 85  Resp:  (!) 23 (!) 25 17  Temp: 99.7 F (37.6 C)   98 F (36.7 C)  TempSrc: Oral   Oral  SpO2:  93% 94% 95%  Weight:      Height:        Wt Readings from Last 3 Encounters:  01/02/20 115.1 kg  12/11/19 115.1 kg  03/11/19 111.7 kg     Intake/Output Summary (Last 24 hours) at 01/02/2020 1227 Last data  filed at 01/02/2020 1005 Gross per 24 hour  Intake 123 ml  Output --  Net 123 ml     Physical Exam Gen Exam:Alert awake-not in any distress HEENT:atraumatic, normocephalic Chest: B/L clear to auscultation anteriorly CVS:S1S2 regular Abdomen:soft non tender, non distended Extremities:no edema Neurology: Non focal Skin: no rash   Data Review:    CBC Recent Labs  Lab 01/02/20 0532  WBC 20.1*  HGB 12.4*  HCT 41.1  PLT 286  MCV 78.6*   MCH 23.7*  MCHC 30.2  RDW 19.1*  LYMPHSABS 0.8  MONOABS 1.4*  EOSABS 0.0  BASOSABS 0.1    Chemistries  Recent Labs  Lab 01/02/20 0532  NA 136  K 3.9  CL 99  CO2 27  GLUCOSE 122*  BUN 13  CREATININE 1.12  CALCIUM 8.2*  AST 65*  ALT 37  ALKPHOS 72  BILITOT 1.0   ------------------------------------------------------------------------------------------------------------------ No results for input(s): CHOL, HDL, LDLCALC, TRIG, CHOLHDL, LDLDIRECT in the last 72 hours.  Lab Results  Component Value Date   HGBA1C 5.0 07/13/2016   ------------------------------------------------------------------------------------------------------------------ No results for input(s): TSH, T4TOTAL, T3FREE, THYROIDAB in the last 72 hours.  Invalid input(s): FREET3 ------------------------------------------------------------------------------------------------------------------ Recent Labs    01/02/20 0532  FERRITIN 194    Coagulation profile No results for input(s): INR, PROTIME in the last 168 hours.  Recent Labs    01/02/20 0532  DDIMER 3.87*    Cardiac Enzymes No results for input(s): CKMB, TROPONINI, MYOGLOBIN in the last 168 hours.  Invalid input(s): CK ------------------------------------------------------------------------------------------------------------------ No results found for: BNP  Micro Results Recent Results (from the past 240 hour(s))  MRSA PCR Screening     Status: None   Collection Time: 01/02/20  5:45 AM   Specimen: Nasopharyngeal  Result Value Ref Range Status   MRSA by PCR NEGATIVE NEGATIVE Final    Comment:        The GeneXpert MRSA Assay (FDA approved for NASAL specimens only), is one component of a comprehensive MRSA colonization surveillance program. It is not intended to diagnose MRSA infection nor to guide or monitor treatment for MRSA infections. Performed at [facility], [place] [address] [place], [place] [address]      Radiology Reports CT ANGIO CHEST PE W OR WO CONTRAST  Result Date: 01/02/2020 CLINICAL DATA:  Shortness of breath, elevated D-dimer level. EXAM: CT ANGIOGRAPHY CHEST WITH CONTRAST TECHNIQUE: Multidetector CT imaging of the chest was performed using the standard protocol during bolus administration of intravenous contrast. Multiplanar CT image reconstructions and MIPs were obtained to evaluate the vascular anatomy. CONTRAST:  60 mL OMNIPAQUE IOHEXOL 350 MG/ML SOLN COMPARISON:  December 29, 2019. FINDINGS: Cardiovascular: Satisfactory opacification of the pulmonary arteries to the segmental level. No evidence of pulmonary embolism. Normal heart size. No pericardial effusion. Atherosclerosis of thoracic aorta is noted without aneurysm or dissection. Status post coronary bypass graft. Mediastinum/Nodes: No enlarged mediastinal, hilar, or axillary lymph nodes. Thyroid gland, trachea, and esophagus demonstrate no significant findings. Lungs/Pleura: No pneumothorax or pleural effusion is noted. Mild bibasilar subsegmental atelectasis is noted. 8 mm ground-glass opacity is noted laterally in the left upper lobe best seen on image number 28 of series 7 which was present on prior exam. Upper Abdomen: No acute abnormality. Musculoskeletal: No chest wall abnormality. No acute or significant osseous findings. Review of the MIP images confirms the above findings. IMPRESSION: 1. No definite evidence of pulmonary embolus. 2. Mild bibasilar subsegmental atelectasis is noted. 3. Status post coronary bypass graft. 4. 8 mm  ground-glass opacity is noted laterally in the left upper lobe. Initial follow-up by chest CT without contrast is recommended in 3 months to confirm persistence. This recommendation follows the consensus statement: Recommendations for the Management of Subsolid Pulmonary Nodules Detected at CT: A Statement from the [place] as published in Radiology 2013; 266:304-317. Aortic Atherosclerosis  (ICD10-I70.0). Electronically Signed   By: Marijo Conception M.D.   On: 01/02/2020 10:14

## 2020-01-02 NOTE — Progress Notes (Signed)
ANTICOAGULATION CONSULT NOTE - Follow Up Consult  Pharmacy Consult for Heparin Indication: chest pain/ACS  Allergies  Allergen Reactions  . Zocor [Simvastatin]     Made fingers numb    Patient Measurements: Height: 5\' 11"  (180.3 cm) Weight: 253 lb 12 oz (115.1 kg) IBW/kg (Calculated) : 75.3 Heparin Dosing Weight:  100.3 kg  Vital Signs: Temp: 98 F (36.7 C) (03/05 0850) Temp Source: Oral (03/05 0850) BP: 121/69 (03/05 0850) Pulse Rate: 85 (03/05 0850)  Labs: Recent Labs    01/02/20 0532 01/02/20 0727  HGB 12.4*  --   HCT 41.1  --   PLT 286  --   HEPARINUNFRC  --  <0.10*  CREATININE 1.12  --   TROPONINIHS 1,108* 993*    Estimated Creatinine Clearance: 75.8 mL/min (by C-G formula based on SCr of 1.12 mg/dL).   Assessment: CC/HPI: tx'd from [facility] for NSTEMI +COVID  PMH: dementia, hypertension, CAD, BPH, cataracts, CABG 05, HTN,   Significant events: ED at [facility] had a Covid negative test was diagnosed with pneumonia sent home on antibiotics. presented back to the ED with shortness of breath was found to have a positive troponin and also repeat Covid test was positive.   Anticoag: ACS. HL <0.1 due to low rate.  Goal of Therapy:  Heparin level 0.3-0.7 units/ml Monitor platelets by anticoagulation protocol: Yes   Plan:  - Bolus heparin 3000 units and increase infusion to 1500 units/hr - Recheck in 6-8 hrs. - Check CTA to rule out PE. - Daily HL and CBC    Leonard Garson S. Alford Highland, PharmD, BCPS Clinical Staff Pharmacist [email or web address] Alford Highland, Phil Corti Stillinger 01/02/2020,10:00 AM

## 2020-01-02 NOTE — Progress Notes (Signed)
ANTICOAGULATION CONSULT NOTE - Initial Consult  Pharmacy Consult for heparin Indication: chest pain/ACS  Allergies  Allergen Reactions  . Zocor [Simvastatin]     Made fingers numb    Patient Measurements: Height: 5\' 11"  (180.3 cm) Weight: 253 lb 12 oz (115.1 kg) IBW/kg (Calculated) : 75.3 Heparin Dosing Weight: 100kg  Vital Signs: Temp: 99.7 F (37.6 C) (03/05 0301) Temp Source: Oral (03/05 0301) BP: 105/62 (03/05 0300) Pulse Rate: 87 (03/05 0300)  Labs: No results for input(s): HGB, HCT, PLT, APTT, LABPROT, INR, HEPARINUNFRC, HEPRLOWMOCWT, CREATININE, CKTOTAL, CKMB, TROPONINIHS in the last 72 hours.  CrCl cannot be calculated (Patient's most recent lab result is older than the maximum 21 days allowed.).   Medical History: Past Medical History:  Diagnosis Date  . BPH (benign prostatic hyperplasia)   . Cataract 2016   Had Bil surgery  . Chest congestion 11/29/2018  . Coronary atherosclerosis of native coronary artery    Vessel CABG 2005  . Hyperlipidemia   . Hypertension   . Myocardial infarction [facility]) 1997   Had stent put in 197/CABG in2005  . Shoulder pain    right  side  . SOB (shortness of breath) on exertion     Medications:  Medications Prior to Admission  Medication Sig Dispense Refill Last Dose  . aspirin 81 MG tablet Take 81 mg by mouth daily.   01/01/2020  . atenolol (TENORMIN) 25 MG tablet TAKE 1 TABLET(25 MG) BY MOUTH DAILY 90 tablet 2 01/01/2020  . hydroxyurea (HYDREA) 500 MG capsule TK ONE C PO  QD   01/01/2020  . Omega-3 Fatty Acids (FISH OIL) 1000 MG CAPS Take 1,200 mg by mouth daily.    01/01/2020  . rosuvastatin (CRESTOR) 40 MG tablet TAKE 1 TABLET(40 MG) BY MOUTH DAILY 90 tablet 3 01/01/2020  . triamcinolone cream (KENALOG) 0.5 % Apply 1 application topically as needed.    01/01/2020   Scheduled:  . vitamin C  500 mg Oral Daily  . aspirin  81 mg Oral Daily  . dexamethasone (DECADRON) injection  6 mg Intravenous Q24H  . sodium chloride flush  3 mL  Intravenous Q12H  . zinc sulfate  220 mg Oral Daily   Infusions:  . sodium chloride    . remdesivir 100 mg in NS 100 mL      Assessment: 74yo male tx'd from [facility] for further w/u for ACS to continue heparin started at OSH; also found to be positive for Covid-19, started on remdesivir.  Pt was started on heparin 4000 units x1 followed by gtt at 1000 units/hr > PTT returned low at 29 > rebolused with 4000 units and gtt increased to 1200 units/hr.  Goal of Therapy:  Heparin level 0.3-0.7 units/ml Monitor platelets by anticoagulation protocol: Yes   Plan:  Will continue heparin gtt at 1200 units/hr and monitor heparin levels and CBC.  Of note pt rec'd remdesivir loading dose at [facility]; will start 100mg  Q24 x4 doses starting this afternoon.  Wynona Neat, PharmD, BCPS  01/02/2020,4:35 AM

## 2020-01-02 NOTE — Progress Notes (Signed)
   01/02/20 0900  Clinical Encounter Type  Visited With Health care provider  Visit Type Initial  Referral From Nurse  Consult/Referral To Chaplain   Chaplain is aware of the need for an advanced directive. Currently we are unable to logistically complete the AD on the covid units. Chaplain will be happy to assist with this process once pt is no longer in isolation for covid. Chaplain remains available for support as needs arise.   Chaplain Resident, Evelene Croon, M Div [phone] on-call pager

## 2020-01-02 NOTE — Plan of Care (Signed)
  Problem: Clinical Measurements: Goal: Respiratory complications will improve Outcome: Progressing   

## 2020-01-02 NOTE — Plan of Care (Signed)

## 2020-01-02 NOTE — Progress Notes (Signed)
Initial Nutrition Assessment   RD working remotely.  DOCUMENTATION CODES:   Obesity unspecified  INTERVENTION:  Provide Ensure Enlive po BID, each supplement provides 350 kcal and 20 grams of protein.  Provide 30 ml Prostat po once daily, each supplement provides 100 kcal and 15 grams of protein.   Encourage adequate PO intake.   NUTRITION DIAGNOSIS:   Increased nutrient needs related to acute illness(COVID) as evidenced by estimated needs.  GOAL:   Patient will meet greater than or equal to 90% of their needs  MONITOR:   PO intake, Supplement acceptance, Skin, Weight trends, Labs, I & O's  REASON FOR ASSESSMENT:   Malnutrition Screening Tool    ASSESSMENT:   74 y.o. male with PMHx of dementia, CAD s/p CABG, polycythemia rubra-who was transferred from [facility] to [facility] earlier this morning for evaluation of shortness of breath likely secondary to COVID-19 pneumonia.  While at [facility] complained of left-sided chest pain-and had elevated troponin levels of around 1.15.  RD unavailable during attempted time of contact. RD unable to obtain pt nutrition history. Per RN, pt with poor taste due to COVID. Pt with no significant weight loss per weight records. RD to order nutritional supplements to aid in caloric and protein needs.   Unable to complete Nutrition-Focused physical exam at this time.   Labs and medications reviewed.  Diet Order:   Diet Order            Diet Heart Room service appropriate? Yes; Fluid consistency: Thin  Diet effective now              EDUCATION NEEDS:   Not appropriate for education at this time  Skin:  Skin Assessment: Reviewed RN Assessment  Last BM:  3/4  Height:   Ht Readings from Last 1 Encounters:  01/02/20 5\' 11"  (1.803 m)    Weight:   Wt Readings from Last 1 Encounters:  01/02/20 115.1 kg    BMI:  Body mass index is 35.39 kg/m.  Estimated Nutritional Needs:   Kcal:  2100-2300  Protein:   105-115 grams  Fluid:  >/= 2 L/day   Corrin Parker, MS, RD, LDN RD pager number/after hours weekend pager number on Amion.

## 2020-01-02 NOTE — Consult Note (Addendum)
Cardiology Consultation:   Patient ID: Leonard Washington MRN: [id number]; DOB: 1946/09/29  Admit date: 01/02/2020 Date of Consult: 01/02/2020  Primary Care Provider: [name], MD Primary Cardiologist: Sherren Mocha, MD  Primary Electrophysiologist:  None    Patient Profile:   Leonard Washington is a 74 y.o. male with a hx of CAD S/P MI and stent 1997 and 4v CABG 2005, hypertension, hyperlipidemia who is being seen today for the evaluation of NSTEMI at the request of Dr. [name].  History of Present Illness:   Leonard Washington  is a 74 y.o. male with a hx of CAD S/P MI and stent 1997 and 4v CABG 2005, hypertension, hyperlipidemia, polycythemia rubra and obesity. He had 4 vessel CABG in 2005 with LIMA to LAD, left radial graft to RCA, SVG to ramus intermedius and SVG to left Circumflex marginal. He has done well since then with no recurrent ischemic events. He was last seen for follow up in the office by Dr. Burt Knack on 12/11/2019 at which time he was noted to have some vague exertional symptoms. A lexiscan myoview was ordered but was not yet done.   Last echocardiogram in 06/2016 showed EF 55-60%, possible hypokinesis of the basal-midinferior myocardium, grade 2 diastolic dysfunction.   The patient was transferred here from [facility] this morning for evaluation of shortness of breath likely secondary to COVID-19 pneumonia.  While at [facility] the patient complained of left-sided chest pain and had an elevated troponin of 1.15. He reportedly chest pain free here.   HS troponins: 1108>993 EKG: Sinus rhythm with 1st degree A-V block, 83 bpm, mild 0.5 mm j point elevations in leads V6, and inferior leads.  COVID 19 positive per H&P note Slightly elevated AST at 65 CRP 21.3 Hgb 12.4 WBCs 20.1 D-dimer was elevated at 3.87. Chest CTA was negative for evidence of PE.  Heart Pathway Score:     Past Medical History:  Diagnosis Date  . BPH (benign prostatic hyperplasia)   .  Cataract 2016   Had Bil surgery  . Chest congestion 11/29/2018  . Coronary atherosclerosis of native coronary artery    Vessel CABG 2005  . Hyperlipidemia   . Hypertension   . Myocardial infarction [facility]) 1997   Had stent put in 197/CABG in2005  . Shoulder pain    right  side  . SOB (shortness of breath) on exertion     Past Surgical History:  Procedure Laterality Date  . ARTERIAL DUPLEX  04/19/2004   Carotid-no evidence of significant plaque noted, no evidence of significant ICA stenosis, artery flow is antegrade. Upper extremities-within normal limits. Lower extremities-no evidence of lower extremity arterial occlusive disease at rest  . BLADDER SURGERY     had a growth in bladder/benign  . CARDIAC CATHETERIZATION  04/19/2004   severe four vessel coronary disease, continue vigorous medical therapy, recommendations for multivessel bypass grafting  . CARDIOVASCULAR STRESS TEST  07/01/2009   normal pattern of perfusion in all regions, no scintigraphic evidence of inducible myocardial ischemia,EKG negative for ischemia, no ECG changes,  . CATARACT EXTRACTION, BILATERAL    . CORONARY ARTERY BYPASS GRAFT  04/20/2004   x4. LIMA to LAD, left radial artery to RCA, SVG to ramus intermedius, SVG to circ.  Marland Kitchen DOPPLER ECHOCARDIOGRAPHY  11/11/2012   EF 55-60%, wall motion normal, no regional wall motion abnormalities, thickness of ventricular septum was mildly increased, mild regurg of the mitral valve     Home Medications:  Prior to Admission medications  Medication Sig Start Date End Date Taking? Authorizing Provider  aspirin 81 MG tablet Take 81 mg by mouth daily.   Yes [provider]  atenolol (TENORMIN) 25 MG tablet TAKE 1 TABLET(25 MG) BY MOUTH DAILY Patient taking differently: Take 25 mg by mouth daily.  01/02/19  Yes Sherren Mocha, MD  hydroxyurea (HYDREA) 500 MG capsule Take 500 mg by mouth daily.  01/03/19  Yes [provider]  Omega-3 Fatty Acids (FISH OIL) 1000 MG CAPS  Take 1,200 mg by mouth daily.    Yes [provider]  rosuvastatin (CRESTOR) 40 MG tablet TAKE 1 TABLET(40 MG) BY MOUTH DAILY Patient taking differently: Take 40 mg by mouth daily.  03/17/19  Yes Sherren Mocha, MD  triamcinolone cream (KENALOG) 0.5 % Apply 1 application topically as needed (rash).  03/10/19  Yes [provider]    Inpatient Medications: Scheduled Meds: . vitamin C  500 mg Oral Daily  . aspirin  81 mg Oral Daily  . atenolol  25 mg Oral Daily  . dexamethasone (DECADRON) injection  6 mg Intravenous Q24H  . feeding supplement (ENSURE ENLIVE)  237 mL Oral BID BM  . hydroxyurea  500 mg Oral Daily  . rosuvastatin  40 mg Oral q1800  . sodium chloride flush  3 mL Intravenous Q12H  . zinc sulfate  220 mg Oral Daily   Continuous Infusions: . sodium chloride    . heparin 1,500 Units/hr (01/02/20 1008)  . remdesivir 100 mg in NS 100 mL     PRN Meds: sodium chloride, sodium chloride flush  Allergies:    Allergies  Allergen Reactions  . Zocor [Simvastatin]     Made fingers numb    Social History:   Social History   Socioeconomic History  . Marital status: Married    Spouse name: Not on file  . Number of children: Not on file  . Years of education: Not on file  . Highest education level: Not on file  Occupational History  . Not on file  Tobacco Use  . Smoking status: Former Smoker    Quit date: 04/17/1979    Years since quitting: 40.7  . Smokeless tobacco: Never Used  Substance and Sexual Activity  . Alcohol use: Yes    Alcohol/week: 12.0 standard drinks    Types: 12 Cans of beer per week  . Drug use: No  . Sexual activity: Not on file  Other Topics Concern  . Not on file  Social History Narrative   The patient is married. He lives at [facility]. Retired since 2012. Remote smoker.   Social Determinants of Health   Financial Resource Strain:   . Difficulty of Paying Living Expenses: Not on file  Food Insecurity:   . Worried About  Charity fundraiser in the Last Year: Not on file  . Ran Out of Food in the Last Year: Not on file  Transportation Needs:   . Lack of Transportation (Medical): Not on file  . Lack of Transportation (Non-Medical): Not on file  Physical Activity:   . Days of Exercise per Week: Not on file  . Minutes of Exercise per Session: Not on file  Stress:   . Feeling of Stress : Not on file  Social Connections:   . Frequency of Communication with Friends and Family: Not on file  . Frequency of Social Gatherings with Friends and Family: Not on file  . Attends Religious Services: Not on file  . Active Member of Clubs  or Organizations: Not on file  . Attends Archivist Meetings: Not on file  . Marital Status: Not on file  Intimate Partner Violence:   . Fear of Current or Ex-Partner: Not on file  . Emotionally Abused: Not on file  . Physically Abused: Not on file  . Sexually Abused: Not on file    Family History:    Family History  Problem Relation Age of Onset  . Heart attack Maternal Grandfather   . Heart attack Maternal Uncle   . Colon cancer Maternal Uncle   . Heart attack Maternal Uncle   . Heart attack Maternal Uncle   . Heart disease Sister      ROS:  Please see the history of present illness.   All other ROS reviewed and negative.     Physical Exam/Data:   Vitals:   01/02/20 0400 01/02/20 0600 01/02/20 0850 01/02/20 1252  BP: 111/61 (!) 109/58 121/69 96/61  Pulse: 86 83 85 70  Resp: (!) 23 (!) 25 17 18   Temp:   98 F (36.7 C) 98 F (36.7 C)  TempSrc:   Oral Oral  SpO2: 93% 94% 95% 95%  Weight:      Height:        Intake/Output Summary (Last 24 hours) at 01/02/2020 1307 Last data filed at 01/02/2020 1200 Gross per 24 hour  Intake 123 ml  Output 600 ml  Net -477 ml   Last 3 Weights 01/02/2020 12/11/2019 03/11/2019  Weight (lbs) 253 lb 12 oz 253 lb 12.8 oz 246 lb 3.2 oz  Weight (kg) 115.1 kg 115.123 kg 111.676 kg     Body mass index is 35.39 kg/m.    Physical exam Per MD  EKG:  The EKG was personally reviewed and demonstrates:   Sinus rhythm with 1st degree A-V block, 83 bpm, mild 0.5 mm j point elevations in leads V6, and inferior leads, possibly early repolarization Telemetry:  Telemetry was personally reviewed and demonstrates:  NSR in the 70's-80's  Relevant CV Studies:  Echo pending  Echocardiogram 07/27/2016 Study Conclusions   - Procedure narrative: Transthoracic echocardiography. Image  quality was suboptimal. The study was technically difficult.  Intravenous contrast (Definity) was administered.  - Left ventricle: The cavity size was normal. Wall thickness was  normal. Systolic function was normal. The estimated ejection  fraction was in the range of 55% to 60%. Possible hypokinesis of  the basal-midinferior myocardium. Features are consistent with a  pseudonormal left ventricular filling pattern, with concomitant  abnormal relaxation and increased filling pressure (grade 2  diastolic dysfunction).  - Aortic valve: Trileaflet; moderately thickened, mildly calcified  leaflets.  - Left atrium: The atrium was mildly dilated.  - Right ventricle: The cavity size was mildly dilated. Wall  thickness was normal.  - Tricuspid valve: There was trivial regurgitation.   Laboratory Data:  High Sensitivity Troponin:   Recent Labs  Lab 01/02/20 0532 01/02/20 0727  TROPONINIHS 1,108* 993*     Chemistry Recent Labs  Lab 01/02/20 0532  NA 136  K 3.9  CL 99  CO2 27  GLUCOSE 122*  BUN 13  CREATININE 1.12  CALCIUM 8.2*  GFRNONAA >60  GFRAA >60  ANIONGAP 10    Recent Labs  Lab 01/02/20 0532  PROT 5.5*  ALBUMIN 2.0*  AST 65*  ALT 37  ALKPHOS 72  BILITOT 1.0   Hematology Recent Labs  Lab 01/02/20 0532  WBC 20.1*  RBC 5.23  HGB 12.4*  HCT 41.1  MCV  78.6*  MCH 23.7*  MCHC 30.2  RDW 19.1*  PLT 286   BNPNo results for input(s): BNP, PROBNP in the last 168 hours.  DDimer  Recent  Labs  Lab 01/02/20 0532  DDIMER 3.87*     Radiology/Studies:  CT ANGIO CHEST PE W OR WO CONTRAST  Result Date: 01/02/2020 CLINICAL DATA:  Shortness of breath, elevated D-dimer level. EXAM: CT ANGIOGRAPHY CHEST WITH CONTRAST TECHNIQUE: Multidetector CT imaging of the chest was performed using the standard protocol during bolus administration of intravenous contrast. Multiplanar CT image reconstructions and MIPs were obtained to evaluate the vascular anatomy. CONTRAST:  60 mL OMNIPAQUE IOHEXOL 350 MG/ML SOLN COMPARISON:  December 29, 2019. FINDINGS: Cardiovascular: Satisfactory opacification of the pulmonary arteries to the segmental level. No evidence of pulmonary embolism. Normal heart size. No pericardial effusion. Atherosclerosis of thoracic aorta is noted without aneurysm or dissection. Status post coronary bypass graft. Mediastinum/Nodes: No enlarged mediastinal, hilar, or axillary lymph nodes. Thyroid gland, trachea, and esophagus demonstrate no significant findings. Lungs/Pleura: No pneumothorax or pleural effusion is noted. Mild bibasilar subsegmental atelectasis is noted. 8 mm ground-glass opacity is noted laterally in the left upper lobe best seen on image number 28 of series 7 which was present on prior exam. Upper Abdomen: No acute abnormality. Musculoskeletal: No chest wall abnormality. No acute or significant osseous findings. Review of the MIP images confirms the above findings. IMPRESSION: 1. No definite evidence of pulmonary embolus. 2. Mild bibasilar subsegmental atelectasis is noted. 3. Status post coronary bypass graft. 4. 8 mm ground-glass opacity is noted laterally in the left upper lobe. Initial follow-up by chest CT without contrast is recommended in 3 months to confirm persistence. This recommendation follows the consensus statement: Recommendations for the Management of Subsolid Pulmonary Nodules Detected at CT: A Statement from the [place] as published in Radiology 2013;  266:304-317. Aortic Atherosclerosis (ICD10-I70.0). Electronically Signed   By: Marijo Conception M.D.   On: 01/02/2020 10:14         Assessment and Plan:   Elevated troponins in setting of COVID 19 infection -Pt presented with complaints of shortness of breath and found to be COVID-19 positive, diagnosed with pneumonia. -Troponin I was 1.15 at [facility]  Here his high sensitivity troponins: [id number].  Troponins are mildly elevated in a relatively flat pattern, not consistent with ACS.  This elevation could represent demand ischemia in the setting of COVID-19 pneumonia, which could be more prominent in light of his known CAD with remote hx of CABG. -CRP is elevated.  D-dimer was elevated, however CTA of the chest was negative for PE. -He is currently on a heparin drip. -Echocardiogram is pending. Assess for wall motion abnormalities and LV function. -We would like to take pt for cardiac cath to evaluate his coronary arteries and grafts that are 74 years old although this is not urgent. Since he has active COVID 19 and troponins only mildly elevated with no rise, no current chest pain and no significant acute ischemic changes on EKG, we will defer cath until he recovers from [place]. He can see Dr. Burt Knack in the office 2 weeks after his symptoms are resolved for follow up. -We will also check 1 more troponin tomorrow morning for trend.  -We will continue to follow.   CAD status post CABG x4 in 2005 -Medical therapy includes aspirin 81 mg, atenolol 25 mg and high intensity statin. -Patient has been stable since his CABG with no subsequent cardiac events.  He did have  some mild exertional dyspnea recently and was planned for an outpatient Myoview but this has not yet been done.  Covid 19 infection -Patient with risk factors for severe disease.  He continues on steroids and remdesivir per primary team.  CRP remains elevated.  Hypertension -His home atenolol is continued. -Blood pressure is  well controlled, actually a little soft today and 96/61.  Continue to monitor.  Hyperlipidemia  -On rosuvastatin 40 mg daily and fish oil. -LDL is 57, at goal of <70. Continue current therapy.   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   For questions or updates, please contact [place] Please consult [email or web address] for contact info under   Signed, Daune Perch, NP  01/02/2020 1:07 PM  Patient seen, examined. Available data reviewed. Agree with findings, assessment, and plan as outlined by Pecolia Ades, NP.  The patient is independently interviewed and examined.  On my examination today, he is alert, oriented, in no distress.  Resting comfortably in bed.  HEENT is normal, carotid upstrokes normal without bruits, JVP is normal, lung fields are clear bilaterally, heart is regular rate and rhythm with no murmur gallops, abdomen is soft and nontender, extremities have no peripheral edema, skin is warm and dry with scattered ecchymoses but no rashes present. EKG demonstrates sinus rhythm with mild inferolateral ST segment elevation and no reciprocal changes.  Echocardiogram is reviewed and demonstrates normal LV systolic function with LVEF 65% and no evidence of pericardial effusion.  Laboratory data is significant for a high-sensitivity troponin of 1108 and a second high-sensitivity troponin of 993.  The patient is now chest pain-free.  He is treated with IV heparin.  His chest pain symptoms were fairly atypical and they occurred at the time of his initial presentation.  His pattern of ST segment elevation raises the possibility of myopericarditis.  He does not appear to have any regional wall motion abnormalities that would be suggestive of acute coronary occlusion.  I had previously schedule him for a Lexiscan Myoview stress test.  I do not think he requires any further cardiac testing during his hospitalization since he is currently asymptomatic from a cardiac perspective.  I would recommend  continuing IV heparin another 24 hours and then converting him to the subcu heparin regimen.  We will add clopidogrel for medical therapy since there is some uncertainty whether this could be a coronary event or myopericardial in nature.  I will plan to see him in close outpatient follow-up with a telehealth visit.  At that time, depending on his clinical course, I will consider either diagnostic cardiac catheterization or pharmacologic stress testing as previously planned.  Sherren Mocha, M.D. 01/02/2020 4:57 PM

## 2020-01-02 NOTE — Progress Notes (Signed)
  Echocardiogram 2D Echocardiogram has been performed.  Leonard Washington G Leonard Washington 01/02/2020, 11:05 AM

## 2020-01-02 NOTE — H&P (Signed)
History and Physical    Leonard Washington [id number] DOB: 03-15-1946 DOA: 01/02/2020  PCP: [name], MD  Patient coming from: Home  Chief Complaint: Shortness of breath  HPI: Leonard Washington is a 74 y.o. male with medical history significant of dementia, hypertension, coronary artery disease status post CABG comes in with a week and a half of progressive worsening shortness of breath and cough.  Patient is been seen in the ED at [facility] had a Covid negative test was diagnosed with pneumonia sent home on antibiotics.  He progressively got worse with more shortness of breath presented back to the ED with shortness of breath was found to have a positive troponin and also repeat Covid test was positive.  Patient states that he actually has been feeling better the last day or so.  He has not been on any steroids or been treated for Covid.  He denies any chest pain.  Patient be referred for admission for NSTEMI.  Troponin was 1.15.  Review of Systems: As per HPI otherwise 10 point review of systems negative.   Past Medical History:  Diagnosis Date  . BPH (benign prostatic hyperplasia)   . Cataract 2016   Had Bil surgery  . Chest congestion 11/29/2018  . Coronary atherosclerosis of native coronary artery    Vessel CABG 2005  . Hyperlipidemia   . Hypertension   . Myocardial infarction [facility]) 1997   Had stent put in 197/CABG in2005  . Shoulder pain    right  side  . SOB (shortness of breath) on exertion     Past Surgical History:  Procedure Laterality Date  . ARTERIAL DUPLEX  04/19/2004   Carotid-no evidence of significant plaque noted, no evidence of significant ICA stenosis, artery flow is antegrade. Upper extremities-within normal limits. Lower extremities-no evidence of lower extremity arterial occlusive disease at rest  . BLADDER SURGERY     had a growth in bladder/benign  . CARDIAC CATHETERIZATION  04/19/2004   severe four vessel coronary disease, continue vigorous  medical therapy, recommendations for multivessel bypass grafting  . CARDIOVASCULAR STRESS TEST  07/01/2009   normal pattern of perfusion in all regions, no scintigraphic evidence of inducible myocardial ischemia,EKG negative for ischemia, no ECG changes,  . CATARACT EXTRACTION, BILATERAL    . CORONARY ARTERY BYPASS GRAFT  04/20/2004   x4. LIMA to LAD, left radial artery to RCA, SVG to ramus intermedius, SVG to circ.  Marland Kitchen DOPPLER ECHOCARDIOGRAPHY  11/11/2012   EF 55-60%, wall motion normal, no regional wall motion abnormalities, thickness of ventricular septum was mildly increased, mild regurg of the mitral valve     reports that he quit smoking about 40 years ago. He has never used smokeless tobacco. He reports current alcohol use of about 12.0 standard drinks of alcohol per week. He reports that he does not use drugs.  Allergies  Allergen Reactions  . Zocor [Simvastatin]     Made fingers numb    Family History  Problem Relation Age of Onset  . Heart attack Maternal Grandfather   . Heart attack Maternal Uncle   . Colon cancer Maternal Uncle   . Heart attack Maternal Uncle   . Heart attack Maternal Uncle   . Heart disease Sister     Prior to Admission medications   Medication Sig Start Date End Date Taking? Authorizing Provider  aspirin 81 MG tablet Take 81 mg by mouth daily.   Yes [provider]  atenolol (TENORMIN) 25 MG tablet TAKE 1 TABLET(25  MG) BY MOUTH DAILY 01/02/19  Yes Sherren Mocha, MD  hydroxyurea (HYDREA) 500 MG capsule TK ONE C PO  QD 01/03/19  Yes [provider]  Omega-3 Fatty Acids (FISH OIL) 1000 MG CAPS Take 1,200 mg by mouth daily.    Yes [provider]  rosuvastatin (CRESTOR) 40 MG tablet TAKE 1 TABLET(40 MG) BY MOUTH DAILY 03/17/19  Yes Sherren Mocha, MD  triamcinolone cream (KENALOG) 0.5 % Apply 1 application topically as needed.  03/10/19  Yes [provider]    Physical Exam: Vitals:   01/02/20 0236 01/02/20 0300 01/02/20  0301  BP:  105/62   Pulse:  87   Resp:  (!) 24   Temp:   99.7 F (37.6 C)  TempSrc:   Oral  SpO2:  91%   Weight: 115.1 kg    Height: 5\' 11"  (1.803 m)        Constitutional: NAD, calm, comfortable Vitals:   01/02/20 0236 01/02/20 0300 01/02/20 0301  BP:  105/62   Pulse:  87   Resp:  (!) 24   Temp:   99.7 F (37.6 C)  TempSrc:   Oral  SpO2:  91%   Weight: 115.1 kg    Height: 5\' 11"  (1.803 m)     Eyes: PERRL, lids and conjunctivae normal ENMT: Mucous membranes are moist. Posterior pharynx clear of any exudate or lesions.Normal dentition.  Neck: normal, supple, no masses, no thyromegaly Respiratory: clear to auscultation bilaterally, no wheezing, no crackles. Normal respiratory effort. No accessory muscle use.  Cardiovascular: Regular rate and rhythm, no murmurs / rubs / gallops. No extremity edema. 2+ pedal pulses. No carotid bruits.  Abdomen: no tenderness, no masses palpated. No hepatosplenomegaly. Bowel sounds positive.  Musculoskeletal: no clubbing / cyanosis. No joint deformity upper and lower extremities. Good ROM, no contractures. Normal muscle tone.  Skin: no rashes, lesions, ulcers. No induration Neurologic: CN 2-12 grossly intact. Sensation intact, DTR normal. Strength 5/5 in all 4.  Psychiatric: Normal judgment and insight. Alert and oriented x 3. Normal mood.    Labs on Admission: I have personally reviewed following labs and imaging studies  CBC: No results for input(s): WBC, NEUTROABS, HGB, HCT, MCV, PLT in the last 168 hours. Basic Metabolic Panel: No results for input(s): NA, K, CL, CO2, GLUCOSE, BUN, CREATININE, CALCIUM, MG, PHOS in the last 168 hours. GFR: CrCl cannot be calculated (Patient's most recent lab result is older than the maximum 21 days allowed.). Liver Function Tests: No results for input(s): AST, ALT, ALKPHOS, BILITOT, PROT, ALBUMIN in the last 168 hours. No results for input(s): LIPASE, AMYLASE in the last 168 hours. No results for  input(s): AMMONIA in the last 168 hours. Coagulation Profile: No results for input(s): INR, PROTIME in the last 168 hours. Cardiac Enzymes: No results for input(s): CKTOTAL, CKMB, CKMBINDEX, TROPONINI in the last 168 hours. BNP (last 3 results) No results for input(s): PROBNP in the last 8760 hours. HbA1C: No results for input(s): HGBA1C in the last 72 hours. CBG: No results for input(s): GLUCAP in the last 168 hours. Lipid Profile: No results for input(s): CHOL, HDL, LDLCALC, TRIG, CHOLHDL, LDLDIRECT in the last 72 hours. Thyroid Function Tests: No results for input(s): TSH, T4TOTAL, FREET4, T3FREE, THYROIDAB in the last 72 hours. Anemia Panel: No results for input(s): VITAMINB12, FOLATE, FERRITIN, TIBC, IRON, RETICCTPCT in the last 72 hours. Urine analysis: No results found for: COLORURINE, APPEARANCEUR, LABSPEC, PHURINE, GLUCOSEU, HGBUR, BILIRUBINUR, KETONESUR, PROTEINUR, UROBILINOGEN, NITRITE, LEUKOCYTESUR Sepsis Labs: !!!!!!!!!!!!!!!!!!!!!!!!!!!!!!!!!!!!!!!!!!!! @LABRCNTIP (procalcitonin:4,lacticidven:4) )  No results found for this or any previous visit (from the past 240 hour(s)).   Radiological Exams on Admission: No results found.  Old chart reviewed  Assessment/Plan 74 year old male with Covid pneumonia and positive troponin Principal Problem:   Pneumonia due to COVID-19 virus-placed on remdesivir and Decadron.  O2 sats 90% on room air.  Check D-dimer, CRP, ferritin, pro-Cal vital signs stable at this time.  Check CTA to rule out PE.  Active Problems:   NSTEMI (non-ST elevated myocardial infarction) (HCC)-continue to serial troponin.  Check EKG.  Also check cardiac echo.  Continue heparin drip.    CAD (coronary artery disease)-as above     DVT prophylaxis: Heparin drip Code Status: Full Family Communication: None Disposition Plan: 1 to 2 days Consults called: None Admission status: Admission   Wynette Jersey A MD [facility]  If 7PM-7AM, please contact  night-coverage [email or web address] Password [facility]  01/02/2020, 4:33 AM

## 2020-01-02 NOTE — Progress Notes (Addendum)
ANTICOAGULATION CONSULT NOTE - Follow Up Consult  Pharmacy Consult for Heparin Indication: chest pain/ACS  Allergies  Allergen Reactions  . Zocor [Simvastatin]     Made fingers numb    Patient Measurements: Height: 5\' 11"  (180.3 cm) Weight: 253 lb 12 oz (115.1 kg) IBW/kg (Calculated) : 75.3 Heparin Dosing Weight:  100.3 kg  Vital Signs: Temp: 97.8 F (36.6 C) (03/05 1551) Temp Source: Oral (03/05 1551) BP: 106/60 (03/05 1551) Pulse Rate: 72 (03/05 1551)  Labs: Recent Labs    01/02/20 0532 01/02/20 0727 01/02/20 1800  HGB 12.4*  --   --   HCT 41.1  --   --   PLT 286  --   --   HEPARINUNFRC  --  <0.10* 0.11*  CREATININE 1.12  --   --   TROPONINIHS 1,108* 993*  --     Estimated Creatinine Clearance: 75.8 mL/min (by C-G formula based on SCr of 1.12 mg/dL).   Assessment: 74 yr old male with hx CAD (S/P MI and stent in 1997, 4-vessel CABG in 2005) was transferred from [facility] with elevated troponins in the setting of COVID pneumonia. Chest CTA was negative for evidence of PE. When pt recovers from [place], Cardiology plans cardiac cath to evaluate coronary arteries and grafts that are 74 yrs old.  Heparin level ~ 8 hrs after heparin 3000 units IV bolus, followed by increasing heparin infusion to 1500 units/hr, was 0.11 units/ml, which is below the goal range for this pt. H/H 12.4/41.1 (down), platelets 286. Per RN, pt's heparin IV line was kinked for several minutes; the issue with IV has since resolved, but may have affected heparin level. Per RN, pt has no bleeding except when IV site was removed.  Due to issue with IV which may have impacted heparin level, heparin level was repeated and resulted at 0.15 units/ml, which is still below the goal range for this pt.  Goal of Therapy:  Heparin level 0.3-0.7 units/ml Monitor platelets by anticoagulation protocol: Yes   Plan:  Heparin 2250 units IV bolus X 1 Increase heparin infusion to 1800 units/hr Check heparin  level in 8 hrs Monitor daily heparin level, CBC Monitor for signs/symptoms of bleeding  Gillermina Hu, PharmD, BCPS, [facility] Clinical Pharmacist 01/02/2020,6:43 PM

## 2020-01-03 DIAGNOSIS — U071 COVID-19: Principal | ICD-10-CM

## 2020-01-03 DIAGNOSIS — J1282 Pneumonia due to coronavirus disease 2019: Secondary | ICD-10-CM

## 2020-01-03 LAB — TROPONIN I (HIGH SENSITIVITY): Troponin I (High Sensitivity): 282 ng/L (ref <18)

## 2020-01-03 LAB — CBC
HCT: 45.9 % (ref 39.0–52.0)
Hemoglobin: 13.9 g/dL (ref 13.0–17.0)
MCH: 23.7 pg — ABNORMAL LOW (ref 26.0–34.0)
MCHC: 30.3 g/dL (ref 30.0–36.0)
MCV: 78.3 fL — ABNORMAL LOW (ref 80.0–100.0)
Platelets: 366 10*3/uL (ref 150–400)
RBC: 5.86 MIL/uL — ABNORMAL HIGH (ref 4.22–5.81)
RDW: 19.7 % — ABNORMAL HIGH (ref 11.5–15.5)
WBC: 18.2 10*3/uL — ABNORMAL HIGH (ref 4.0–10.5)
nRBC: 0 % (ref 0.0–0.2)

## 2020-01-03 LAB — COMPREHENSIVE METABOLIC PANEL
ALT: 34 U/L (ref 0–44)
AST: 61 U/L — ABNORMAL HIGH (ref 15–41)
Albumin: 1.9 g/dL — ABNORMAL LOW (ref 3.5–5.0)
Alkaline Phosphatase: 75 U/L (ref 38–126)
Anion gap: 12 (ref 5–15)
BUN: 20 mg/dL (ref 8–23)
CO2: 22 mmol/L (ref 22–32)
Calcium: 8.2 mg/dL — ABNORMAL LOW (ref 8.9–10.3)
Chloride: 101 mmol/L (ref 98–111)
Creatinine, Ser: 0.79 mg/dL (ref 0.61–1.24)
GFR calc Af Amer: >60 mL/min (ref >60)
GFR calc non Af Amer: >60 mL/min (ref >60)
Glucose, Bld: 128 mg/dL — ABNORMAL HIGH (ref 70–99)
Potassium: 3.8 mmol/L (ref 3.5–5.1)
Sodium: 135 mmol/L (ref 135–145)
Total Bilirubin: 1.2 mg/dL (ref 0.3–1.2)
Total Protein: 5.6 g/dL — ABNORMAL LOW (ref 6.5–8.1)

## 2020-01-03 LAB — FERRITIN: Ferritin: 171 ng/mL (ref 24–336)

## 2020-01-03 LAB — HEPARIN LEVEL (UNFRACTIONATED): Heparin Unfractionated: 0.16 IU/mL — ABNORMAL LOW (ref 0.30–0.70)

## 2020-01-03 LAB — C-REACTIVE PROTEIN: CRP: 19.1 mg/dL — ABNORMAL HIGH (ref <1.0)

## 2020-01-03 LAB — D-DIMER, QUANTITATIVE: D-Dimer, Quant: 5.88 ug/mL-FEU — ABNORMAL HIGH (ref 0.00–0.50)

## 2020-01-03 MED ORDER — SODIUM CHLORIDE 0.9 % IV BOLUS
500.0000 mL | Freq: Once | INTRAVENOUS | Status: AC
  Administered 2020-01-03: 500 mL via INTRAVENOUS

## 2020-01-03 MED ORDER — TRAZODONE HCL 50 MG PO TABS
50.0000 mg | ORAL_TABLET | Freq: Once | ORAL | Status: AC
  Administered 2020-01-03: 50 mg via ORAL
  Filled 2020-01-03: qty 1

## 2020-01-03 MED ORDER — METOPROLOL TARTRATE 25 MG PO TABS
25.0000 mg | ORAL_TABLET | Freq: Three times a day (TID) | ORAL | Status: DC
  Administered 2020-01-03 – 2020-01-05 (×6): 25 mg via ORAL
  Filled 2020-01-03 (×7): qty 1

## 2020-01-03 MED ORDER — DILTIAZEM HCL 25 MG/5ML IV SOLN
10.0000 mg | Freq: Once | INTRAVENOUS | Status: AC
  Administered 2020-01-03: 10 mg via INTRAVENOUS
  Filled 2020-01-03: qty 5

## 2020-01-03 MED ORDER — CLOPIDOGREL BISULFATE 75 MG PO TABS: 75.0000 mg | ORAL_TABLET | Freq: Every day | ORAL | Status: DC

## 2020-01-03 MED ORDER — HEPARIN BOLUS VIA INFUSION
3000.0000 [IU] | Freq: Once | INTRAVENOUS | Status: AC
  Administered 2020-01-03: 3000 [IU] via INTRAVENOUS
  Filled 2020-01-03: qty 3000

## 2020-01-03 MED ORDER — METOPROLOL TARTRATE 12.5 MG HALF TABLET
12.5000 mg | ORAL_TABLET | Freq: Once | ORAL | Status: AC
  Administered 2020-01-03: 12.5 mg via ORAL

## 2020-01-03 MED ORDER — APIXABAN 5 MG PO TABS
5.0000 mg | ORAL_TABLET | Freq: Two times a day (BID) | ORAL | Status: DC
  Administered 2020-01-03 – 2020-01-07 (×9): 5 mg via ORAL
  Filled 2020-01-03 (×9): qty 1

## 2020-01-03 NOTE — Progress Notes (Signed)
ANTICOAGULATION CONSULT NOTE - Initial Consult  Pharmacy Consult for Heparin Transition to Apixaban  Indication: chest pain/ACS now in Afib   Allergies  Allergen Reactions  . Zocor [Simvastatin]     Made fingers numb    Patient Measurements: Height: 5\' 11"  (180.3 cm) Weight: 253 lb 12 oz (115.1 kg) IBW/kg (Calculated) : 75.3 Heparin Dosing Weight:  100.3 kg  Vital Signs: Temp: 97.7 F (36.5 C) (03/06 1200) Temp Source: Oral (03/06 1200) BP: 108/65 (03/06 1202) Pulse Rate: 112 (03/06 1200)  Labs: Recent Labs    01/02/20 0532 01/02/20 0727 01/02/20 0727 01/02/20 1800 01/02/20 1900 01/03/20 0359 01/03/20 0447  HGB 12.4*  --   --   --   --  13.9  --   HCT 41.1  --   --   --   --  45.9  --   PLT 286  --   --   --   --  366  --   HEPARINUNFRC  --  <0.10*   < > 0.11* 0.15* 0.16*  --   CREATININE 1.12  --   --   --   --  0.79  --   TROPONINIHS 1,108* 993*  --   --   --   --  282*   < > = values in this interval not displayed.    Estimated Creatinine Clearance: 106.1 mL/min (by C-G formula based on SCr of 0.79 mg/dL).   Assessment: 74 yr old male with hx CAD (S/P MI and stent in 1997, 4-vessel CABG in 2005) was transferred from [facility] with elevated troponins in the setting of COVID pneumonia. Chest CTA was negative for evidence of PE. When pt recovers from [place], Cardiology plans cardiac cath to evaluate coronary arteries and grafts that are 74 yrs old.  Patient started on heparin for ACS now found to have Afib. Pharmacy consulted to transition from heparin to apixaban for Afib. Patient currently on heparin 2100 units/hr. CBC stable. No reported bleeding. Age < 80 years, weight >60k, and Scr <1.5. Patient also on metoprolol for rate control.   Goal of Therapy:  Monitor platelets by anticoagulation protocol: Yes   Plan:  Discontinue heparin infusion at the same time administering the first dose of apixaban - RN aware Start apixaban 5mg  twice daily  Monitor  S/S of bleeding  Pharmacy to provide education   Cristela Felt, PharmD PGY1 Pharmacy Resident [employer]: [phone]  01/03/2020,12:13 PM

## 2020-01-03 NOTE — Progress Notes (Signed)
   Vital Signs MEWS/VS Documentation      01/03/2020 1000 01/03/2020 1200 01/03/2020 1202 01/03/2020 1522   MEWS Score:  2  3  2  2    MEWS Score Color:  Yellow  Yellow  Yellow  Yellow   Resp:  -  (!) 22  (!) 23  (!) 22   Pulse:  -  (!) 112  -  85   BP:  107/83  92/65  108/65  115/64   Temp:  -  97.7 F (36.5 C)  -  -   O2 Device:  -  Room Air  -  Room Air   Level of Consciousness:  -  Alert  -  Alert     Pt MEWS score was 4, due Afib with RVR, HR >140, MD. [name] was notify and patient received metoprolol tolerated well, no complaints of SOB, chest pain or discomfort. Pt stable will continue to monitor.     Leonard Washington 01/03/2020,6:56 PM

## 2020-01-03 NOTE — Progress Notes (Signed)
PROGRESS NOTE                                                                                                                                                                                                             Patient Demographics:    Leonard Washington, is a 74 y.o. male, DOB - 13-Mar-1946, [id number]  Outpatient Primary MD for the patient is [name], MD   Admit date - 01/02/2020   LOS - 1  No chief complaint on file.      Brief Narrative: Patient is a 74 y.o. male with PMHx of CAD s/p CABG, polycythemia rubra-who was transferred from [facility] to [facility] earlier this morning for evaluation of shortness of breath likely secondary to COVID-19 pneumonia.  While at [facility] complained of left-sided chest pain-and had elevated troponin levels of around 1.15.  Significant Events: 3/6>> A. fib with RVR 3/5>> transfer from [facility]/admit to [facility] 3/5>> CTA chest-negative for PE  COVID-19 medications: Remdesivir: 3/4>> Steroids: 3/4>>  Microbiology data: None  Procedures: None  Consults: Cardiology   Subjective:   No chest pain or shortness of breath-lying comfortably in bed.   Assessment  & Plan :   Covid 19 Viral pneumonia: Remains stable on room air-CRP still significantly elevated-but has started to trend down.  Continue steroids/remdesivir.  Follow clinical trajectory and inflammatory markers.    Note-CTA chest on 3/5 - for PE.  Fever: afebrile  O2 requirements:  SpO2: 93 %   COVID-19 Labs: Recent Labs    01/02/20 0532 01/03/20 0359  DDIMER 3.87* 5.88*  FERRITIN 194 171  LDH 265*  --   CRP 21.3* 19.1*    No results found for: BNP  Recent Labs  Lab 01/02/20 0532  PROCALCITON 0.34    No results found for: SARSCOV2NAA   Prone/Incentive Spirometry: encouraged  incentive spirometry use 3-4/hour.  DVT Prophylaxis  :   Heparin infusion  Elevated troponin/chest pain:  Thought to be atypical-appreciate cardiology input.  Plans are for further work-up in the outpatient setting.   History of CAD s/p CABG: On beta-blocker and statin.  Due to A. fib-no longer on aspirin.  Paroxysmal A. fib with RVR: Developed A. fib overnight-stop atenolol-and transition to metoprolol 25 mg 3 times daily.  Cardiology has started Eliquis-no longer on IV heparin.  Will defer further to cardiology.  Leukocytosis: Could be secondary to steroids-follow for now.  HTN: BP on the higher side-resume atenolol  HLD: Resume statin  Polycythemia rubra: Resume hydroxyurea-follow hemoglobin/hematocrit.  8 mm ground-glass opacity is noted laterally in the left upper lobe: Incidental finding on CT chest-ex-smoker-defer to outpatient MD-but repeat CT chest in 3 to 6 months.  Obesity: Estimated body mass index is 35.39 kg/m as calculated from the following:   Height as of this encounter: 5\' 11"  (1.803 m).   Weight as of this encounter: 115.1 kg.    ABG: No results found for: PHART, PCO2ART, PO2ART, HCO3, TCO2, ACIDBASEDEF, O2SAT  Vent Settings: N/A  Condition -Guarded  Family Communication  :  Spouse updated over the phone on 3/6  Code Status :  Full Code  Diet :  Diet Order            Diet Heart Room service appropriate? Yes; Fluid consistency: Thin  Diet effective now               Disposition Plan  :  Remain hospitalized-Home when medically stable  Barriers to discharge: Complete 5 days of IV Remdesivir, A. fib with RVR  Antimicorbials  :    Anti-infectives (From admission, onward)   Start     Dose/Rate Route Frequency Ordered Stop   01/03/20 1000  remdesivir 100 mg in sodium chloride 0.9 % 100 mL IVPB  Status:  Discontinued     100 mg 200 mL/hr over 30 Minutes Intravenous Daily 01/02/20 0430 01/02/20 0432   01/02/20 1000  remdesivir 100 mg in sodium chloride 0.9 % 100 mL IVPB     100 mg 200 mL/hr over 30 Minutes Intravenous Daily 01/02/20 0434 01/06/20 0959    01/02/20 0430  remdesivir 200 mg in sodium chloride 0.9% 250 mL IVPB  Status:  Discontinued     200 mg 580 mL/hr over 30 Minutes Intravenous Once 01/02/20 0430 01/02/20 0432      Inpatient Medications  Scheduled Meds: . vitamin C  500 mg Oral Daily  . dexamethasone (DECADRON) injection  6 mg Intravenous Q24H  . feeding supplement (ENSURE ENLIVE)  237 mL Oral BID BM  . feeding supplement (PRO-STAT SUGAR FREE 64)  30 mL Oral Daily  . hydroxyurea  500 mg Oral Daily  . metoprolol tartrate  25 mg Oral TID  . rosuvastatin  40 mg Oral q1800  . sodium chloride flush  3 mL Intravenous Q12H  . zinc sulfate  220 mg Oral Daily   Continuous Infusions: . sodium chloride    . remdesivir 100 mg in NS 100 mL 100 mg (01/03/20 1004)   PRN Meds:.sodium chloride, sodium chloride flush   Time Spent in minutes  35   See all Orders from today for further details   Oren Binet M.D on 01/03/2020 at 12:11 PM  To page go to [email or web address] - use universal password  [facility] -  Office  [phone]    Objective:   Vitals:   01/03/20 0956 01/03/20 1000 01/03/20 1200 01/03/20 1202  BP: 107/83 107/83 92/65 108/65  Pulse: (!) 125  (!) 112   Resp:   (!) 22 (!) 23  Temp:   97.7 F (36.5 C)   TempSrc:   Oral   SpO2:   93%   Weight:      Height:        Wt Readings from Last 3 Encounters:  01/02/20 115.1 kg  12/11/19 115.1 kg  03/11/19 111.7 kg     Intake/Output  Summary (Last 24 hours) at 01/03/2020 1211 Last data filed at 01/03/2020 1204 Gross per 24 hour  Intake 1994.2 ml  Output 1675 ml  Net 319.2 ml     Physical Exam Gen Exam:Alert awake-not in any distress HEENT:atraumatic, normocephalic Chest: B/L clear to auscultation anteriorly CVS:S1S2 irregular Abdomen:soft non tender, non distended Extremities:no edema Neurology: Non focal Skin: no rash   Data Review:    CBC Recent Labs  Lab 01/02/20 0532 01/03/20 0359  WBC 20.1* 18.2*  HGB 12.4* 13.9  HCT 41.1 45.9    PLT 286 366  MCV 78.6* 78.3*  MCH 23.7* 23.7*  MCHC 30.2 30.3  RDW 19.1* 19.7*  LYMPHSABS 0.8  --   MONOABS 1.4*  --   EOSABS 0.0  --   BASOSABS 0.1  --     Chemistries  Recent Labs  Lab 01/02/20 0532 01/03/20 0359  NA 136 135  K 3.9 3.8  CL 99 101  CO2 27 22  GLUCOSE 122* 128*  BUN 13 20  CREATININE 1.12 0.79  CALCIUM 8.2* 8.2*  AST 65* 61*  ALT 37 34  ALKPHOS 72 75  BILITOT 1.0 1.2   ------------------------------------------------------------------------------------------------------------------ No results for input(s): CHOL, HDL, LDLCALC, TRIG, CHOLHDL, LDLDIRECT in the last 72 hours.  Lab Results  Component Value Date   HGBA1C 5.0 07/13/2016   ------------------------------------------------------------------------------------------------------------------ No results for input(s): TSH, T4TOTAL, T3FREE, THYROIDAB in the last 72 hours.  Invalid input(s): FREET3 ------------------------------------------------------------------------------------------------------------------ Recent Labs    01/02/20 0532 01/03/20 0359  FERRITIN 194 171    Coagulation profile No results for input(s): INR, PROTIME in the last 168 hours.  Recent Labs    01/02/20 0532 01/03/20 0359  DDIMER 3.87* 5.88*    Cardiac Enzymes No results for input(s): CKMB, TROPONINI, MYOGLOBIN in the last 168 hours.  Invalid input(s): CK ------------------------------------------------------------------------------------------------------------------ No results found for: BNP  Micro Results Recent Results (from the past 240 hour(s))  MRSA PCR Screening     Status: None   Collection Time: 01/02/20  5:45 AM   Specimen: Nasopharyngeal  Result Value Ref Range Status   MRSA by PCR NEGATIVE NEGATIVE Final    Comment:        The GeneXpert MRSA Assay (FDA approved for NASAL specimens only), is one component of a comprehensive MRSA colonization surveillance program. It is not intended to  diagnose MRSA infection nor to guide or monitor treatment for MRSA infections. Performed at [facility], [place] [address], [place], [place] [address]     Radiology Reports CT ANGIO CHEST PE W OR WO CONTRAST  Result Date: 01/02/2020 CLINICAL DATA:  Shortness of breath, elevated D-dimer level. EXAM: CT ANGIOGRAPHY CHEST WITH CONTRAST TECHNIQUE: Multidetector CT imaging of the chest was performed using the standard protocol during bolus administration of intravenous contrast. Multiplanar CT image reconstructions and MIPs were obtained to evaluate the vascular anatomy. CONTRAST:  60 mL OMNIPAQUE IOHEXOL 350 MG/ML SOLN COMPARISON:  December 29, 2019. FINDINGS: Cardiovascular: Satisfactory opacification of the pulmonary arteries to the segmental level. No evidence of pulmonary embolism. Normal heart size. No pericardial effusion. Atherosclerosis of thoracic aorta is noted without aneurysm or dissection. Status post coronary bypass graft. Mediastinum/Nodes: No enlarged mediastinal, hilar, or axillary lymph nodes. Thyroid gland, trachea, and esophagus demonstrate no significant findings. Lungs/Pleura: No pneumothorax or pleural effusion is noted. Mild bibasilar subsegmental atelectasis is noted. 8 mm ground-glass opacity is noted laterally in the left upper lobe best seen on image number 28 of series 7 which was  present on prior exam. Upper Abdomen: No acute abnormality. Musculoskeletal: No chest wall abnormality. No acute or significant osseous findings. Review of the MIP images confirms the above findings. IMPRESSION: 1. No definite evidence of pulmonary embolus. 2. Mild bibasilar subsegmental atelectasis is noted. 3. Status post coronary bypass graft. 4. 8 mm ground-glass opacity is noted laterally in the left upper lobe. Initial follow-up by chest CT without contrast is recommended in 3 months to confirm persistence. This recommendation follows the consensus statement: Recommendations for the Management of  Subsolid Pulmonary Nodules Detected at CT: A Statement from the [place] as published in Radiology 2013; 266:304-317. Aortic Atherosclerosis (ICD10-I70.0). Electronically Signed   By: Marijo Conception M.D.   On: 01/02/2020 10:14   ECHOCARDIOGRAM LIMITED  Result Date: 01/02/2020    ECHOCARDIOGRAM LIMITED REPORT   Patient Name:   Leonard Washington Date of Exam: 01/02/2020 Medical Rec #:  [id number]             Height:       71.0 in Accession #:    [id number]            Weight:       253.7 lb Date of Birth:  1946-01-09              BSA:          2.333 m Patient Age:    9 years              BP:           121/69 mmHg Patient Gender: M                     HR:           77 bpm. Exam Location:  Inpatient Procedure: Limited Echo, Cardiac Doppler and Limited Color Doppler Indications:    R94.31 Abnormal EKG  History:        Patient has prior history of Echocardiogram examinations, most                 recent 07/27/2016. Previous Myocardial Infarction,                 Signs/Symptoms:Dyspnea; Risk Factors:Hypertension and                 Dyslipidemia. COVID19 Positive.  Sonographer:    Jonelle Sidle Dance Referring Phys: [place]  1. Left ventricular ejection fraction, by estimation, is 60 to 65%. The left ventricle has normal function. The left ventricle demonstrates global hypokinesis. There is mild left ventricular hypertrophy. Tissue doppler not performed.  2. The right ventricular size is normal.  3. Left atrial size was mild to moderately dilated.  4. Right atrial size was mildly dilated.  5. The mitral valve is grossly normal. Trivial mitral valve regurgitation.  6. The aortic valve is tricuspid. Aortic valve regurgitation is not visualized. Mild aortic valve sclerosis is present, with no evidence of aortic valve stenosis.  7. Limited images due to poor acoustic windowsL FINDINGS  Left Ventricle: Left ventricular ejection fraction, by estimation, is 60 to 65%. The left ventricle has normal  function. The left ventricle demonstrates global hypokinesis. There is mild left ventricular hypertrophy. Right Ventricle: The right ventricular size is normal. No increase in right ventricular wall thickness. Left Atrium: Left atrial size was mild to moderately dilated. Right Atrium: Right atrial size was mildly dilated. Pericardium: There is no evidence of pericardial effusion. Mitral Valve: The mitral  valve is grossly normal. Trivial mitral valve regurgitation. Tricuspid Valve: The tricuspid valve is normal in structure. Tricuspid valve regurgitation is not demonstrated. Aortic Valve: The aortic valve is tricuspid. Aortic valve regurgitation is not visualized. Mild aortic valve sclerosis is present, with no evidence of aortic valve stenosis. There is mild calcification of the aortic valve. Pulmonic Valve: The pulmonic valve was grossly normal. Pulmonic valve regurgitation is trivial. Aorta: The aortic root and ascending aorta are structurally normal, with no evidence of dilitation. Venous: The inferior vena cava was not well visualized.  LEFT VENTRICLE PLAX 2D LVIDd:         4.50 cm LVIDs:         4.10 cm LV PW:         1.20 cm LV IVS:        1.00 cm LVOT diam:     2.20 cm LV SV:         84 LV SV Index:   36 LVOT Area:     3.80 cm  RIGHT VENTRICLE          IVC RV Basal diam:  2.70 cm  IVC diam: 1.90 cm LEFT ATRIUM             Index       RIGHT ATRIUM           Index LA diam:        4.70 cm 2.01 cm/m  RA Area:     14.70 cm LA Vol (A2C):   60.4 ml 25.89 ml/m RA Volume:   36.50 ml  15.64 ml/m LA Vol (A4C):   52.8 ml 22.63 ml/m LA Biplane Vol: 58.3 ml 24.99 ml/m  AORTIC VALVE LVOT Vmax:   123.00 cm/s LVOT Vmean:  75.000 cm/s LVOT VTI:    0.222 m  AORTA Ao Root diam: 3.90 cm Ao Asc diam:  3.20 cm MITRAL VALVE MV Area (PHT): 3.72 cm    SHUNTS MV Decel Time: 204 msec    Systemic VTI:  0.22 m MV E velocity: 76.60 cm/s  Systemic Diam: 2.20 cm MV A velocity: 72.80 cm/s MV E/A ratio:  1.05 Glori Bickers MD  Electronically signed by Glori Bickers MD Signature Date/Time: 01/02/2020/2:30:39 PM    Final

## 2020-01-03 NOTE — Progress Notes (Signed)
ANTICOAGULATION CONSULT NOTE - Follow Up Consult  Pharmacy Consult for Heparin Indication: chest pain/ACS  Allergies  Allergen Reactions  . Zocor [Simvastatin]     Made fingers numb    Patient Measurements: Height: 5\' 11"  (180.3 cm) Weight: 253 lb 12 oz (115.1 kg) IBW/kg (Calculated) : 75.3 Heparin Dosing Weight:  100.3 kg  Vital Signs: Temp: 98 F (36.7 C) (03/06 0345) Temp Source: Oral (03/06 0345) BP: 94/75 (03/06 0513)  Labs: Recent Labs    01/02/20 0532 01/02/20 0727 01/02/20 0727 01/02/20 1800 01/02/20 1900 01/03/20 0359 01/03/20 0447  HGB 12.4*  --   --   --   --  13.9  --   HCT 41.1  --   --   --   --  45.9  --   PLT 286  --   --   --   --  366  --   HEPARINUNFRC  --  <0.10*   < > 0.11* 0.15* 0.16*  --   CREATININE 1.12  --   --   --   --  0.79  --   TROPONINIHS 1,108* 993*  --   --   --   --  282*   < > = values in this interval not displayed.    Estimated Creatinine Clearance: 106.1 mL/min (by C-G formula based on SCr of 0.79 mg/dL).   Assessment: 74 yr old male with hx CAD (S/P MI and stent in 1997, 4-vessel CABG in 2005) was transferred from [facility] with elevated troponins in the setting of COVID pneumonia. Chest CTA was negative for evidence of PE. When pt recovers from [place], Cardiology plans cardiac cath to evaluate coronary arteries and grafts that are 74 yrs old.  Heparin level remains subtherapeutic (0.16) on gtt at 1800 units/hr. No issues with line or bleeding reported per RN.  Goal of Therapy:  Heparin level 0.3-0.7 units/ml Monitor platelets by anticoagulation protocol: Yes   Plan:  Rebolus heparin 3000 units Increase heparin gtt to 2100 units/hr Check heparin level in 8 hrs  Sherlon Handing, PharmD, BCPS Please see amion for complete clinical pharmacist phone list 01/03/2020,5:46 AM

## 2020-01-03 NOTE — Progress Notes (Signed)
Patient in Afib episodes since 0230 through 0530, asymptomatic, no distress. Bodenheimer, MD notified, RRT called.  RRT came in the room.  STAT EKG was performed, 10 mg Cardizem IVP given per order.  500 ml bolus of NS given. SBP dropped to Low 70 and 80.  MD notified another order received for 500 ml bolus of NS.  Patient is resting comfortably in bed at this time, SBP still in the 80s MAP 75.  Patient continues on heparin gtt at  21000 (52ml/hr). Rate/dose was change twice on this shift and bolus of heparin given twice (see mar). No sign of distress noted.  Will continue to monitor

## 2020-01-03 NOTE — Progress Notes (Addendum)
     Dr York Cerise rounding note reviewed, who is also his primary outpatient cardiologist. To decrease patient/provider exposure patient was seen remotely, telemetry and labs reviewed.       1. Elevated troponin in setting of COVID 19 infection/Prior history of CAD - -Troponin I was 1.15 at [facility].  Here his high sensitivity troponins: [id number].This elevation could represent demand ischemia in the setting of COVID-19 pneumonia, which could be more prominent in light of his known CAD with remote hx of CABG. - CTA of the chest was negative for PE. - echo LVEF 60-65%,   - from Dr York Cerise original consult note consider nonurgent cath as outpatient once recovered from [place] given downtrending troponis, no current chest pain, and no significant EKG changes. Echo shows normal LV function - plan for 24 hrs of heparin. Dr Copper had also wanted to add plavix however with afib will start [place] as alternative   2. Afib - episode yesterday evening in setting of COVID 19 infection, no prior history - given 10mg  of IV dilt, has some low SBPs after but rates improved.  - he is on hep gtt for elevated troponin - on lopressor 25mg  tid. Soft bp's SBP 90s to low 100s. Rates variable 90s to 110s. If unable to rate control with lopressor would start amidoarone drip with plans for short term course.  - start eliquis per pharmacy dosing, stop ASA    For questions or updates, please contact [place] Please consult [email or web address] for contact info under        Signed, Carlyle Dolly, MD  01/03/2020, 11:47 AM

## 2020-01-03 NOTE — Significant Event (Signed)
Rapid Response Event Note  Overview: Called d/t HR-110s Afib, BP-85/66. Pt is alert and oriented per RN. RN already paged NP and is awaiting response.  Time Called: [id number] Arrival Time: 0424 Event Type: Cardiac  Initial Focused Assessment: Pt laying in bed, alert and oriented. Pt denies chest pain or SOB. Lungs diminished t/o. Skin warm and dry. T-98, HR-108-140s(afib), BP-105/73, RR 18, SpO2-96% on RA.   Interventions: EKG-Afib with RVR 500cc NS bolus x 1 10mg  cardizem IV Plan of Care (if not transferred): Monitor response to cardizem, finish bolus. Call RRT if further assistance needed.  Event Summary: Name of Physician Notified: Bodenheimer, NP at (PTA RRT)    at    Outcome: Stayed in room and stabalized     [place], Carren Rang

## 2020-01-03 NOTE — Progress Notes (Signed)
HR improves to btw 90s and 110

## 2020-01-04 DIAGNOSIS — I4891 Unspecified atrial fibrillation: Secondary | ICD-10-CM

## 2020-01-04 LAB — CBC
HCT: 46 % (ref 39.0–52.0)
Hemoglobin: 13.8 g/dL (ref 13.0–17.0)
MCH: 23.6 pg — ABNORMAL LOW (ref 26.0–34.0)
MCHC: 30 g/dL (ref 30.0–36.0)
MCV: 78.6 fL — ABNORMAL LOW (ref 80.0–100.0)
Platelets: 405 10*3/uL — ABNORMAL HIGH (ref 150–400)
RBC: 5.85 MIL/uL — ABNORMAL HIGH (ref 4.22–5.81)
RDW: 19.9 % — ABNORMAL HIGH (ref 11.5–15.5)
WBC: 19 10*3/uL — ABNORMAL HIGH (ref 4.0–10.5)
nRBC: 0.1 % (ref 0.0–0.2)

## 2020-01-04 LAB — COMPREHENSIVE METABOLIC PANEL
ALT: 45 U/L — ABNORMAL HIGH (ref 0–44)
AST: 71 U/L — ABNORMAL HIGH (ref 15–41)
Albumin: 1.8 g/dL — ABNORMAL LOW (ref 3.5–5.0)
Alkaline Phosphatase: 66 U/L (ref 38–126)
Anion gap: 8 (ref 5–15)
BUN: 21 mg/dL (ref 8–23)
CO2: 27 mmol/L (ref 22–32)
Calcium: 8.2 mg/dL — ABNORMAL LOW (ref 8.9–10.3)
Chloride: 106 mmol/L (ref 98–111)
Creatinine, Ser: 0.79 mg/dL (ref 0.61–1.24)
GFR calc Af Amer: >60 mL/min (ref >60)
GFR calc non Af Amer: >60 mL/min (ref >60)
Glucose, Bld: 117 mg/dL — ABNORMAL HIGH (ref 70–99)
Potassium: 3.6 mmol/L (ref 3.5–5.1)
Sodium: 141 mmol/L (ref 135–145)
Total Bilirubin: 0.6 mg/dL (ref 0.3–1.2)
Total Protein: 5.1 g/dL — ABNORMAL LOW (ref 6.5–8.1)

## 2020-01-04 LAB — C-REACTIVE PROTEIN: CRP: 7.8 mg/dL — ABNORMAL HIGH (ref <1.0)

## 2020-01-04 LAB — D-DIMER, QUANTITATIVE: D-Dimer, Quant: 4.61 ug/mL-FEU — ABNORMAL HIGH (ref 0.00–0.50)

## 2020-01-04 LAB — FERRITIN: Ferritin: 164 ng/mL (ref 24–336)

## 2020-01-04 MED ORDER — ALPRAZOLAM 0.25 MG PO TABS
0.2500 mg | ORAL_TABLET | Freq: Once | ORAL | Status: AC
  Administered 2020-01-05: 0.25 mg via ORAL
  Filled 2020-01-04: qty 1

## 2020-01-04 NOTE — Progress Notes (Signed)
Bodenheimer, NP text paged that the patient had a 2.72 sec SVR with no complaints of chest pain or discomfort.  He is also complaining of anxiety and insomnia.  Will await orders.  Primary nurse informed.

## 2020-01-04 NOTE — Progress Notes (Signed)
   01/03/20 2216  MEWS Score  BP 102/72  ECG Heart Rate (!) 103  Resp (!) 23  SpO2 93 %  O2 Device Room Air  MEWS Score  MEWS Temp 0  MEWS Systolic 0  MEWS Pulse 1  MEWS RR 1  MEWS LOC 0  MEWS Score 2  MEWS Score Color Yellow  MEWS Assessment  Is this an acute change? No  Provider Notification  Provider Name/Title Bodenheimer, NP  Date Provider Notified 01/03/20  Time Provider Notified 2200  Notification Type Page  Notification Reason Other (Comment) (To clarify if HS Metoprolol should be held.Marland Kitchen)  Response See new orders  Date of Provider Response 01/03/20  Time of Provider Response 2225   Metoprolol 12.5 mg given PO instead of 25 mg d/t SBP<110 per Silas [place], NP. Patient educated of this. Will continue to monitor.

## 2020-01-04 NOTE — Progress Notes (Signed)
     Dr York Cerise rounding note reviewed, who is also his primary outpatient cardiologist. To decrease patient/provider exposure patient was seen remotely, telemetry and labs reviewed.       1. Elevated troponin in setting of COVID 19 infection/Prior history of CAD - -Troponin I was 1.15 at [facility]. Here his high sensitivity troponins: [id number].This elevation could represent demand ischemia in the setting of COVID-19 pneumonia, which could be more prominent in light of his known CAD with remote hx of CABG. - CTA of the chest was negative for PE. - echo LVEF 60-65%,   - from Dr York Cerise original consult note consider nonurgent cath as outpatient once recovered from [place] given downtrending troponis, no current chest pain, and no significant EKG changes. Echo shows normal LV function - plan for 24 hrs of heparin. Dr Copper had also wanted to add plavix however with afib will start [place] as alternative   2. Afib - new diagnosis - episode yesterday evening in setting of COVID 19 infection, no prior history - rate control has been difficult due to soft bp's  - on lopressor 25mg  tid. Evening dose held due to vitals leading to some high rates in early AM. Rates variable from tele check, this AM 90s. SBPs stable low 100s, continue lopressor 25mg  tid - if rates and bp's become bigger issue would plan for short course of IV amiodarone.  - started on eliquis for stroke prevention   Carlyle Dolly MD

## 2020-01-04 NOTE — Progress Notes (Signed)
   01/04/20 0326  MEWS Score  BP 105/63  ECG Heart Rate 90  Resp 17  SpO2 94 %  O2 Device Room Air  MEWS Score  MEWS Temp 0  MEWS Systolic 0  MEWS Pulse 0  MEWS RR 0  MEWS LOC 0  MEWS Score 0  MEWS Score Color Green  Provider Notification  Provider Name/Title Bodenheimer, NP  Date Provider Notified 01/04/20  Time Provider Notified [phone]  Notification Type Page  Notification Reason Other (Comment) (Per tele, pause of 2.5 sec. Patient sleeping,easy to arouse.)  Response No new orders   No distress noted. Patient denies needs. Will continue to monitor.

## 2020-01-04 NOTE — Progress Notes (Signed)
PROGRESS NOTE                                                                                                                                                                                                             Patient Demographics:    Leonard Washington, is a 74 y.o. male, DOB - July 04, 1946, [id number]  Outpatient Primary MD for the patient is [name], MD   Admit date - 01/02/2020   LOS - 2  No chief complaint on file.      Brief Narrative: Patient is a 74 y.o. male with PMHx of CAD s/p CABG, polycythemia rubra-who was transferred from [facility] to [facility] earlier this morning for evaluation of shortness of breath likely secondary to COVID-19 pneumonia.  While at [facility] complained of left-sided chest pain-and had elevated troponin levels of around 1.15.  Significant Events: 3/6>> A. fib with RVR 3/5>> transfer from [facility]/admit to [facility] 3/5>> CTA chest-negative for PE  COVID-19 medications: Remdesivir: 3/4>> Steroids: 3/4>>  Microbiology data: None  Procedures: None  Consults: Cardiology   Subjective:   Lying comfortably in bed-no shortness of breath.  Still in A. fib.  At rest heart rate in the 80s-90s but with ambulation/movement-heart rate still in the 120s-130s range.   Assessment  & Plan :   Covid 19 Viral pneumonia: Stable on room air CRP continues to downtrend.  Remains on steroids and remdesivir.  Follow clinical trajectory and inflammatory markers.    Note-CTA chest on 3/5 - for PE.  Fever: afebrile  O2 requirements:  SpO2: 93 %   COVID-19 Labs: Recent Labs    01/02/20 0532 01/03/20 0359 01/04/20 0232  DDIMER 3.87* 5.88* 4.61*  FERRITIN 194 171 164  LDH 265*  --   --   CRP 21.3* 19.1* 7.8*    No results found for: BNP  Recent Labs  Lab 01/02/20 0532  PROCALCITON 0.34    No results found for: SARSCOV2NAA   Prone/Incentive Spirometry: encouraged   incentive spirometry use 3-4/hour.  DVT Prophylaxis  :   Heparin infusion  Elevated troponin/chest pain: Thought to be atypical-appreciate cardiology input.  Plans are for further work-up in the outpatient setting.   History of CAD s/p CABG: On beta-blocker and statin.  As patient on Eliquis due to A. fib-no longer on aspirin.  Paroxysmal A. fib with  RVR: Rate better controlled but still with RVR with ambulation/movement-continue metoprolol and Eliquis.  Cardiology following and directing care.  Leukocytosis: Could be secondary to steroids-follow for now.  HTN: BP soft-continue with metoprolol.  HLD: Continue statin  Polycythemia rubra: Continue hydroxyurea-follow hemoglobin/hematocrit.  8 mm ground-glass opacity is noted laterally in the left upper lobe: Incidental finding on CT chest-ex-smoker-defer to outpatient MD-but repeat CT chest in 3 to 6 months.  Obesity: Estimated body mass index is 35.39 kg/m as calculated from the following:   Height as of this encounter: 5\' 11"  (1.803 m).   Weight as of this encounter: 115.1 kg.    ABG: No results found for: PHART, PCO2ART, PO2ART, HCO3, TCO2, ACIDBASEDEF, O2SAT  Vent Settings: N/A  Condition -Guarded  Family Communication  :  Spouse updated over the phone on 3/7  Code Status :  Full Code  Diet :  Diet Order            Diet Heart Room service appropriate? Yes; Fluid consistency: Thin  Diet effective now               Disposition Plan  :  Remain hospitalized-Home when medically stable  Barriers to discharge: Complete 5 days of IV Remdesivir, A. fib with RVR  Antimicorbials  :    Anti-infectives (From admission, onward)   Start     Dose/Rate Route Frequency Ordered Stop   01/03/20 1000  remdesivir 100 mg in sodium chloride 0.9 % 100 mL IVPB  Status:  Discontinued     100 mg 200 mL/hr over 30 Minutes Intravenous Daily 01/02/20 0430 01/02/20 0432   01/02/20 1000  remdesivir 100 mg in sodium chloride 0.9 % 100 mL  IVPB     100 mg 200 mL/hr over 30 Minutes Intravenous Daily 01/02/20 0434 01/06/20 0959   01/02/20 0430  remdesivir 200 mg in sodium chloride 0.9% 250 mL IVPB  Status:  Discontinued     200 mg 580 mL/hr over 30 Minutes Intravenous Once 01/02/20 0430 01/02/20 0432      Inpatient Medications  Scheduled Meds: . apixaban  5 mg Oral BID  . vitamin C  500 mg Oral Daily  . dexamethasone (DECADRON) injection  6 mg Intravenous Q24H  . feeding supplement (ENSURE ENLIVE)  237 mL Oral BID BM  . feeding supplement (PRO-STAT SUGAR FREE 64)  30 mL Oral Daily  . hydroxyurea  500 mg Oral Daily  . metoprolol tartrate  25 mg Oral TID  . rosuvastatin  40 mg Oral q1800  . sodium chloride flush  3 mL Intravenous Q12H  . zinc sulfate  220 mg Oral Daily   Continuous Infusions: . sodium chloride    . remdesivir 100 mg in NS 100 mL 100 mg (01/04/20 0939)   PRN Meds:.sodium chloride, sodium chloride flush   Time Spent in minutes  35   See all Orders from today for further details   Oren Binet M.D on 01/04/2020 at 11:27 AM  To page go to [email or web address] - use universal password  [facility] -  Office  [phone]    Objective:   Vitals:   01/04/20 0000 01/04/20 0326 01/04/20 0545 01/04/20 0943  BP:  105/63 105/68 118/77  Pulse:    (!) 119  Resp: 19 17 19    Temp:   97.7 F (36.5 C)   TempSrc:   Oral   SpO2: 93% 94% 93%   Weight:      Height:  Wt Readings from Last 3 Encounters:  01/02/20 115.1 kg  12/11/19 115.1 kg  03/11/19 111.7 kg     Intake/Output Summary (Last 24 hours) at 01/04/2020 1127 Last data filed at 01/04/2020 0540 Gross per 24 hour  Intake 466 ml  Output 1125 ml  Net -659 ml     Physical Exam Gen Exam:Alert awake-not in any distress HEENT:atraumatic, normocephalic Chest: B/L clear to auscultation anteriorly CVS:S1S2 irregular Abdomen:soft non tender, non distended Extremities:no edema Neurology: Non focal Skin: no rash   Data Review:     CBC Recent Labs  Lab 01/02/20 0532 01/03/20 0359 01/04/20 0232  WBC 20.1* 18.2* 19.0*  HGB 12.4* 13.9 13.8  HCT 41.1 45.9 46.0  PLT 286 366 405*  MCV 78.6* 78.3* 78.6*  MCH 23.7* 23.7* 23.6*  MCHC 30.2 30.3 30.0  RDW 19.1* 19.7* 19.9*  LYMPHSABS 0.8  --   --   MONOABS 1.4*  --   --   EOSABS 0.0  --   --   BASOSABS 0.1  --   --     Chemistries  Recent Labs  Lab 01/02/20 0532 01/03/20 0359 01/04/20 0232  NA 136 135 141  K 3.9 3.8 3.6  CL 99 101 106  CO2 27 22 27   GLUCOSE 122* 128* 117*  BUN 13 20 21   CREATININE 1.12 0.79 0.79  CALCIUM 8.2* 8.2* 8.2*  AST 65* 61* 71*  ALT 37 34 45*  ALKPHOS 72 75 66  BILITOT 1.0 1.2 0.6   ------------------------------------------------------------------------------------------------------------------ No results for input(s): CHOL, HDL, LDLCALC, TRIG, CHOLHDL, LDLDIRECT in the last 72 hours.  Lab Results  Component Value Date   HGBA1C 5.0 07/13/2016   ------------------------------------------------------------------------------------------------------------------ No results for input(s): TSH, T4TOTAL, T3FREE, THYROIDAB in the last 72 hours.  Invalid input(s): FREET3 ------------------------------------------------------------------------------------------------------------------ Recent Labs    01/03/20 0359 01/04/20 0232  FERRITIN 171 164    Coagulation profile No results for input(s): INR, PROTIME in the last 168 hours.  Recent Labs    01/03/20 0359 01/04/20 0232  DDIMER 5.88* 4.61*    Cardiac Enzymes No results for input(s): CKMB, TROPONINI, MYOGLOBIN in the last 168 hours.  Invalid input(s): CK ------------------------------------------------------------------------------------------------------------------ No results found for: BNP  Micro Results Recent Results (from the past 240 hour(s))  MRSA PCR Screening     Status: None   Collection Time: 01/02/20  5:45 AM   Specimen: Nasopharyngeal  Result  Value Ref Range Status   MRSA by PCR NEGATIVE NEGATIVE Final    Comment:        The GeneXpert MRSA Assay (FDA approved for NASAL specimens only), is one component of a comprehensive MRSA colonization surveillance program. It is not intended to diagnose MRSA infection nor to guide or monitor treatment for MRSA infections. Performed at [facility], [place] [address], [place], [place] [address]     Radiology Reports CT ANGIO CHEST PE W OR WO CONTRAST  Result Date: 01/02/2020 CLINICAL DATA:  Shortness of breath, elevated D-dimer level. EXAM: CT ANGIOGRAPHY CHEST WITH CONTRAST TECHNIQUE: Multidetector CT imaging of the chest was performed using the standard protocol during bolus administration of intravenous contrast. Multiplanar CT image reconstructions and MIPs were obtained to evaluate the vascular anatomy. CONTRAST:  60 mL OMNIPAQUE IOHEXOL 350 MG/ML SOLN COMPARISON:  December 29, 2019. FINDINGS: Cardiovascular: Satisfactory opacification of the pulmonary arteries to the segmental level. No evidence of pulmonary embolism. Normal heart size. No pericardial effusion. Atherosclerosis of thoracic aorta is noted without aneurysm or dissection. Status post  coronary bypass graft. Mediastinum/Nodes: No enlarged mediastinal, hilar, or axillary lymph nodes. Thyroid gland, trachea, and esophagus demonstrate no significant findings. Lungs/Pleura: No pneumothorax or pleural effusion is noted. Mild bibasilar subsegmental atelectasis is noted. 8 mm ground-glass opacity is noted laterally in the left upper lobe best seen on image number 28 of series 7 which was present on prior exam. Upper Abdomen: No acute abnormality. Musculoskeletal: No chest wall abnormality. No acute or significant osseous findings. Review of the MIP images confirms the above findings. IMPRESSION: 1. No definite evidence of pulmonary embolus. 2. Mild bibasilar subsegmental atelectasis is noted. 3. Status post coronary bypass graft. 4. 8 mm  ground-glass opacity is noted laterally in the left upper lobe. Initial follow-up by chest CT without contrast is recommended in 3 months to confirm persistence. This recommendation follows the consensus statement: Recommendations for the Management of Subsolid Pulmonary Nodules Detected at CT: A Statement from the [place] as published in Radiology 2013; 266:304-317. Aortic Atherosclerosis (ICD10-I70.0). Electronically Signed   By: Marijo Conception M.D.   On: 01/02/2020 10:14   ECHOCARDIOGRAM LIMITED  Result Date: 01/02/2020    ECHOCARDIOGRAM LIMITED REPORT   Patient Name:   BLAIN GUETTER Tadlock Date of Exam: 01/02/2020 Medical Rec #:  [id number]             Height:       71.0 in Accession #:    [id number]            Weight:       253.7 lb Date of Birth:  1946-06-22              BSA:          2.333 m Patient Age:    21 years              BP:           121/69 mmHg Patient Gender: M                     HR:           77 bpm. Exam Location:  Inpatient Procedure: Limited Echo, Cardiac Doppler and Limited Color Doppler Indications:    R94.31 Abnormal EKG  History:        Patient has prior history of Echocardiogram examinations, most                 recent 07/27/2016. Previous Myocardial Infarction,                 Signs/Symptoms:Dyspnea; Risk Factors:Hypertension and                 Dyslipidemia. COVID19 Positive.  Sonographer:    Jonelle Sidle Dance Referring Phys: Stewart  1. Left ventricular ejection fraction, by estimation, is 60 to 65%. The left ventricle has normal function. The left ventricle demonstrates global hypokinesis. There is mild left ventricular hypertrophy. Tissue doppler not performed.  2. The right ventricular size is normal.  3. Left atrial size was mild to moderately dilated.  4. Right atrial size was mildly dilated.  5. The mitral valve is grossly normal. Trivial mitral valve regurgitation.  6. The aortic valve is tricuspid. Aortic valve regurgitation is not visualized.  Mild aortic valve sclerosis is present, with no evidence of aortic valve stenosis.  7. Limited images due to poor acoustic windowsL FINDINGS  Left Ventricle: Left ventricular ejection fraction, by estimation, is 60 to 65%. The left ventricle has normal  function. The left ventricle demonstrates global hypokinesis. There is mild left ventricular hypertrophy. Right Ventricle: The right ventricular size is normal. No increase in right ventricular wall thickness. Left Atrium: Left atrial size was mild to moderately dilated. Right Atrium: Right atrial size was mildly dilated. Pericardium: There is no evidence of pericardial effusion. Mitral Valve: The mitral valve is grossly normal. Trivial mitral valve regurgitation. Tricuspid Valve: The tricuspid valve is normal in structure. Tricuspid valve regurgitation is not demonstrated. Aortic Valve: The aortic valve is tricuspid. Aortic valve regurgitation is not visualized. Mild aortic valve sclerosis is present, with no evidence of aortic valve stenosis. There is mild calcification of the aortic valve. Pulmonic Valve: The pulmonic valve was grossly normal. Pulmonic valve regurgitation is trivial. Aorta: The aortic root and ascending aorta are structurally normal, with no evidence of dilitation. Venous: The inferior vena cava was not well visualized.  LEFT VENTRICLE PLAX 2D LVIDd:         4.50 cm LVIDs:         4.10 cm LV PW:         1.20 cm LV IVS:        1.00 cm LVOT diam:     2.20 cm LV SV:         84 LV SV Index:   36 LVOT Area:     3.80 cm  RIGHT VENTRICLE          IVC RV Basal diam:  2.70 cm  IVC diam: 1.90 cm LEFT ATRIUM             Index       RIGHT ATRIUM           Index LA diam:        4.70 cm 2.01 cm/m  RA Area:     14.70 cm LA Vol (A2C):   60.4 ml 25.89 ml/m RA Volume:   36.50 ml  15.64 ml/m LA Vol (A4C):   52.8 ml 22.63 ml/m LA Biplane Vol: 58.3 ml 24.99 ml/m  AORTIC VALVE LVOT Vmax:   123.00 cm/s LVOT Vmean:  75.000 cm/s LVOT VTI:    0.222 m  AORTA Ao Root  diam: 3.90 cm Ao Asc diam:  3.20 cm MITRAL VALVE MV Area (PHT): 3.72 cm    SHUNTS MV Decel Time: 204 msec    Systemic VTI:  0.22 m MV E velocity: 76.60 cm/s  Systemic Diam: 2.20 cm MV A velocity: 72.80 cm/s MV E/A ratio:  1.05 Glori Bickers MD Electronically signed by Glori Bickers MD Signature Date/Time: 01/02/2020/2:30:39 PM    Final

## 2020-01-04 NOTE — Progress Notes (Signed)
Patient A&O x4, status update given. Questions answered. Patient verbalizes understanding. Patient pleasant and cooperative with assessment and meds. Patient does not want HS snack. Patient steady and able to ambulate in room independently. This RN encouraged patient to ambulate in room to improve health. Patient encouraged to ambulate in hallway, with supervision at first. Patient states that he will ambulate in the hallway tomorrow. Patient resting in bed at this time, call light within reach. Will continue to monitor.

## 2020-01-05 LAB — COMPREHENSIVE METABOLIC PANEL
ALT: 70 U/L — ABNORMAL HIGH (ref 0–44)
AST: 123 U/L — ABNORMAL HIGH (ref 15–41)
Albumin: 2 g/dL — ABNORMAL LOW (ref 3.5–5.0)
Alkaline Phosphatase: 72 U/L (ref 38–126)
Anion gap: 10 (ref 5–15)
BUN: 23 mg/dL (ref 8–23)
CO2: 26 mmol/L (ref 22–32)
Calcium: 8.2 mg/dL — ABNORMAL LOW (ref 8.9–10.3)
Chloride: 106 mmol/L (ref 98–111)
Creatinine, Ser: 0.97 mg/dL (ref 0.61–1.24)
GFR calc Af Amer: >60 mL/min (ref >60)
GFR calc non Af Amer: >60 mL/min (ref >60)
Glucose, Bld: 97 mg/dL (ref 70–99)
Potassium: 3.7 mmol/L (ref 3.5–5.1)
Sodium: 142 mmol/L (ref 135–145)
Total Bilirubin: 0.9 mg/dL (ref 0.3–1.2)
Total Protein: 5.3 g/dL — ABNORMAL LOW (ref 6.5–8.1)

## 2020-01-05 LAB — CBC
HCT: 50.5 % (ref 39.0–52.0)
Hemoglobin: 14.9 g/dL (ref 13.0–17.0)
MCH: 23.7 pg — ABNORMAL LOW (ref 26.0–34.0)
MCHC: 29.5 g/dL — ABNORMAL LOW (ref 30.0–36.0)
MCV: 80.4 fL (ref 80.0–100.0)
Platelets: 508 10*3/uL — ABNORMAL HIGH (ref 150–400)
RBC: 6.28 MIL/uL — ABNORMAL HIGH (ref 4.22–5.81)
RDW: 20.5 % — ABNORMAL HIGH (ref 11.5–15.5)
WBC: 18.1 10*3/uL — ABNORMAL HIGH (ref 4.0–10.5)
nRBC: 0.2 % (ref 0.0–0.2)

## 2020-01-05 LAB — FERRITIN: Ferritin: 132 ng/mL (ref 24–336)

## 2020-01-05 LAB — C-REACTIVE PROTEIN: CRP: 3.7 mg/dL — ABNORMAL HIGH (ref <1.0)

## 2020-01-05 LAB — TSH: TSH: 4.735 u[IU]/mL — ABNORMAL HIGH (ref 0.350–4.500)

## 2020-01-05 LAB — D-DIMER, QUANTITATIVE: D-Dimer, Quant: 3.4 ug/mL-FEU — ABNORMAL HIGH (ref 0.00–0.50)

## 2020-01-05 LAB — T4, FREE: Free T4: 1.39 ng/dL — ABNORMAL HIGH (ref 0.61–1.12)

## 2020-01-05 MED ORDER — ALPRAZOLAM 0.25 MG PO TABS
0.2500 mg | ORAL_TABLET | Freq: Three times a day (TID) | ORAL | Status: DC | PRN
  Administered 2020-01-05 – 2020-01-06 (×4): 0.25 mg via ORAL
  Filled 2020-01-05 (×4): qty 1

## 2020-01-05 MED ORDER — FUROSEMIDE 10 MG/ML IJ SOLN
20.0000 mg | Freq: Once | INTRAMUSCULAR | Status: AC
  Administered 2020-01-05: 20 mg via INTRAVENOUS
  Filled 2020-01-05: qty 2

## 2020-01-05 MED ORDER — METOPROLOL TARTRATE 50 MG PO TABS
50.0000 mg | ORAL_TABLET | Freq: Three times a day (TID) | ORAL | Status: DC
  Administered 2020-01-05 – 2020-01-06 (×3): 50 mg via ORAL
  Filled 2020-01-05 (×4): qty 1

## 2020-01-05 NOTE — Progress Notes (Signed)
PROGRESS NOTE                                                                                                                                                                                                             Patient Demographics:    Leonard Washington, is a 74 y.o. male, DOB - 1945/11/06, [id number]  Outpatient Primary MD for the patient is [name], MD   Admit date - 01/02/2020   LOS - 3  No chief complaint on file.      Brief Narrative: Patient is a 74 y.o. male with PMHx of CAD s/p CABG, polycythemia rubra-who was transferred from [facility] to [facility] earlier this morning for evaluation of shortness of breath likely secondary to COVID-19 pneumonia.  While at [facility] complained of left-sided chest pain-and had elevated troponin levels of around 1.15.  Significant Events: 3/6>> A. fib with RVR 3/5>> transfer from [facility]/admit to [facility] 3/5>> CTA chest-negative for PE  COVID-19 medications: Remdesivir: 3/4>> Steroids: 3/4>>  Microbiology data: None  Procedures: None  Consults: Cardiology   Subjective:   Feels slightly short of breath this morning-says his " breathing is not regulated"-unable to elaborate further.  Denies any chest pain.   Assessment  & Plan :   Covid 19 Viral pneumonia: Stable on room air-CRP much better-only minimally elevated.  Will complete steroid/remdesivir today.   Note-CTA chest on 3/5 - for PE.  Fever: afebrile  O2 requirements:  SpO2: 96 %   COVID-19 Labs: Recent Labs    01/03/20 0359 01/04/20 0232 01/05/20 0232  DDIMER 5.88* 4.61* 3.40*  FERRITIN 171 164 132  CRP 19.1* 7.8* 3.7*    No results found for: BNP  Recent Labs  Lab 01/02/20 0532  PROCALCITON 0.34    No results found for: SARSCOV2NAA   Prone/Incentive Spirometry: encouraged  incentive spirometry use 3-4/hour.  DVT Prophylaxis  :   Heparin infusion  Elevated  troponin/chest pain: Thought to be atypical-appreciate cardiology input.  Plans are for further work-up in the outpatient setting.   History of CAD s/p CABG: On beta-blocker and statin.  As patient on Eliquis due to A. fib-no longer on aspirin.  Paroxysmal A. fib with RVR: Continues to have RVR mostly with ambulation-but this morning heart rate in the 120s at rest as well.  Cardiology has increased metoprolol to 50  mg 3 times daily.  Remains on Eliquis.  Echo with preserved EF.  TSH pending  Leukocytosis: Could be secondary to steroids-follow for now.  HTN: BP soft-continue with metoprolol.  HLD: Continue statin  Polycythemia rubra: Continue hydroxyurea-follow hemoglobin/hematocrit.  8 mm ground-glass opacity is noted laterally in the left upper lobe: Incidental finding on CT chest-ex-smoker-defer to outpatient MD-but repeat CT chest in 3 to 6 months.  Obesity: Estimated body mass index is 35.39 kg/m as calculated from the following:   Height as of this encounter: 5\' 11"  (1.803 m).   Weight as of this encounter: 115.1 kg.    ABG: No results found for: PHART, PCO2ART, PO2ART, HCO3, TCO2, ACIDBASEDEF, O2SAT  Vent Settings: N/A  Condition -Guarded  Family Communication  :  Spouse updated over the phone on 3/8  Code Status :  Full Code  Diet :  Diet Order            Diet Heart Room service appropriate? Yes; Fluid consistency: Thin  Diet effective now               Disposition Plan  :  Home when Heart rate better  Barriers to discharge:  A. fib with RVR  Antimicorbials  :    Anti-infectives (From admission, onward)   Start     Dose/Rate Route Frequency Ordered Stop   01/03/20 1000  remdesivir 100 mg in sodium chloride 0.9 % 100 mL IVPB  Status:  Discontinued     100 mg 200 mL/hr over 30 Minutes Intravenous Daily 01/02/20 0430 01/02/20 0432   01/02/20 1000  remdesivir 100 mg in sodium chloride 0.9 % 100 mL IVPB     100 mg 200 mL/hr over 30 Minutes Intravenous Daily  01/02/20 0434 01/05/20 0917   01/02/20 0430  remdesivir 200 mg in sodium chloride 0.9% 250 mL IVPB  Status:  Discontinued     200 mg 580 mL/hr over 30 Minutes Intravenous Once 01/02/20 0430 01/02/20 0432      Inpatient Medications  Scheduled Meds: . apixaban  5 mg Oral BID  . vitamin C  500 mg Oral Daily  . feeding supplement (ENSURE ENLIVE)  237 mL Oral BID BM  . feeding supplement (PRO-STAT SUGAR FREE 64)  30 mL Oral Daily  . hydroxyurea  500 mg Oral Daily  . metoprolol tartrate  50 mg Oral TID  . rosuvastatin  40 mg Oral q1800  . sodium chloride flush  3 mL Intravenous Q12H  . zinc sulfate  220 mg Oral Daily   Continuous Infusions: . sodium chloride     PRN Meds:.sodium chloride, ALPRAZolam, sodium chloride flush   Time Spent in minutes  35   See all Orders from today for further details   Oren Binet M.D on 01/05/2020 at 1:25 PM  To page go to [email or web address] - use universal password  [facility] -  Office  [phone]    Objective:   Vitals:   01/05/20 0536 01/05/20 0625 01/05/20 0638 01/05/20 0807  BP: 110/75   103/78  Pulse:    78  Resp: (!) 21 (!) 24 (!) 23 15  Temp: 97.9 F (36.6 C)   98.3 F (36.8 C)  TempSrc: Axillary   Oral  SpO2: 96%   96%  Weight:      Height:        Wt Readings from Last 3 Encounters:  01/02/20 115.1 kg  12/11/19 115.1 kg  03/11/19 111.7 kg     Intake/Output Summary (Last  24 hours) at 01/05/2020 1325 Last data filed at 01/05/2020 0550 Gross per 24 hour  Intake 240 ml  Output 800 ml  Net -560 ml     Physical Exam Gen Exam:Alert awake-not in any distress HEENT:atraumatic, normocephalic Chest: B/L clear to auscultation anteriorly CVS:S1S2 irregular Abdomen:soft non tender, non distended Extremities:no edema Neurology: Non focal Skin: no rash   Data Review:    CBC Recent Labs  Lab 01/02/20 0532 01/03/20 0359 01/04/20 0232 01/05/20 0232  WBC 20.1* 18.2* 19.0* 18.1*  HGB 12.4* 13.9 13.8 14.9  HCT  41.1 45.9 46.0 50.5  PLT 286 366 405* 508*  MCV 78.6* 78.3* 78.6* 80.4  MCH 23.7* 23.7* 23.6* 23.7*  MCHC 30.2 30.3 30.0 29.5*  RDW 19.1* 19.7* 19.9* 20.5*  LYMPHSABS 0.8  --   --   --   MONOABS 1.4*  --   --   --   EOSABS 0.0  --   --   --   BASOSABS 0.1  --   --   --     Chemistries  Recent Labs  Lab 01/02/20 0532 01/03/20 0359 01/04/20 0232 01/05/20 0232  NA 136 135 141 142  K 3.9 3.8 3.6 3.7  CL 99 101 106 106  CO2 27 22 27 26   GLUCOSE 122* 128* 117* 97  BUN 13 20 21 23   CREATININE 1.12 0.79 0.79 0.97  CALCIUM 8.2* 8.2* 8.2* 8.2*  AST 65* 61* 71* 123*  ALT 37 34 45* 70*  ALKPHOS 72 75 66 72  BILITOT 1.0 1.2 0.6 0.9   ------------------------------------------------------------------------------------------------------------------ No results for input(s): CHOL, HDL, LDLCALC, TRIG, CHOLHDL, LDLDIRECT in the last 72 hours.  Lab Results  Component Value Date   HGBA1C 5.0 07/13/2016   ------------------------------------------------------------------------------------------------------------------ No results for input(s): TSH, T4TOTAL, T3FREE, THYROIDAB in the last 72 hours.  Invalid input(s): FREET3 ------------------------------------------------------------------------------------------------------------------ Recent Labs    01/04/20 0232 01/05/20 0232  FERRITIN 164 132    Coagulation profile No results for input(s): INR, PROTIME in the last 168 hours.  Recent Labs    01/04/20 0232 01/05/20 0232  DDIMER 4.61* 3.40*    Cardiac Enzymes No results for input(s): CKMB, TROPONINI, MYOGLOBIN in the last 168 hours.  Invalid input(s): CK ------------------------------------------------------------------------------------------------------------------ No results found for: BNP  Micro Results Recent Results (from the past 240 hour(s))  MRSA PCR Screening     Status: None   Collection Time: 01/02/20  5:45 AM   Specimen: Nasopharyngeal  Result Value Ref  Range Status   MRSA by PCR NEGATIVE NEGATIVE Final    Comment:        The GeneXpert MRSA Assay (FDA approved for NASAL specimens only), is one component of a comprehensive MRSA colonization surveillance program. It is not intended to diagnose MRSA infection nor to guide or monitor treatment for MRSA infections. Performed at [facility], [place] [address] [place], [place] [address]     Radiology Reports CT ANGIO CHEST PE W OR WO CONTRAST  Result Date: 01/02/2020 CLINICAL DATA:  Shortness of breath, elevated D-dimer level. EXAM: CT ANGIOGRAPHY CHEST WITH CONTRAST TECHNIQUE: Multidetector CT imaging of the chest was performed using the standard protocol during bolus administration of intravenous contrast. Multiplanar CT image reconstructions and MIPs were obtained to evaluate the vascular anatomy. CONTRAST:  60 mL OMNIPAQUE IOHEXOL 350 MG/ML SOLN COMPARISON:  December 29, 2019. FINDINGS: Cardiovascular: Satisfactory opacification of the pulmonary arteries to the segmental level. No evidence of pulmonary embolism. Normal heart size. No pericardial effusion. Atherosclerosis  of thoracic aorta is noted without aneurysm or dissection. Status post coronary bypass graft. Mediastinum/Nodes: No enlarged mediastinal, hilar, or axillary lymph nodes. Thyroid gland, trachea, and esophagus demonstrate no significant findings. Lungs/Pleura: No pneumothorax or pleural effusion is noted. Mild bibasilar subsegmental atelectasis is noted. 8 mm ground-glass opacity is noted laterally in the left upper lobe best seen on image number 28 of series 7 which was present on prior exam. Upper Abdomen: No acute abnormality. Musculoskeletal: No chest wall abnormality. No acute or significant osseous findings. Review of the MIP images confirms the above findings. IMPRESSION: 1. No definite evidence of pulmonary embolus. 2. Mild bibasilar subsegmental atelectasis is noted. 3. Status post coronary bypass graft. 4. 8 mm ground-glass  opacity is noted laterally in the left upper lobe. Initial follow-up by chest CT without contrast is recommended in 3 months to confirm persistence. This recommendation follows the consensus statement: Recommendations for the Management of Subsolid Pulmonary Nodules Detected at CT: A Statement from the [place] as published in Radiology 2013; 266:304-317. Aortic Atherosclerosis (ICD10-I70.0). Electronically Signed   By: Marijo Conception M.D.   On: 01/02/2020 10:14   ECHOCARDIOGRAM LIMITED  Result Date: 01/02/2020    ECHOCARDIOGRAM LIMITED REPORT   Patient Name:   Leonard Washington Date of Exam: 01/02/2020 Medical Rec #:  [id number]             Height:       71.0 in Accession #:    [id number]            Weight:       253.7 lb Date of Birth:  1946-04-21              BSA:          2.333 m Patient Age:    28 years              BP:           121/69 mmHg Patient Gender: M                     HR:           77 bpm. Exam Location:  Inpatient Procedure: Limited Echo, Cardiac Doppler and Limited Color Doppler Indications:    R94.31 Abnormal EKG  History:        Patient has prior history of Echocardiogram examinations, most                 recent 07/27/2016. Previous Myocardial Infarction,                 Signs/Symptoms:Dyspnea; Risk Factors:Hypertension and                 Dyslipidemia. COVID19 Positive.  Sonographer:    Jonelle Sidle Dance Referring Phys: [place]  1. Left ventricular ejection fraction, by estimation, is 60 to 65%. The left ventricle has normal function. The left ventricle demonstrates global hypokinesis. There is mild left ventricular hypertrophy. Tissue doppler not performed.  2. The right ventricular size is normal.  3. Left atrial size was mild to moderately dilated.  4. Right atrial size was mildly dilated.  5. The mitral valve is grossly normal. Trivial mitral valve regurgitation.  6. The aortic valve is tricuspid. Aortic valve regurgitation is not visualized. Mild aortic  valve sclerosis is present, with no evidence of aortic valve stenosis.  7. Limited images due to poor acoustic windowsL FINDINGS  Left Ventricle: Left ventricular ejection fraction,  by estimation, is 60 to 65%. The left ventricle has normal function. The left ventricle demonstrates global hypokinesis. There is mild left ventricular hypertrophy. Right Ventricle: The right ventricular size is normal. No increase in right ventricular wall thickness. Left Atrium: Left atrial size was mild to moderately dilated. Right Atrium: Right atrial size was mildly dilated. Pericardium: There is no evidence of pericardial effusion. Mitral Valve: The mitral valve is grossly normal. Trivial mitral valve regurgitation. Tricuspid Valve: The tricuspid valve is normal in structure. Tricuspid valve regurgitation is not demonstrated. Aortic Valve: The aortic valve is tricuspid. Aortic valve regurgitation is not visualized. Mild aortic valve sclerosis is present, with no evidence of aortic valve stenosis. There is mild calcification of the aortic valve. Pulmonic Valve: The pulmonic valve was grossly normal. Pulmonic valve regurgitation is trivial. Aorta: The aortic root and ascending aorta are structurally normal, with no evidence of dilitation. Venous: The inferior vena cava was not well visualized.  LEFT VENTRICLE PLAX 2D LVIDd:         4.50 cm LVIDs:         4.10 cm LV PW:         1.20 cm LV IVS:        1.00 cm LVOT diam:     2.20 cm LV SV:         84 LV SV Index:   36 LVOT Area:     3.80 cm  RIGHT VENTRICLE          IVC RV Basal diam:  2.70 cm  IVC diam: 1.90 cm LEFT ATRIUM             Index       RIGHT ATRIUM           Index LA diam:        4.70 cm 2.01 cm/m  RA Area:     14.70 cm LA Vol (A2C):   60.4 ml 25.89 ml/m RA Volume:   36.50 ml  15.64 ml/m LA Vol (A4C):   52.8 ml 22.63 ml/m LA Biplane Vol: 58.3 ml 24.99 ml/m  AORTIC VALVE LVOT Vmax:   123.00 cm/s LVOT Vmean:  75.000 cm/s LVOT VTI:    0.222 m  AORTA Ao Root diam: 3.90 cm  Ao Asc diam:  3.20 cm MITRAL VALVE MV Area (PHT): 3.72 cm    SHUNTS MV Decel Time: 204 msec    Systemic VTI:  0.22 m MV E velocity: 76.60 cm/s  Systemic Diam: 2.20 cm MV A velocity: 72.80 cm/s MV E/A ratio:  1.05 Glori Bickers MD Electronically signed by Glori Bickers MD Signature Date/Time: 01/02/2020/2:30:39 PM    Final

## 2020-01-05 NOTE — Progress Notes (Addendum)
Progress Note  Due to the COVID-19 pandemic, this visit was completed with telemedicine (audio/video) technology to reduce patient and provider exposure as well as to preserve personal protective equipment.   Patient Name: Leonard Washington Date of Encounter: 01/05/2020  Primary Cardiologist: Sherren Mocha, MD   Subjective   He was told his Covid seems to be improving. However, he still notes episodic SOB. Comes in waves, sometimes while doing nothing at all. He has poor exercise tolerance even with just getting out of bed (HR went from 120-160s). He indicates he was told he has some swelling on exam although cannot see it for himself. Denies orthopnea. Lasix 20mg  IV given this morning.  Inpatient Medications    Scheduled Meds: . apixaban  5 mg Oral BID  . vitamin C  500 mg Oral Daily  . feeding supplement (ENSURE ENLIVE)  237 mL Oral BID BM  . feeding supplement (PRO-STAT SUGAR FREE 64)  30 mL Oral Daily  . hydroxyurea  500 mg Oral Daily  . metoprolol tartrate  25 mg Oral TID  . rosuvastatin  40 mg Oral q1800  . sodium chloride flush  3 mL Intravenous Q12H  . zinc sulfate  220 mg Oral Daily   Continuous Infusions: . sodium chloride     PRN Meds: sodium chloride, ALPRAZolam, sodium chloride flush   Vital Signs    Vitals:   01/05/20 0536 01/05/20 0625 01/05/20 0638 01/05/20 0807  BP: 110/75   103/78  Pulse:    78  Resp: (!) 21 (!) 24 (!) 23 15  Temp: 97.9 F (36.6 C)   98.3 F (36.8 C)  TempSrc: Axillary   Oral  SpO2: 96%   96%  Weight:      Height:        Intake/Output Summary (Last 24 hours) at 01/05/2020 1117 Last data filed at 01/05/2020 0550 Gross per 24 hour  Intake 240 ml  Output 800 ml  Net -560 ml   Last 3 Weights 01/02/2020 12/11/2019 03/11/2019  Weight (lbs) 253 lb 12 oz 253 lb 12.8 oz 246 lb 3.2 oz  Weight (kg) 115.1 kg 115.123 kg 111.676 kg      Telemetry    Atrial fib HR 90s currently, up to 140s-160s with activity - Personally  Reviewed  Physical Exam   VITAL SIGNS:  reviewed  General - pleasant male in no acute distress Pulm - No labored breathing, no coughing during visit, no audible wheezing, speaking in full sentences Neuro - A+Ox3, no slurred speech, answers questions appropriately Psych - Pleasant affect   Labs    Chemistry Recent Labs  Lab 01/03/20 0359 01/04/20 0232 01/05/20 0232  NA 135 141 142  K 3.8 3.6 3.7  CL 101 106 106  CO2 22 27 26   GLUCOSE 128* 117* 97  BUN 20 21 23   CREATININE 0.79 0.79 0.97  CALCIUM 8.2* 8.2* 8.2*  PROT 5.6* 5.1* 5.3*  ALBUMIN 1.9* 1.8* 2.0*  AST 61* 71* 123*  ALT 34 45* 70*  ALKPHOS 75 66 72  BILITOT 1.2 0.6 0.9  GFRNONAA >60 >60 >60  GFRAA >60 >60 >60  ANIONGAP 12 8 10      Hematology Recent Labs  Lab 01/03/20 0359 01/04/20 0232 01/05/20 0232  WBC 18.2* 19.0* 18.1*  RBC 5.86* 5.85* 6.28*  HGB 13.9 13.8 14.9  HCT 45.9 46.0 50.5  MCV 78.3* 78.6* 80.4  MCH 23.7* 23.6* 23.7*  MCHC 30.3 30.0 29.5*  RDW 19.7* 19.9* 20.5*  PLT 366  405* 508*    Cardiac EnzymesNo results for input(s): TROPONINI in the last 168 hours. No results for input(s): TROPIPOC in the last 168 hours.   BNPNo results for input(s): BNP, PROBNP in the last 168 hours.   DDimer  Recent Labs  Lab 01/03/20 0359 01/04/20 0232 01/05/20 0232  DDIMER 5.88* 4.61* 3.40*     Radiology    No results found.  Cardiac Studies   2D Echo 01/02/20 1. Left ventricular ejection fraction, by estimation, is 60 to 65%. The  left ventricle has normal function. The left ventricle demonstrates global  hypokinesis. There is mild left ventricular hypertrophy. Tissue doppler  not performed.  2. The right ventricular size is normal.  3. Left atrial size was mild to moderately dilated.  4. Right atrial size was mildly dilated.  5. The mitral valve is grossly normal. Trivial mitral valve  regurgitation.  6. The aortic valve is tricuspid. Aortic valve regurgitation is not  visualized.  Mild aortic valve sclerosis is present, with no evidence of  aortic valve stenosis.  7. Limited images due to poor acoustic windowsL   Patient Profile     74 y.o. 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 Last seen in office 12/2019 with vague exertional symptoms with plan for nuclear stress test - OP nuc was planned but was admitted with Covid in the meantime. Presented to [facility] with SOB felt secondary to viral PNA from Covid-19. As part of workup, had elevated troponins of 1.5 at [place] and 1108 here. His pattern of ST segment elevation raised the possibility of myopericarditis. CTA was negative for PE. During admission was noted to go into atrial fibrillation. Blood pressure has been soft.  Assessment & Plan    1. Elevated troponin in setting of Covid-19 infection with prior history of CAD - this could represent demand ischemia in setting of Covid 19 PNA, vs progression of underlying CAD, vs underlying myopericarditis related to Covid. CTA 01/02/20 negative for PE. Per Dr. Antionette Char note (primary cardiologist), he recommended to consider non-urgent cath as outpatient once recovered from Covid given downtrending troponins, no current chest pain, and normal LV function without WMA. I have sent a message to the office to cancel his previously arranged stress test. He was on heparin initially and there was a plan to add Plavix. However, since that time he's been found to go into atrial fib as well and is now on apixaban. Will clarify with MD whether he should be on ASA + apixaban for the time being (given elevated troponin) or just apixaban.  2. Newly diagnosed atrial fibrillation - CHADSVASC 3. Rate seems reasonably controlled when resting but easily goes up to 120s-160s with ambulation. On Lopressor 25mg  TID. Atrial size is moderately dilated. Question whether we trial titrating Lopressor further vs addition of digoxin. The hope  would be that tendency towards atrial fib/tachycardia would subside as Covid-19 infection resolves but will need follow-up as outpatient - for this reason we are not typically doing TEE/DCCVs while patients are acutely symptomatic with Covid as they may have a tendency to revert back to AF. IM has planned dose of IV Lasix this AM. I will review with Dr. Marlou Porch. Will also check thyroid function.  3. Essential hypertension - BP remains low-normal.  4. Hyperlipidemia - continue statin.  5. Covid-19 infection - being treated by IM. Abnormal LFTs noted, low albumin, persistent leukocytosis.  For questions or updates,  please contact [place] Please consult [email or web address] for contact info under      Signed, Charlie Pitter, PA-C  01/05/2020, 11:17 AM     Spoke to him via telephonic device, agree with above.  Minimal cough during speaking.  Normal respiratory effort.  Atrial fibrillation with rapid ventricular response especially during exercise in the setting of Covid pneumonia. -Agree with trying to increase metoprolol for further rate control.  Metoprolol should not push the blood pressure down too much.  I will change to 50, 3 times a day with hold parameter hold for systolic less than 95 as illness improves, atrial fibrillation should improve as well.  I do not think that we need to perform urgent TEE cardioversion.  Okay with IV Lasix this morning.  Candee Furbish, MD

## 2020-01-05 NOTE — Progress Notes (Signed)
Patient decided he wanted to sit up shortly after 6 am.  Patient HR increased to 160+ at 0616.  With decrease in activity, HR decreased to 120's to 130's. Bodenheimer, NP paged.  Patient has extremely low activity tolerance.

## 2020-01-06 ENCOUNTER — Inpatient Hospital Stay (HOSPITAL_COMMUNITY): Payer: Medicare Other

## 2020-01-06 LAB — CBC
HCT: 52.4 % — ABNORMAL HIGH (ref 39.0–52.0)
Hemoglobin: 15.2 g/dL (ref 13.0–17.0)
MCH: 23.6 pg — ABNORMAL LOW (ref 26.0–34.0)
MCHC: 29 g/dL — ABNORMAL LOW (ref 30.0–36.0)
MCV: 81.4 fL (ref 80.0–100.0)
Platelets: 532 10*3/uL — ABNORMAL HIGH (ref 150–400)
RBC: 6.44 MIL/uL — ABNORMAL HIGH (ref 4.22–5.81)
RDW: 21.2 % — ABNORMAL HIGH (ref 11.5–15.5)
WBC: 18.3 10*3/uL — ABNORMAL HIGH (ref 4.0–10.5)
nRBC: 0.3 % — ABNORMAL HIGH (ref 0.0–0.2)

## 2020-01-06 LAB — COMPREHENSIVE METABOLIC PANEL
ALT: 58 U/L — ABNORMAL HIGH (ref 0–44)
AST: 66 U/L — ABNORMAL HIGH (ref 15–41)
Albumin: 2.1 g/dL — ABNORMAL LOW (ref 3.5–5.0)
Alkaline Phosphatase: 71 U/L (ref 38–126)
Anion gap: 10 (ref 5–15)
BUN: 21 mg/dL (ref 8–23)
CO2: 23 mmol/L (ref 22–32)
Calcium: 8.3 mg/dL — ABNORMAL LOW (ref 8.9–10.3)
Chloride: 107 mmol/L (ref 98–111)
Creatinine, Ser: 0.85 mg/dL (ref 0.61–1.24)
GFR calc Af Amer: >60 mL/min (ref >60)
GFR calc non Af Amer: >60 mL/min (ref >60)
Glucose, Bld: 83 mg/dL (ref 70–99)
Potassium: 4 mmol/L (ref 3.5–5.1)
Sodium: 140 mmol/L (ref 135–145)
Total Bilirubin: 0.6 mg/dL (ref 0.3–1.2)
Total Protein: 5.4 g/dL — ABNORMAL LOW (ref 6.5–8.1)

## 2020-01-06 LAB — C-REACTIVE PROTEIN: CRP: 1.6 mg/dL — ABNORMAL HIGH (ref <1.0)

## 2020-01-06 LAB — D-DIMER, QUANTITATIVE: D-Dimer, Quant: 2.67 ug/mL-FEU — ABNORMAL HIGH (ref 0.00–0.50)

## 2020-01-06 LAB — BRAIN NATRIURETIC PEPTIDE: B Natriuretic Peptide: 300.1 pg/mL — ABNORMAL HIGH (ref 0.0–100.0)

## 2020-01-06 MED ORDER — DILTIAZEM HCL 60 MG PO TABS
30.0000 mg | ORAL_TABLET | Freq: Four times a day (QID) | ORAL | Status: DC
  Administered 2020-01-06 – 2020-01-07 (×5): 30 mg via ORAL
  Filled 2020-01-06 (×5): qty 1

## 2020-01-06 MED ORDER — METOPROLOL TARTRATE 50 MG PO TABS
50.0000 mg | ORAL_TABLET | Freq: Four times a day (QID) | ORAL | Status: DC
  Administered 2020-01-06 – 2020-01-07 (×4): 50 mg via ORAL
  Filled 2020-01-06 (×4): qty 1

## 2020-01-06 NOTE — Progress Notes (Addendum)
Progress Note  Due to the COVID-19 pandemic, this visit was completed with telemedicine (audio/video) technology to reduce patient and provider exposure as well as to preserve personal protective equipment.   Patient Name: Leonard Washington Date of Encounter: 01/06/2020  Primary Cardiologist: Sherren Mocha, MD   Subjective   Reports excellent UOP response yesterday to 20mg  IV Lasix. Was doing OK, then this morning around 6am began to feel paroxysms of dyspnea again. Despite increasing metoprolol yesterday, HR occasionally rising to 140s.  Inpatient Medications    Scheduled Meds: . apixaban  5 mg Oral BID  . vitamin C  500 mg Oral Daily  . feeding supplement (ENSURE ENLIVE)  237 mL Oral BID BM  . feeding supplement (PRO-STAT SUGAR FREE 64)  30 mL Oral Daily  . hydroxyurea  500 mg Oral Daily  . metoprolol tartrate  50 mg Oral TID  . rosuvastatin  40 mg Oral q1800  . sodium chloride flush  3 mL Intravenous Q12H  . zinc sulfate  220 mg Oral Daily   Continuous Infusions: . sodium chloride     PRN Meds: sodium chloride, ALPRAZolam, sodium chloride flush   Vital Signs    Vitals:   01/05/20 2111 01/06/20 0500 01/06/20 0511 01/06/20 0738  BP: (!) 115/91  117/70   Pulse:   93   Resp: 19 16 18 18   Temp: 97.7 F (36.5 C)  (!) 97.5 F (36.4 C)   TempSrc: Oral  Oral   SpO2:   93%   Weight:   109.5 kg   Height:        Intake/Output Summary (Last 24 hours) at 01/06/2020 0846 Last data filed at 01/05/2020 2204 Gross per 24 hour  Intake 53 ml  Output 300 ml  Net -247 ml   Last 3 Weights 01/06/2020 01/02/2020 12/11/2019  Weight (lbs) 241 lb 4.8 oz 253 lb 12 oz 253 lb 12.8 oz  Weight (kg) 109.453 kg 115.1 kg 115.123 kg      Telemetry    Atrial fib rates remain 90s-140s - Personally Reviewed  ECG    3/6 atrial fib 108bpm nonspecific changes - Personally Reviewed  Physical Exam   VITAL SIGNS:  reviewed  General - calm male in no acute distress Pulm - No labored  breathing, no coughing during visit, no audible wheezing, speaking in full sentences Neuro - A+Ox3, no slurred speech, answers questions appropriately Psych - Pleasant affect  Labs    Chemistry Recent Labs  Lab 01/04/20 0232 01/05/20 0232 01/06/20 0326  NA 141 142 140  K 3.6 3.7 4.0  CL 106 106 107  CO2 27 26 23   GLUCOSE 117* 97 83  BUN 21 23 21   CREATININE 0.79 0.97 0.85  CALCIUM 8.2* 8.2* 8.3*  PROT 5.1* 5.3* 5.4*  ALBUMIN 1.8* 2.0* 2.1*  AST 71* 123* 66*  ALT 45* 70* 58*  ALKPHOS 66 72 71  BILITOT 0.6 0.9 0.6  GFRNONAA >60 >60 >60  GFRAA >60 >60 >60  ANIONGAP 8 10 10      Hematology Recent Labs  Lab 01/04/20 0232 01/05/20 0232 01/06/20 0326  WBC 19.0* 18.1* 18.3*  RBC 5.85* 6.28* 6.44*  HGB 13.8 14.9 15.2  HCT 46.0 50.5 52.4*  MCV 78.6* 80.4 81.4  MCH 23.6* 23.7* 23.6*  MCHC 30.0 29.5* 29.0*  RDW 19.9* 20.5* 21.2*  PLT 405* 508* 532*    DDimer  Recent Labs  Lab 01/04/20 0232 01/05/20 0232 01/06/20 0326  DDIMER 4.61* 3.40* 2.67*  Radiology    No results found.  Cardiac Studies   2D Echo 01/02/20 1. Left ventricular ejection fraction, by estimation, is 60 to 65%. The  left ventricle has normal function. The left ventricle demonstrates global  hypokinesis. There is mild left ventricular hypertrophy. Tissue doppler  not performed.  2. The right ventricular size is normal.  3. Left atrial size was mild to moderately dilated.  4. Right atrial size was mildly dilated.  5. The mitral valve is grossly normal. Trivial mitral valve  regurgitation.  6. The aortic valve is tricuspid. Aortic valve regurgitation is not  visualized. Mild aortic valve sclerosis is present, with no evidence of  aortic valve stenosis.  7. Limited images due to poor acoustic windows  Patient Profile     74 y.o. 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 Last seen in office 12/2019 with vague exertional  symptoms with plan for nuclear stress test. OP nuc was planned but was admitted with Covid in the meantime. He presented to [facility] with SOB felt secondary to viral PNA from Covid-19. As part of workup, had elevated troponins of 1.5 at [place] and 1108 here. His pattern of ST segment elevation raised the possibility of myopericarditis. CTA was negative for PE. During this admission has been noted to go into atrial fibrillation. CHADSVASC 3 so was started on apixaban. Blood pressure has been soft.  Assessment & Plan    1. Elevated troponin in setting of Covid-19 infection with prior history of CAD - this could represent demand ischemia in setting of Covid 19 PNA, vs progression of underlying CAD, vs underlying myopericarditis related to Covid. CTA 01/02/20 negative for PE. Per Dr. Antionette Char note (primary cardiologist), he recommended to consider non-urgent cath as outpatient once recovered from Covid given downtrending troponins, no current chest pain, and normal LV function without WMA. I sent a message to the office to cancel his previously arranged stress test. He was on heparin initially and there was a plan to add Plavix. However, since that time he's been found to go into atrial fib as well and is now on apixaban. MD to clarify whether he should be on ASA + apixaban for the time being (given elevated troponin) or just apixaban. I will also ask for clarification on his echo report - indicates normal LV function but also references global hypokinesis.  2. Newly diagnosed atrial fibrillation - Rate still challenging. Metoprolol titrated to 50mg  TID yesterday. Atrial size is moderately dilated. Will discuss further med addition with MD, likely will be limited by tendency for softer BP. The hope would be that tendency towards atrial fib/tachycardia would subside as Covid-19 infection resolves but need to follow closely. TEE/DCCV less ideal as we have seen reversion back to AF soon after given ongoing  Covid infection. Received IV Lasix yesterday, UOP only recorded as 300 but weight listed as down 12lb from first weight on 3/5. Will review with Dr. Marlou Porch.  3. Dyspnea - will add BNP onto labs, repeat EKG and check portable CXR. Hard to know what's Covid and what's cardiac. Addendum: EKG shows atrial fib 80bpm no acute changes.  4. Essential hypertension - BP remains low-normal.  5. Hyperlipidemia - continue statin.  6. Covid-19 infection - being treated by IM. Abnormal LFTs noted, low albumin, leukocytosis. Also with incidental finding of 77mm ground glass opacity in LUL, will need OP follow-up for this.  7. Abnormal TSH - elevated  FT4 but also elevated TSH as well. ? Sick euthyroid. Appreciate IM input.  For questions or updates, please contact [place] Please consult [email or web address] for contact info under        Signed, Charlie Pitter, PA-C  01/06/2020, 8:46 AM     Personally seen and examined. Agree with above.   Sitting up on edge of bed, taking his medications.  No significant chest pain or shortness of breath at rest.  X-ray seems to be improving.  Telemetry personally reviewed demonstrates atrial fibrillation with rapid ventricular response.  Somewhat challenging to rate control.  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  Still challenging to rate control.   We will increase this to 4 times a day -I will go ahead and add diltiazem 30 mg 4 times a day as well.  Watch blood pressure.  Candee Furbish, MD

## 2020-01-06 NOTE — Progress Notes (Signed)
ANTICOAGULATION CONSULT NOTE - Follow Up Consult  Pharmacy Consult for Eliquis Indication: atrial fibrillation  Allergies  Allergen Reactions  . Zocor [Simvastatin]     Made fingers numb    Patient Measurements: Height: 5\' 11"  (180.3 cm) Weight: 241 lb 4.8 oz (109.5 kg) IBW/kg (Calculated) : 75.3  Vital Signs: Temp: 97.5 F (36.4 C) (03/09 0511) Temp Source: Oral (03/09 0511) BP: 117/70 (03/09 0511) Pulse Rate: 93 (03/09 0511)  Labs: Recent Labs    01/04/20 0232 01/04/20 0232 01/05/20 0232 01/06/20 0326  HGB 13.8   < > 14.9 15.2  HCT 46.0  --  50.5 52.4*  PLT 405*  --  508* 532*  CREATININE 0.79  --  0.97 0.85   < > = values in this interval not displayed.    Estimated Creatinine Clearance: 96 mL/min (by C-G formula based on SCr of 0.85 mg/dL).   Assessment: Anticoag: heparin ACS > apixa for Afib. Age <80, wt >60, Scr <1.5. CBC WNL - Trop 1108 > 282.Per cards trops elevated due to COVID - 3/5 CTA: no definite evidence of PE - CHADS2VASC: 4  Goal of Therapy:  Therapeutic oral anticoagulation   Plan:  Cont apixaban 5mg  twice daily  Pharmacy will sign off. Please reconsult for further dosing assitance. Will follow DOAC peripherally   Jaquavius Hudler S. Alford Highland, PharmD, BCPS Clinical Staff Pharmacist [email or web address] Alford Highland, [employer] 01/06/2020,10:11 AM

## 2020-01-06 NOTE — Progress Notes (Signed)
PROGRESS NOTE                                                                                                                                                                                                             Patient Demographics:    Leonard Washington, is a 74 y.o. male, DOB - 09-Feb-1946, [id number]  Outpatient Primary MD for the patient is [name], MD   Admit date - 01/02/2020   LOS - 4  No chief complaint on file.      Brief Narrative: Patient is a 74 y.o. male with PMHx of CAD s/p CABG, polycythemia rubra-who was transferred from [facility] to [facility] earlier this morning for evaluation of shortness of breath likely secondary to COVID-19 pneumonia.  While at [facility] complained of left-sided chest pain-and had elevated troponin levels of around 1.15.  Significant Events: 3/6>> A. fib with RVR 3/5>> transfer from [facility]/admit to [facility] 3/5>> CTA chest-negative for PE  COVID-19 medications: Remdesivir: 3/4>>3/8 Steroids: 3/4>>3/7  Microbiology data: None  Procedures: None  Consults: Cardiology   Subjective:   Anxious about need for continued hospitalization.  Remains in atrial fibrillation this morning.  Heart rate fluctuating-but with minimal ambulation goes up to the 130s-140s.   Assessment  & Plan :   Covid 19 Viral pneumonia: Improved-on room air for the past several days.  Has completed a course of steroids/remdesivir.   Note-CTA chest on 3/5 - for PE.  Fever: afebrile  O2 requirements:  SpO2: 99 %   COVID-19 Labs: Recent Labs    01/04/20 0232 01/05/20 0232 01/06/20 0326  DDIMER 4.61* 3.40* 2.67*  FERRITIN 164 132  --   CRP 7.8* 3.7* 1.6*       Component Value Date/Time   BNP 300.1 (H) 01/06/2020 0909    Recent Labs  Lab 01/02/20 0532  PROCALCITON 0.34    No results found for: SARSCOV2NAA   Prone/Incentive Spirometry: encouraged  incentive spirometry  use 3-4/hour.  DVT Prophylaxis  :   Heparin infusion  Elevated troponin/chest pain: Thought to be atypical-appreciate cardiology input.  Plans are for further work-up in the outpatient setting.   History of CAD s/p CABG: On beta-blocker and statin.  As patient on Eliquis due to A. fib-no longer on aspirin.  Paroxysmal A. fib with RVR: Although rate overall better with rest-still continues  to have RVR with minimal ambulation.  Remains on beta-blocker-cardiology has added Cardizem.  Preserved EF.  TSH only minimally elevated-stable for outpatient follow-up.   Leukocytosis: Likely secondary to steroids-should improve now that steroids have been discontinued.  No indication of infection-continue to follow  Anxiety: Likely provoked due to A. fib-continue with as needed Xanax.  HTN: BP soft-continue with metoprolol.  HLD: Continue statin  Polycythemia rubra: Continue hydroxyurea-follow hemoglobin/hematocrit.  8 mm ground-glass opacity is noted laterally in the left upper lobe: Incidental finding on CT chest-ex-smoker-defer to outpatient MD-but repeat CT chest in 3 to 6 months.  Obesity: Estimated body mass index is 33.65 kg/m as calculated from the following:   Height as of this encounter: 5\' 11"  (1.803 m).   Weight as of this encounter: 109.5 kg.    ABG: No results found for: PHART, PCO2ART, PO2ART, HCO3, TCO2, ACIDBASEDEF, O2SAT  Vent Settings: N/A  Condition -Guarded  Family Communication  :  Spouse updated over the phone on 3/9  Code Status :  Full Code  Diet :  Diet Order            Diet Heart Room service appropriate? Yes; Fluid consistency: Thin  Diet effective now               Disposition Plan  :  Home when Heart rate better  Barriers to discharge:  A. fib with RVR  Antimicorbials  :    Anti-infectives (From admission, onward)   Start     Dose/Rate Route Frequency Ordered Stop   01/03/20 1000  remdesivir 100 mg in sodium chloride 0.9 % 100 mL IVPB  Status:   Discontinued     100 mg 200 mL/hr over 30 Minutes Intravenous Daily 01/02/20 0430 01/02/20 0432   01/02/20 1000  remdesivir 100 mg in sodium chloride 0.9 % 100 mL IVPB     100 mg 200 mL/hr over 30 Minutes Intravenous Daily 01/02/20 0434 01/05/20 1900   01/02/20 0430  remdesivir 200 mg in sodium chloride 0.9% 250 mL IVPB  Status:  Discontinued     200 mg 580 mL/hr over 30 Minutes Intravenous Once 01/02/20 0430 01/02/20 0432      Inpatient Medications  Scheduled Meds: . apixaban  5 mg Oral BID  . vitamin C  500 mg Oral Daily  . diltiazem  30 mg Oral QID  . feeding supplement (ENSURE ENLIVE)  237 mL Oral BID BM  . feeding supplement (PRO-STAT SUGAR FREE 64)  30 mL Oral Daily  . hydroxyurea  500 mg Oral Daily  . metoprolol tartrate  50 mg Oral QID  . rosuvastatin  40 mg Oral q1800  . sodium chloride flush  3 mL Intravenous Q12H  . zinc sulfate  220 mg Oral Daily   Continuous Infusions: . sodium chloride     PRN Meds:.sodium chloride, ALPRAZolam, sodium chloride flush   Time Spent in minutes  25   See all Orders from today for further details   Oren Binet M.D on 01/06/2020 at 1:25 PM  To page go to [email or web address] - use universal password  [facility] -  Office  [phone]    Objective:   Vitals:   01/06/20 0738 01/06/20 1104 01/06/20 1116 01/06/20 1219  BP:  100/70    Pulse:      Resp: 18 18 20    Temp:    (!) 97.4 F (36.3 C)  TempSrc:    Oral  SpO2:  99%    Weight:  Height:        Wt Readings from Last 3 Encounters:  01/06/20 109.5 kg  12/11/19 115.1 kg  03/11/19 111.7 kg     Intake/Output Summary (Last 24 hours) at 01/06/2020 1325 Last data filed at 01/06/2020 0858 Gross per 24 hour  Intake 413 ml  Output 300 ml  Net 113 ml     Physical Exam Gen Exam:Alert awake-not in any distress HEENT:atraumatic, normocephalic Chest: B/L clear to auscultation anteriorly CVS:S1S2 irregular Abdomen:soft non tender, non distended Extremities:no  edema Neurology: Non focal Skin: no rash   Data Review:    CBC Recent Labs  Lab 01/02/20 0532 01/03/20 0359 01/04/20 0232 01/05/20 0232 01/06/20 0326  WBC 20.1* 18.2* 19.0* 18.1* 18.3*  HGB 12.4* 13.9 13.8 14.9 15.2  HCT 41.1 45.9 46.0 50.5 52.4*  PLT 286 366 405* 508* 532*  MCV 78.6* 78.3* 78.6* 80.4 81.4  MCH 23.7* 23.7* 23.6* 23.7* 23.6*  MCHC 30.2 30.3 30.0 29.5* 29.0*  RDW 19.1* 19.7* 19.9* 20.5* 21.2*  LYMPHSABS 0.8  --   --   --   --   MONOABS 1.4*  --   --   --   --   EOSABS 0.0  --   --   --   --   BASOSABS 0.1  --   --   --   --     Chemistries  Recent Labs  Lab 01/02/20 0532 01/03/20 0359 01/04/20 0232 01/05/20 0232 01/06/20 0326  NA 136 135 141 142 140  K 3.9 3.8 3.6 3.7 4.0  CL 99 101 106 106 107  CO2 27 22 27 26 23   GLUCOSE 122* 128* 117* 97 83  BUN 13 20 21 23 21   CREATININE 1.12 0.79 0.79 0.97 0.85  CALCIUM 8.2* 8.2* 8.2* 8.2* 8.3*  AST 65* 61* 71* 123* 66*  ALT 37 34 45* 70* 58*  ALKPHOS 72 75 66 72 71  BILITOT 1.0 1.2 0.6 0.9 0.6   ------------------------------------------------------------------------------------------------------------------ No results for input(s): CHOL, HDL, LDLCALC, TRIG, CHOLHDL, LDLDIRECT in the last 72 hours.  Lab Results  Component Value Date   HGBA1C 5.0 07/13/2016   ------------------------------------------------------------------------------------------------------------------ Recent Labs    01/05/20 0232  TSH 4.735*   ------------------------------------------------------------------------------------------------------------------ Recent Labs    01/04/20 0232 01/05/20 0232  FERRITIN 164 132    Coagulation profile No results for input(s): INR, PROTIME in the last 168 hours.  Recent Labs    01/05/20 0232 01/06/20 0326  DDIMER 3.40* 2.67*    Cardiac Enzymes No results for input(s): CKMB, TROPONINI, MYOGLOBIN in the last 168 hours.  Invalid input(s):  CK ------------------------------------------------------------------------------------------------------------------    Component Value Date/Time   BNP 300.1 (H) 01/06/2020 0909    Micro Results Recent Results (from the past 240 hour(s))  MRSA PCR Screening     Status: None   Collection Time: 01/02/20  5:45 AM   Specimen: Nasopharyngeal  Result Value Ref Range Status   MRSA by PCR NEGATIVE NEGATIVE Final    Comment:        The GeneXpert MRSA Assay (FDA approved for NASAL specimens only), is one component of a comprehensive MRSA colonization surveillance program. It is not intended to diagnose MRSA infection nor to guide or monitor treatment for MRSA infections. Performed at [facility], [address], [place], [place] [address]     Radiology Reports CT ANGIO CHEST PE W OR WO CONTRAST  Result Date: 01/02/2020 CLINICAL DATA:  Shortness of breath, elevated D-dimer level. EXAM: CT  ANGIOGRAPHY CHEST WITH CONTRAST TECHNIQUE: Multidetector CT imaging of the chest was performed using the standard protocol during bolus administration of intravenous contrast. Multiplanar CT image reconstructions and MIPs were obtained to evaluate the vascular anatomy. CONTRAST:  60 mL OMNIPAQUE IOHEXOL 350 MG/ML SOLN COMPARISON:  December 29, 2019. FINDINGS: Cardiovascular: Satisfactory opacification of the pulmonary arteries to the segmental level. No evidence of pulmonary embolism. Normal heart size. No pericardial effusion. Atherosclerosis of thoracic aorta is noted without aneurysm or dissection. Status post coronary bypass graft. Mediastinum/Nodes: No enlarged mediastinal, hilar, or axillary lymph nodes. Thyroid gland, trachea, and esophagus demonstrate no significant findings. Lungs/Pleura: No pneumothorax or pleural effusion is noted. Mild bibasilar subsegmental atelectasis is noted. 8 mm ground-glass opacity is noted laterally in the left upper lobe best seen on image number 28 of series 7 which was  present on prior exam. Upper Abdomen: No acute abnormality. Musculoskeletal: No chest wall abnormality. No acute or significant osseous findings. Review of the MIP images confirms the above findings. IMPRESSION: 1. No definite evidence of pulmonary embolus. 2. Mild bibasilar subsegmental atelectasis is noted. 3. Status post coronary bypass graft. 4. 8 mm ground-glass opacity is noted laterally in the left upper lobe. Initial follow-up by chest CT without contrast is recommended in 3 months to confirm persistence. This recommendation follows the consensus statement: Recommendations for the Management of Subsolid Pulmonary Nodules Detected at CT: A Statement from the [facility] as published in Radiology 2013; 266:304-317. Aortic Atherosclerosis (ICD10-I70.0). Electronically Signed   By: Marijo Conception M.D.   On: 01/02/2020 10:14   DG CHEST PORT 1 VIEW  Result Date: 01/06/2020 CLINICAL DATA:  Shortness of breath. EXAM: PORTABLE CHEST 1 VIEW COMPARISON:  January 01, 2020. FINDINGS: Stable cardiomediastinal silhouette. No pneumothorax or pleural effusion is noted. Status post coronary bypass graft. Both lungs are clear. The visualized skeletal structures are unremarkable. IMPRESSION: No active disease. Electronically Signed   By: Marijo Conception M.D.   On: 01/06/2020 09:51   ECHOCARDIOGRAM LIMITED  Result Date: 01/02/2020    ECHOCARDIOGRAM LIMITED REPORT   Patient Name:   Leonard Washington Date of Exam: 01/02/2020 Medical Rec #:  [id number]             Height:       71.0 in Accession #:    [id number]            Weight:       253.7 lb Date of Birth:  [birth year]              BSA:          2.333 m Patient Age:    30 years              BP:           121/69 mmHg Patient Gender: M                     HR:           77 bpm. Exam Location:  Inpatient Procedure: Limited Echo, Cardiac Doppler and Limited Color Doppler Indications:    R94.31 Abnormal EKG  History:        Patient has prior history of Echocardiogram  examinations, most                 recent 07/27/2016. Previous Myocardial Infarction,                 Signs/Symptoms:Dyspnea; Risk Factors:Hypertension and  Dyslipidemia. COVID19 Positive.  Sonographer:    Jonelle Sidle Dance Referring Phys: [place]  1. Left ventricular ejection fraction, by estimation, is 60 to 65%. The left ventricle has normal function. The left ventricle demonstrates global hypokinesis. There is mild left ventricular hypertrophy. Tissue doppler not performed.  2. The right ventricular size is normal.  3. Left atrial size was mild to moderately dilated.  4. Right atrial size was mildly dilated.  5. The mitral valve is grossly normal. Trivial mitral valve regurgitation.  6. The aortic valve is tricuspid. Aortic valve regurgitation is not visualized. Mild aortic valve sclerosis is present, with no evidence of aortic valve stenosis.  7. Limited images due to poor acoustic windowsL FINDINGS  Left Ventricle: Left ventricular ejection fraction, by estimation, is 60 to 65%. The left ventricle has normal function. The left ventricle demonstrates global hypokinesis. There is mild left ventricular hypertrophy. Right Ventricle: The right ventricular size is normal. No increase in right ventricular wall thickness. Left Atrium: Left atrial size was mild to moderately dilated. Right Atrium: Right atrial size was mildly dilated. Pericardium: There is no evidence of pericardial effusion. Mitral Valve: The mitral valve is grossly normal. Trivial mitral valve regurgitation. Tricuspid Valve: The tricuspid valve is normal in structure. Tricuspid valve regurgitation is not demonstrated. Aortic Valve: The aortic valve is tricuspid. Aortic valve regurgitation is not visualized. Mild aortic valve sclerosis is present, with no evidence of aortic valve stenosis. There is mild calcification of the aortic valve. Pulmonic Valve: The pulmonic valve was grossly normal. Pulmonic valve  regurgitation is trivial. Aorta: The aortic root and ascending aorta are structurally normal, with no evidence of dilitation. Venous: The inferior vena cava was not well visualized.  LEFT VENTRICLE PLAX 2D LVIDd:         4.50 cm LVIDs:         4.10 cm LV PW:         1.20 cm LV IVS:        1.00 cm LVOT diam:     2.20 cm LV SV:         84 LV SV Index:   36 LVOT Area:     3.80 cm  RIGHT VENTRICLE          IVC RV Basal diam:  2.70 cm  IVC diam: 1.90 cm LEFT ATRIUM             Index       RIGHT ATRIUM           Index LA diam:        4.70 cm 2.01 cm/m  RA Area:     14.70 cm LA Vol (A2C):   60.4 ml 25.89 ml/m RA Volume:   36.50 ml  15.64 ml/m LA Vol (A4C):   52.8 ml 22.63 ml/m LA Biplane Vol: 58.3 ml 24.99 ml/m  AORTIC VALVE LVOT Vmax:   123.00 cm/s LVOT Vmean:  75.000 cm/s LVOT VTI:    0.222 m  AORTA Ao Root diam: 3.90 cm Ao Asc diam:  3.20 cm MITRAL VALVE MV Area (PHT): 3.72 cm    SHUNTS MV Decel Time: 204 msec    Systemic VTI:  0.22 m MV E velocity: 76.60 cm/s  Systemic Diam: 2.20 cm MV A velocity: 72.80 cm/s MV E/A ratio:  1.05 Glori Bickers MD Electronically signed by Glori Bickers MD Signature Date/Time: 01/02/2020/2:30:39 PM    Final

## 2020-01-06 NOTE — TOC Initial Note (Signed)
Transition of Care [facility]) - Initial/Assessment Note    Patient Details  Name: Leonard Washington MRN: [id number] Date of Birth: 1946/02/27  Transition [facility] Care [facility]) CM/SW Contact:    Carles Collet, RN Phone Number: 01/06/2020, 2:25 PM  Clinical Narrative:            Patient admitted from home w wife. Describes himself as independent PTA, drives. Denies barriers to getting medications.  Sent Eliquis card to unit via tube station.  Explain card to patient. No DME needs identified.       Expected Discharge Plan: Home/Self Care Barriers to Discharge: Continued Medical Work up   Patient Goals and CMS Choice        Expected Discharge Plan and Services Expected Discharge Plan: Home/Self Care                                              Prior Living Arrangements/Services   Lives with:: Spouse                   Activities of Daily Living Home Assistive Devices/Equipment: Grab bars in shower ADL Screening (condition at time of admission) Patient's cognitive ability adequate to safely complete daily activities?: Yes Is the patient deaf or have difficulty hearing?: No Does the patient have difficulty seeing, even when wearing glasses/contacts?: No Does the patient have difficulty concentrating, remembering, or making decisions?: Yes(started 2 weeks ago with on set of these sxs) Patient able to express need for assistance with ADLs?: No Does the patient have difficulty dressing or bathing?: No Independently performs ADLs?: Yes (appropriate for developmental age) Does the patient have difficulty walking or climbing stairs?: No Weakness of Legs: None Weakness of Arms/Hands: None  Permission Sought/Granted                  Emotional Assessment              Admission diagnosis:  Non-ST elevation MI (NSTEMI) ([place]) [I21.4] COVID-19 [U07.1] Pneumonia due to COVID-19 virus [U07.1, J12.82] Patient Active Problem List   Diagnosis Date Noted  . Atrial  fibrillation with rapid ventricular response ([place])   . Pneumonia due to COVID-19 virus 01/02/2020  . NSTEMI (non-ST elevated myocardial infarction) ([place]) 01/02/2020  . CAD (coronary artery disease) 01/02/2020   PCP:  Serita Grammes, MD Pharmacy:   [facility] [place], [address] [place] [address] Phone: [phone] Fax: [phone]     Social Determinants of Health (SDOH) Interventions    Readmission Risk Interventions No flowsheet data found.

## 2020-01-06 NOTE — Care Management (Signed)
Benefit check for Eliquis 5mg  BID submitted

## 2020-01-06 NOTE — TOC Benefit Eligibility Note (Signed)
Transition of Care [facility]) Benefit Eligibility Note    Patient Details  Name: Leonard Washington MRN: [id number] Date of Birth: 1946/08/28   Medication/Dose: Arne Cleveland  5 MG BID  Covered?: Yes  Tier: 3 Drug  Prescription Coverage Preferred Pharmacy: [facility] with Person/Company/Phone Number:: ROBERT  @ [employer] # [id number]  Co-Pay: $37.00  Prior Approval: No  Deductible: Unmet       Memory Argue Phone Number: 01/06/2020, 11:38 AM

## 2020-01-07 LAB — COMPREHENSIVE METABOLIC PANEL
ALT: 51 U/L — ABNORMAL HIGH (ref 0–44)
AST: 54 U/L — ABNORMAL HIGH (ref 15–41)
Albumin: 2.6 g/dL — ABNORMAL LOW (ref 3.5–5.0)
Alkaline Phosphatase: 85 U/L (ref 38–126)
Anion gap: 10 (ref 5–15)
BUN: 22 mg/dL (ref 8–23)
CO2: 27 mmol/L (ref 22–32)
Calcium: 8.8 mg/dL — ABNORMAL LOW (ref 8.9–10.3)
Chloride: 102 mmol/L (ref 98–111)
Creatinine, Ser: 0.9 mg/dL (ref 0.61–1.24)
GFR calc Af Amer: >60 mL/min (ref >60)
GFR calc non Af Amer: >60 mL/min (ref >60)
Glucose, Bld: 86 mg/dL (ref 70–99)
Potassium: 4.4 mmol/L (ref 3.5–5.1)
Sodium: 139 mmol/L (ref 135–145)
Total Bilirubin: 1.1 mg/dL (ref 0.3–1.2)
Total Protein: 5.8 g/dL — ABNORMAL LOW (ref 6.5–8.1)

## 2020-01-07 LAB — CBC
HCT: 52.2 % — ABNORMAL HIGH (ref 39.0–52.0)
Hemoglobin: 15.6 g/dL (ref 13.0–17.0)
MCH: 24 pg — ABNORMAL LOW (ref 26.0–34.0)
MCHC: 29.9 g/dL — ABNORMAL LOW (ref 30.0–36.0)
MCV: 80.2 fL (ref 80.0–100.0)
Platelets: 574 10*3/uL — ABNORMAL HIGH (ref 150–400)
RBC: 6.51 MIL/uL — ABNORMAL HIGH (ref 4.22–5.81)
RDW: 21.4 % — ABNORMAL HIGH (ref 11.5–15.5)
WBC: 16.7 10*3/uL — ABNORMAL HIGH (ref 4.0–10.5)
nRBC: 0.2 % (ref 0.0–0.2)

## 2020-01-07 LAB — C-REACTIVE PROTEIN: CRP: 2.7 mg/dL — ABNORMAL HIGH (ref <1.0)

## 2020-01-07 LAB — D-DIMER, QUANTITATIVE: D-Dimer, Quant: 1.85 ug/mL-FEU — ABNORMAL HIGH (ref 0.00–0.50)

## 2020-01-07 MED ORDER — CARDIZEM LA 120 MG PO TB24: 120.0000 mg | ORAL_TABLET | Freq: Every day | ORAL | 0 refills | Status: DC

## 2020-01-07 MED ORDER — APIXABAN 5 MG PO TABS: 5.0000 mg | ORAL_TABLET | Freq: Two times a day (BID) | ORAL | 1 refills | Status: DC

## 2020-01-07 MED ORDER — METOPROLOL TARTRATE 100 MG PO TABS: 100.0000 mg | ORAL_TABLET | Freq: Two times a day (BID) | ORAL | 1 refills | Status: DC

## 2020-01-07 MED ORDER — ENSURE ENLIVE PO LIQD: 237.0000 mL | Freq: Two times a day (BID) | ORAL | 0 refills | Status: DC

## 2020-01-07 MED ORDER — ALPRAZOLAM 0.25 MG PO TABS
0.2500 mg | ORAL_TABLET | ORAL | Status: AC
  Administered 2020-01-07: 0.25 mg via ORAL
  Filled 2020-01-07: qty 1

## 2020-01-07 MED ORDER — DILTIAZEM HCL ER COATED BEADS 120 MG PO CP24: 120.0000 mg | ORAL_CAPSULE | Freq: Every day | ORAL | 1 refills | Status: DC

## 2020-01-07 NOTE — Discharge Summary (Signed)
PATIENT DETAILS Name: Leonard Washington Age: 74 y.o. Sex: male Date of Birth: July 05, 1946 MRN: [id number]. Admitting Physician: Phillips Grout, MD [id number], Anderson Malta, MD  Admit Date: 01/02/2020 Discharge date: 01/07/2020  Recommendations for Outpatient Follow-up:  1. Follow up with PCP in 1-2 weeks 2. Please obtain CMP/CBC in one week 3. Repeat Chest Xray in 4-6 week 4. Please ensure follow-up with cardiology. 5. Has a lung nodule-see below   Admitted From:  Home  Disposition: [place]: Yes  Equipment/Devices: None  Discharge Condition: Stable  CODE STATUS: FULL CODE  Diet recommendation:  Diet Order            Diet - low sodium heart healthy        Diet Heart Room service appropriate? Yes; Fluid consistency: Thin  Diet effective now               Brief Narrative: Patient is a 74 y.o. male with PMHx of CAD s/p CABG, polycythemia rubra-who was transferred from [facility] to [facility] earlier this morning for evaluation of shortness of breath likely secondary to COVID-19 pneumonia.  While at [facility] complained of left-sided chest pain-and had elevated troponin levels of around 1.15.  Hospital course complicated by A. fib with RVR.  Significant Events: 3/6>> A. fib with RVR 3/5>> transfer from [facility]/admit to [facility] 3/5>> CTA chest-negative for PE  COVID-19 medications: Remdesivir: 3/4>>3/8 Steroids: 3/4>>3/7  Microbiology data: None  Procedures: None  Consults: Cardiology  Brief Hospital Course: Covid 19 Viral pneumonia: Improved-on room air for the past several days.  Has completed a course of steroids/remdesivir.   Note-CTA chest on 3/5 - for PE.  COVID-19 Labs:  Recent Labs    01/05/20 0232 01/06/20 0326 01/07/20 0314  DDIMER 3.40* 2.67* 1.85*  FERRITIN 132  --   --   CRP 3.7* 1.6* 2.7*    No results found for: SARSCOV2NAA   Elevated troponin/chest pain: Thought to be atypical-appreciate  cardiology input.  Plans are for further work-up in the outpatient setting.   History of CAD s/p CABG: On beta-blocker and statin.  As patient on Eliquis due to A. fib-no longer on aspirin.  Paroxysmal A. fib with RVR:  Hospital course complicated by RVR-however has spontaneously converted to sinus rhythm since yesterday morning.  Maintaining sinus rhythm this morning as well.  Spoke with Dr. Claris Pong metoprolol to 100 mg twice daily, start long-acting Cardizem 120 mg p.o. daily.  Echo with preserved EF.  TSH only minimally elevated.  Cardiology will arrange outpatient follow-up.    Leukocytosis: Likely secondary to steroids-slowly downtrending.  No indication of any bacterial infection.  Please repeat CBC in 1 week-if leukocytosis persists-consider further work-up.    Anxiety: Likely provoked due to A. fib-treated with as needed Xanax.  HTN: BP controlled with metoprolol and Cardizem.  HLD: Continue statin  Polycythemia rubra: Continue hydroxyurea-follow hemoglobin/hematocrit periodically in the outpatient setting.  8 mm ground-glass opacity is noted laterally in the left upper lobe: Incidental finding on CT chest-ex-smoker-defer to outpatient MD-but repeat CT chest in 3 to 6 months.  Obesity: Estimated body mass index is 33.65 kg/m as calculated from the following:   Height as of this encounter: 5\' 11"  (1.803 m).   Weight as of this encounter: 109.5 kg  Nutrition Problem: Nutrition Problem: Increased nutrient needs Etiology: acute illness(COVID) Signs/Symptoms: estimated needs Interventions: Ensure Enlive (each supplement provides 350kcal and 20 grams of protein)  Obesity: Estimated body mass index is 33.65 kg/m  as calculated from the following:   Height as of this encounter: 5\' 11"  (1.803 m).   Weight as of this encounter: 109.5 kg.    Procedures/Studies: None  Discharge Diagnoses:  Principal Problem:   Pneumonia due to COVID-19 virus Active Problems:    NSTEMI (non-ST elevated myocardial infarction) (HCC)   CAD (coronary artery disease)   Atrial fibrillation with rapid ventricular response [facility])   Discharge Instructions:    Person Under Monitoring Name: Leonard Washington  Location: [address] [place] [address]   Infection Prevention Recommendations for Individuals Confirmed to have, or Being Evaluated for, 2019 Novel Coronavirus (COVID-19) Infection Who Receive Care at Home  Individuals who are confirmed to have, or are being evaluated for, COVID-19 should follow the prevention steps below until a healthcare provider or local or state health department says they can return to normal activities.  Stay home except to get medical care You should restrict activities outside your home, except for getting medical care. Do not go to work, school, or public areas, and do not use public transportation or taxis.  Call ahead before visiting your doctor Before your medical appointment, call the healthcare provider and tell them that you have, or are being evaluated for, COVID-19 infection. This will help the healthcare provider's office take steps to keep other people from getting infected. Ask your healthcare provider to call the local or state health department.  Monitor your symptoms Seek prompt medical attention if your illness is worsening (e.g., difficulty breathing). Before going to your medical appointment, call the healthcare provider and tell them that you have, or are being evaluated for, COVID-19 infection. Ask your healthcare provider to call the local or state health department.  Wear a facemask You should wear a facemask that covers your nose and mouth when you are in the same room with other people and when you visit a healthcare provider. People who live with or visit you should also wear a facemask while they are in the same room with you.  Separate yourself from other people in your home As much as  possible, you should stay in a different room from other people in your home. Also, you should use a separate bathroom, if available.  Avoid sharing household items You should not share dishes, drinking glasses, cups, eating utensils, towels, bedding, or other items with other people in your home. After using these items, you should wash them thoroughly with soap and water.  Cover your coughs and sneezes Cover your mouth and nose with a tissue when you cough or sneeze, or you can cough or sneeze into your sleeve. Throw used tissues in a lined trash can, and immediately wash your hands with soap and water for at least 20 seconds or use an alcohol-based hand rub.  Wash your [facility] your hands often and thoroughly with soap and water for at least 20 seconds. You can use an alcohol-based hand sanitizer if soap and water are not available and if your hands are not visibly dirty. Avoid touching your eyes, nose, and mouth with unwashed hands.   Prevention Steps for Caregivers and Household Members of Individuals Confirmed to have, or Being Evaluated for, COVID-19 Infection Being Cared for in the Home  If you live with, or provide care at home for, a person confirmed to have, or being evaluated for, COVID-19 infection please follow these guidelines to prevent infection:  Follow healthcare provider's instructions Make sure that you understand and can help the  patient follow any healthcare provider instructions for all care.  Provide for the patient's basic needs You should help the patient with basic needs in the home and provide support for getting groceries, prescriptions, and other personal needs.  Monitor the patient's symptoms If they are getting sicker, call his or her medical provider and tell them that the patient has, or is being evaluated for, COVID-19 infection. This will help the healthcare provider's office take steps to keep other people from getting infected. Ask the  healthcare provider to call the local or state health department.  Limit the number of people who have contact with the patient  If possible, have only one caregiver for the patient.  Other household members should stay in another home or place of residence. If this is not possible, they should stay  in another room, or be separated from the patient as much as possible. Use a separate bathroom, if available.  Restrict visitors who do not have an essential need to be in the home.  Keep older adults, very young children, and other sick people away from the patient Keep older adults, very young children, and those who have compromised immune systems or chronic health conditions away from the patient. This includes people with chronic heart, lung, or kidney conditions, diabetes, and cancer.  Ensure good ventilation Make sure that shared spaces in the home have good air flow, such as from an air conditioner or an opened window, weather permitting.  Wash your hands often  Wash your hands often and thoroughly with soap and water for at least 20 seconds. You can use an alcohol based hand sanitizer if soap and water are not available and if your hands are not visibly dirty.  Avoid touching your eyes, nose, and mouth with unwashed hands.  Use disposable paper towels to dry your hands. If not available, use dedicated cloth towels and replace them when they become wet.  Wear a facemask and gloves  Wear a disposable facemask at all times in the room and gloves when you touch or have contact with the patient's blood, body fluids, and/or secretions or excretions, such as sweat, saliva, sputum, nasal mucus, vomit, urine, or feces.  Ensure the mask fits over your nose and mouth tightly, and do not touch it during use.  Throw out disposable facemasks and gloves after using them. Do not reuse.  Wash your hands immediately after removing your facemask and gloves.  If your personal clothing becomes  contaminated, carefully remove clothing and launder. Wash your hands after handling contaminated clothing.  Place all used disposable facemasks, gloves, and other waste in a lined container before disposing them with other household waste.  Remove gloves and wash your hands immediately after handling these items.  Do not share dishes, glasses, or other household items with the patient  Avoid sharing household items. You should not share dishes, drinking glasses, cups, eating utensils, towels, bedding, or other items with a patient who is confirmed to have, or being evaluated for, COVID-19 infection.  After the person uses these items, you should wash them thoroughly with soap and water.  Wash laundry thoroughly  Immediately remove and wash clothes or bedding that have blood, body fluids, and/or secretions or excretions, such as sweat, saliva, sputum, nasal mucus, vomit, urine, or feces, on them.  Wear gloves when handling laundry from the patient.  Read and follow directions on labels of laundry or clothing items and detergent. In general, wash and dry with the warmest temperatures  recommended on the label.  Clean all areas the individual has used often  Clean all touchable surfaces, such as counters, tabletops, doorknobs, bathroom fixtures, toilets, phones, keyboards, tablets, and bedside tables, every day. Also, clean any surfaces that may have blood, body fluids, and/or secretions or excretions on them.  Wear gloves when cleaning surfaces the patient has come in contact with.  Use a diluted bleach solution (e.g., dilute bleach with 1 part bleach and 10 parts water) or a household disinfectant with a label that says EPA-registered for coronaviruses. To make a bleach solution at home, add 1 tablespoon of bleach to 1 quart (4 cups) of water. For a larger supply, add  cup of bleach to 1 gallon (16 cups) of water.  Read labels of cleaning products and follow recommendations provided on  product labels. Labels contain instructions for safe and effective use of the cleaning product including precautions you should take when applying the product, such as wearing gloves or eye protection and making sure you have good ventilation during use of the product.  Remove gloves and wash hands immediately after cleaning.  Monitor yourself for signs and symptoms of illness Caregivers and household members are considered close contacts, should monitor their health, and will be asked to limit movement outside of the home to the extent possible. Follow the monitoring steps for close contacts listed on the symptom monitoring form.   ? If you have additional questions, contact your local health department or call the epidemiologist on call at [phone] (available 24/7). ? This guidance is subject to change. For the most up-to-date guidance from CDC, please refer to their website: YouBlogs.pl    Activity:  As tolerated with Full fall precautions use walker/cane & assistance as needed   Discharge Instructions    Call MD for:  difficulty breathing, headache or visual disturbances   Complete by: As directed    Call MD for:  extreme fatigue   Complete by: As directed    Call MD for:  persistant dizziness or light-headedness   Complete by: As directed    Call MD for:  persistant nausea and vomiting   Complete by: As directed    Diet - low sodium heart healthy   Complete by: As directed    Discharge instructions   Complete by: As directed    1.)  3 weeks of isolation from 3/5  2.)  Incidental finding of 8 mm lung nodule in the left upper lobe of the lung-please ask your primary care practitioner to repeat CT chest in 3 to 6 months.  In some instances-these small nodules can turn cancerous.   Follow with Primary MD  Serita Grammes, MD in 1-2 weeks  Follow-up with cardiology as instructed-office will give you a  call.  Please get a complete blood count and chemistry panel checked by your Primary MD at your next visit, and again as instructed by your Primary MD.  Get Medicines reviewed and adjusted: Please take all your medications with you for your next visit with your Primary MD  Laboratory/radiological data: Please request your Primary MD to go over all hospital tests and procedure/radiological results at the follow up, please ask your Primary MD to get all Hospital records sent to his/her office.  In some cases, they will be blood work, cultures and biopsy results pending at the time of your discharge. Please request that your primary care M.D. follows up on these results.  Also Note the following: If you experience worsening of your  admission symptoms, develop shortness of breath, life threatening emergency, suicidal or homicidal thoughts you must seek medical attention immediately by calling 911 or calling your MD immediately  if symptoms less severe.  You must read complete instructions/literature along with all the possible adverse reactions/side effects for all the Medicines you take and that have been prescribed to you. Take any new Medicines after you have completely understood and accpet all the possible adverse reactions/side effects.   Do not drive when taking Pain medications or sleeping medications (Benzodaizepines)  Do not take more than prescribed Pain, Sleep and Anxiety Medications. It is not advisable to combine anxiety,sleep and pain medications without talking with your primary care practitioner  Special Instructions: If you have smoked or chewed Tobacco  in the last 2 yrs please stop smoking, stop any regular Alcohol  and or any Recreational drug use.  Wear Seat belts while driving.  Please note: You were cared for by a hospitalist during your hospital stay. Once you are discharged, your primary care physician will handle any further medical issues. Please note that NO REFILLS  for any discharge medications will be authorized once you are discharged, as it is imperative that you return to your primary care physician (or establish a relationship with a primary care physician if you do not have one) for your post hospital discharge needs so that they can reassess your need for medications and monitor your lab values.   Increase activity slowly   Complete by: As directed      Allergies as of 01/07/2020      Reactions   Zocor [simvastatin]    Made fingers numb      Medication List    STOP taking these medications   aspirin 81 MG tablet   atenolol 25 MG tablet Commonly known as: TENORMIN     TAKE these medications   apixaban 5 MG Tabs tablet Commonly known as: ELIQUIS Take 1 tablet (5 mg total) by mouth 2 (two) times daily.   Cardizem LA 120 MG 24 hr tablet Generic drug: diltiazem Take 1 tablet (120 mg total) by mouth daily.   feeding supplement (ENSURE ENLIVE) Liqd Take 237 mLs by mouth 2 (two) times daily between meals.   Fish Oil 1000 MG Caps Take 1,200 mg by mouth daily.   hydroxyurea 500 MG capsule Commonly known as: HYDREA Take 500 mg by mouth daily.   metoprolol tartrate 100 MG tablet Commonly known as: LOPRESSOR Take 1 tablet (100 mg total) by mouth 2 (two) times daily.   rosuvastatin 40 MG tablet Commonly known as: CRESTOR TAKE 1 TABLET(40 MG) BY MOUTH DAILY What changed: See the new instructions.   triamcinolone cream 0.5 % Commonly known as: KENALOG Apply 1 application topically as needed (rash).      Follow-up Information    Sherren Mocha, MD Follow up.   Leonard: Cardiology Why: We will contact you for a follow-up appointment in approximately 2-3 wks. Contact information: [id number] [place] [address] [phone]        Serita Grammes, MD. Schedule an appointment as soon as possible for a visit in 1 week(s).   Leonard: Family Medicine Contact information: [place] [address] [phone]          Allergies  Allergen Reactions  . Zocor [Simvastatin]     Made fingers numb     Other Procedures/Studies: CT ANGIO CHEST PE W OR WO CONTRAST  Result Date: 01/02/2020 CLINICAL DATA:  Shortness of breath, elevated D-dimer level. EXAM: CT ANGIOGRAPHY CHEST WITH CONTRAST TECHNIQUE: Multidetector CT imaging of the chest was performed using the standard protocol during bolus administration of intravenous contrast. Multiplanar CT image reconstructions and MIPs were obtained to evaluate the vascular anatomy. CONTRAST:  60 mL OMNIPAQUE IOHEXOL 350 MG/ML SOLN COMPARISON:  December 29, 2019. FINDINGS: Cardiovascular: Satisfactory opacification of the pulmonary arteries to the segmental level. No evidence of pulmonary embolism. Normal heart size. No pericardial effusion. Atherosclerosis of thoracic aorta is noted without aneurysm or dissection. Status post coronary bypass graft. Mediastinum/Nodes: No enlarged mediastinal, hilar, or axillary lymph nodes. Thyroid gland, trachea, and esophagus demonstrate no significant findings. Lungs/Pleura: No pneumothorax or pleural effusion is noted. Mild bibasilar subsegmental atelectasis is noted. 8 mm ground-glass opacity is noted laterally in the left upper lobe best seen on image number 28 of series 7 which was present on prior exam. Upper Abdomen: No acute abnormality. Musculoskeletal: No chest wall abnormality. No acute or significant osseous findings. Review of the MIP images confirms the above findings. IMPRESSION: 1. No definite evidence of pulmonary embolus. 2. Mild bibasilar subsegmental atelectasis is noted. 3. Status post coronary bypass graft. 4. 8 mm ground-glass opacity is noted laterally in the left upper lobe. Initial follow-up by chest CT without contrast is recommended in 3 months to confirm persistence. This recommendation follows the consensus statement: Recommendations for the Management of Subsolid Pulmonary  Nodules Detected at CT: A Statement from the [place] as published in Radiology 2013; 266:304-317. Aortic Atherosclerosis (ICD10-I70.0). Electronically Signed   By: Marijo Conception M.D.   On: 01/02/2020 10:14   DG CHEST PORT 1 VIEW  Result Date: 01/06/2020 CLINICAL DATA:  Shortness of breath. EXAM: PORTABLE CHEST 1 VIEW COMPARISON:  January 01, 2020. FINDINGS: Stable cardiomediastinal silhouette. No pneumothorax or pleural effusion is noted. Status post coronary bypass graft. Both lungs are clear. The visualized skeletal structures are unremarkable. IMPRESSION: No active disease. Electronically Signed   By: Marijo Conception M.D.   On: 01/06/2020 09:51   ECHOCARDIOGRAM LIMITED  Result Date: 01/02/2020    ECHOCARDIOGRAM LIMITED REPORT   Patient Name:   Leonard Washington Date of Exam: 01/02/2020 Medical Rec #:  [id number]             Height:       71.0 in Accession #:    [id number]            Weight:       253.7 lb Date of Birth:  1945/12/12              BSA:          2.333 m Patient Age:    23 years              BP:           121/69 mmHg Patient Gender: M                     HR:           77 bpm. Exam Location:  Inpatient Procedure: Limited Echo, Cardiac Doppler and Limited Color Doppler Indications:    R94.31 Abnormal EKG  History:        Patient has prior history of Echocardiogram examinations, most                 recent 07/27/2016. Previous Myocardial Infarction,  Signs/Symptoms:Dyspnea; Risk Factors:Hypertension and                 Dyslipidemia. COVID19 Positive.  Sonographer:    Jonelle Sidle Dance Referring Phys: [place]  1. Left ventricular ejection fraction, by estimation, is 60 to 65%. The left ventricle has normal function. The left ventricle demonstrates global hypokinesis. There is mild left ventricular hypertrophy. Tissue doppler not performed.  2. The right ventricular size is normal.  3. Left atrial size was mild to moderately dilated.  4. Right atrial size  was mildly dilated.  5. The mitral valve is grossly normal. Trivial mitral valve regurgitation.  6. The aortic valve is tricuspid. Aortic valve regurgitation is not visualized. Mild aortic valve sclerosis is present, with no evidence of aortic valve stenosis.  7. Limited images due to poor acoustic windowsL FINDINGS  Left Ventricle: Left ventricular ejection fraction, by estimation, is 60 to 65%. The left ventricle has normal function. The left ventricle demonstrates global hypokinesis. There is mild left ventricular hypertrophy. Right Ventricle: The right ventricular size is normal. No increase in right ventricular wall thickness. Left Atrium: Left atrial size was mild to moderately dilated. Right Atrium: Right atrial size was mildly dilated. Pericardium: There is no evidence of pericardial effusion. Mitral Valve: The mitral valve is grossly normal. Trivial mitral valve regurgitation. Tricuspid Valve: The tricuspid valve is normal in structure. Tricuspid valve regurgitation is not demonstrated. Aortic Valve: The aortic valve is tricuspid. Aortic valve regurgitation is not visualized. Mild aortic valve sclerosis is present, with no evidence of aortic valve stenosis. There is mild calcification of the aortic valve. Pulmonic Valve: The pulmonic valve was grossly normal. Pulmonic valve regurgitation is trivial. Aorta: The aortic root and ascending aorta are structurally normal, with no evidence of dilitation. Venous: The inferior vena cava was not well visualized.  LEFT VENTRICLE PLAX 2D LVIDd:         4.50 cm LVIDs:         4.10 cm LV PW:         1.20 cm LV IVS:        1.00 cm LVOT diam:     2.20 cm LV SV:         84 LV SV Index:   36 LVOT Area:     3.80 cm  RIGHT VENTRICLE          IVC RV Basal diam:  2.70 cm  IVC diam: 1.90 cm LEFT ATRIUM             Index       RIGHT ATRIUM           Index LA diam:        4.70 cm 2.01 cm/m  RA Area:     14.70 cm LA Vol (A2C):   60.4 ml 25.89 ml/m RA Volume:   36.50 ml  15.64  ml/m LA Vol (A4C):   52.8 ml 22.63 ml/m LA Biplane Vol: 58.3 ml 24.99 ml/m  AORTIC VALVE LVOT Vmax:   123.00 cm/s LVOT Vmean:  75.000 cm/s LVOT VTI:    0.222 m  AORTA Ao Root diam: 3.90 cm Ao Asc diam:  3.20 cm MITRAL VALVE MV Area (PHT): 3.72 cm    SHUNTS MV Decel Time: 204 msec    Systemic VTI:  0.22 m MV E velocity: 76.60 cm/s  Systemic Diam: 2.20 cm MV A velocity: 72.80 cm/s MV E/A ratio:  1.05 Glori Bickers MD Electronically signed by Glori Bickers  MD Signature Date/Time: 01/02/2020/2:30:39 PM    Final      TODAY-DAY OF DISCHARGE:  Subjective:   Leonard Washington today has no headache,no chest abdominal pain,no new weakness tingling or numbness, feels much better wants to go home today.   He in fact has removed his IV line-his telemetry leads-and has already dressed up and is requesting that I discharge him today.  Objective:   Blood pressure 107/71, pulse 69, temperature 97.6 F (36.4 C), resp. rate 19, height 5\' 11"  (1.803 m), weight 109.5 kg, SpO2 100 %.  Intake/Output Summary (Last 24 hours) at 01/07/2020 1013 Last data filed at 01/07/2020 0500 Gross per 24 hour  Intake 600 ml  Output 1050 ml  Net -450 ml   Filed Weights   01/02/20 0236 01/06/20 0511  Weight: 115.1 kg 109.5 kg    Exam: Awake Alert, Oriented *3, No new F.N deficits, Normal affect [place].AT,PERRAL Supple Neck,No JVD, No cervical lymphadenopathy appriciated.  Symmetrical Chest wall movement, Good air movement bilaterally, CTAB RRR,No Gallops,Rubs or new Murmurs, No Parasternal Heave +ve B.Sounds, Abd Soft, Non tender, No organomegaly appriciated, No rebound -guarding or rigidity. No Cyanosis, Clubbing or edema, No new Rash or bruise   PERTINENT RADIOLOGIC STUDIES: CT ANGIO CHEST PE W OR WO CONTRAST  Result Date: 01/02/2020 CLINICAL DATA:  Shortness of breath, elevated D-dimer level. EXAM: CT ANGIOGRAPHY CHEST WITH CONTRAST TECHNIQUE: Multidetector CT imaging of the chest was performed using the standard  protocol during bolus administration of intravenous contrast. Multiplanar CT image reconstructions and MIPs were obtained to evaluate the vascular anatomy. CONTRAST:  60 mL OMNIPAQUE IOHEXOL 350 MG/ML SOLN COMPARISON:  December 29, 2019. FINDINGS: Cardiovascular: Satisfactory opacification of the pulmonary arteries to the segmental level. No evidence of pulmonary embolism. Normal heart size. No pericardial effusion. Atherosclerosis of thoracic aorta is noted without aneurysm or dissection. Status post coronary bypass graft. Mediastinum/Nodes: No enlarged mediastinal, hilar, or axillary lymph nodes. Thyroid gland, trachea, and esophagus demonstrate no significant findings. Lungs/Pleura: No pneumothorax or pleural effusion is noted. Mild bibasilar subsegmental atelectasis is noted. 8 mm ground-glass opacity is noted laterally in the left upper lobe best seen on image number 28 of series 7 which was present on prior exam. Upper Abdomen: No acute abnormality. Musculoskeletal: No chest wall abnormality. No acute or significant osseous findings. Review of the MIP images confirms the above findings. IMPRESSION: 1. No definite evidence of pulmonary embolus. 2. Mild bibasilar subsegmental atelectasis is noted. 3. Status post coronary bypass graft. 4. 8 mm ground-glass opacity is noted laterally in the left upper lobe. Initial follow-up by chest CT without contrast is recommended in 3 months to confirm persistence. This recommendation follows the consensus statement: Recommendations for the Management of Subsolid Pulmonary Nodules Detected at CT: A Statement from the [place] as published in Radiology 2013; 266:304-317. Aortic Atherosclerosis (ICD10-I70.0). Electronically Signed   By: Marijo Conception M.D.   On: 01/02/2020 10:14   DG CHEST PORT 1 VIEW  Result Date: 01/06/2020 CLINICAL DATA:  Shortness of breath. EXAM: PORTABLE CHEST 1 VIEW COMPARISON:  January 01, 2020. FINDINGS: Stable cardiomediastinal silhouette. No  pneumothorax or pleural effusion is noted. Status post coronary bypass graft. Both lungs are clear. The visualized skeletal structures are unremarkable. IMPRESSION: No active disease. Electronically Signed   By: Marijo Conception M.D.   On: 01/06/2020 09:51   ECHOCARDIOGRAM LIMITED  Result Date: 01/02/2020    ECHOCARDIOGRAM LIMITED REPORT   Patient Name:   Leonard Washington Date  of Exam: 01/02/2020 Medical Rec #:  [id number]             Height:       71.0 in Accession #:    [id number]            Weight:       253.7 lb Date of Birth:  01-Sep-1946              BSA:          2.333 m Patient Age:    17 years              BP:           121/69 mmHg Patient Gender: M                     HR:           77 bpm. Exam Location:  Inpatient Procedure: Limited Echo, Cardiac Doppler and Limited Color Doppler Indications:    R94.31 Abnormal EKG  History:        Patient has prior history of Echocardiogram examinations, most                 recent 07/27/2016. Previous Myocardial Infarction,                 Signs/Symptoms:Dyspnea; Risk Factors:Hypertension and                 Dyslipidemia. COVID19 Positive.  Sonographer:    Jonelle Sidle Dance Referring Phys: [place]  1. Left ventricular ejection fraction, by estimation, is 60 to 65%. The left ventricle has normal function. The left ventricle demonstrates global hypokinesis. There is mild left ventricular hypertrophy. Tissue doppler not performed.  2. The right ventricular size is normal.  3. Left atrial size was mild to moderately dilated.  4. Right atrial size was mildly dilated.  5. The mitral valve is grossly normal. Trivial mitral valve regurgitation.  6. The aortic valve is tricuspid. Aortic valve regurgitation is not visualized. Mild aortic valve sclerosis is present, with no evidence of aortic valve stenosis.  7. Limited images due to poor acoustic windowsL FINDINGS  Left Ventricle: Left ventricular ejection fraction, by estimation, is 60 to 65%. The left  ventricle has normal function. The left ventricle demonstrates global hypokinesis. There is mild left ventricular hypertrophy. Right Ventricle: The right ventricular size is normal. No increase in right ventricular wall thickness. Left Atrium: Left atrial size was mild to moderately dilated. Right Atrium: Right atrial size was mildly dilated. Pericardium: There is no evidence of pericardial effusion. Mitral Valve: The mitral valve is grossly normal. Trivial mitral valve regurgitation. Tricuspid Valve: The tricuspid valve is normal in structure. Tricuspid valve regurgitation is not demonstrated. Aortic Valve: The aortic valve is tricuspid. Aortic valve regurgitation is not visualized. Mild aortic valve sclerosis is present, with no evidence of aortic valve stenosis. There is mild calcification of the aortic valve. Pulmonic Valve: The pulmonic valve was grossly normal. Pulmonic valve regurgitation is trivial. Aorta: The aortic root and ascending aorta are structurally normal, with no evidence of dilitation. Venous: The inferior vena cava was not well visualized.  LEFT VENTRICLE PLAX 2D LVIDd:         4.50 cm LVIDs:         4.10 cm LV PW:         1.20 cm LV IVS:        1.00 cm LVOT diam:  2.20 cm LV SV:         84 LV SV Index:   36 LVOT Area:     3.80 cm  RIGHT VENTRICLE          IVC RV Basal diam:  2.70 cm  IVC diam: 1.90 cm LEFT ATRIUM             Index       RIGHT ATRIUM           Index LA diam:        4.70 cm 2.01 cm/m  RA Area:     14.70 cm LA Vol (A2C):   60.4 ml 25.89 ml/m RA Volume:   36.50 ml  15.64 ml/m LA Vol (A4C):   52.8 ml 22.63 ml/m LA Biplane Vol: 58.3 ml 24.99 ml/m  AORTIC VALVE LVOT Vmax:   123.00 cm/s LVOT Vmean:  75.000 cm/s LVOT VTI:    0.222 m  AORTA Ao Root diam: 3.90 cm Ao Asc diam:  3.20 cm MITRAL VALVE MV Area (PHT): 3.72 cm    SHUNTS MV Decel Time: 204 msec    Systemic VTI:  0.22 m MV E velocity: 76.60 cm/s  Systemic Diam: 2.20 cm MV A velocity: 72.80 cm/s MV E/A ratio:  1.05  Glori Bickers MD Electronically signed by Glori Bickers MD Signature Date/Time: 01/02/2020/2:30:39 PM    Final      PERTINENT LAB RESULTS: CBC: Recent Labs    01/06/20 0326 01/07/20 0314  WBC 18.3* 16.7*  HGB 15.2 15.6  HCT 52.4* 52.2*  PLT 532* 574*   CMET CMP     Component Value Date/Time   NA 139 01/07/2020 0314   NA 138 12/11/2019 1054   K 4.4 01/07/2020 0314   CL 102 01/07/2020 0314   CO2 27 01/07/2020 0314   GLUCOSE 86 01/07/2020 0314   BUN 22 01/07/2020 0314   BUN 7 (L) 12/11/2019 1054   CREATININE 0.90 01/07/2020 0314   CREATININE 1.02 07/13/2016 0832   CALCIUM 8.8 (L) 01/07/2020 0314   PROT 5.8 (L) 01/07/2020 0314   PROT 6.6 12/02/2019 0953   ALBUMIN 2.6 (L) 01/07/2020 0314   ALBUMIN 4.5 12/02/2019 0953   AST 54 (H) 01/07/2020 0314   ALT 51 (H) 01/07/2020 0314   ALKPHOS 85 01/07/2020 0314   BILITOT 1.1 01/07/2020 0314   BILITOT 0.6 12/02/2019 0953   GFRNONAA >60 01/07/2020 0314   GFRAA >60 01/07/2020 0314    GFR Estimated Creatinine Clearance: 90.6 mL/min (by C-G formula based on SCr of 0.9 mg/dL). No results for input(s): LIPASE, AMYLASE in the last 72 hours. No results for input(s): CKTOTAL, CKMB, CKMBINDEX, TROPONINI in the last 72 hours. Invalid input(s): POCBNP Recent Labs    01/06/20 0326 01/07/20 0314  DDIMER 2.67* 1.85*   No results for input(s): HGBA1C in the last 72 hours. No results for input(s): CHOL, HDL, LDLCALC, TRIG, CHOLHDL, LDLDIRECT in the last 72 hours. Recent Labs    01/05/20 0232  TSH 4.735*   Recent Labs    01/05/20 0232  FERRITIN 132   Coags: No results for input(s): INR in the last 72 hours.  Invalid input(s): PT Microbiology: Recent Results (from the past 240 hour(s))  MRSA PCR Screening     Status: None   Collection Time: 01/02/20  5:45 AM   Specimen: Nasopharyngeal  Result Value Ref Range Status   MRSA by PCR NEGATIVE NEGATIVE Final    Comment:        The  GeneXpert MRSA Assay (FDA approved for  NASAL specimens only), is one component of a comprehensive MRSA colonization surveillance program. It is not intended to diagnose MRSA infection nor to guide or monitor treatment for MRSA infections. Performed at [facility], [place] [address] [place], [place] [address]     FURTHER DISCHARGE INSTRUCTIONS:  Get Medicines reviewed and adjusted: Please take all your medications with you for your next visit with your Primary MD  Laboratory/radiological data: Please request your Primary MD to go over all hospital tests and procedure/radiological results at the follow up, please ask your Primary MD to get all Hospital records sent to his/her office.  In some cases, they will be blood work, cultures and biopsy results pending at the time of your discharge. Please request that your primary care M.D. goes through all the records of your hospital data and follows up on these results.  Also Note the following: If you experience worsening of your admission symptoms, develop shortness of breath, life threatening emergency, suicidal or homicidal thoughts you must seek medical attention immediately by calling 911 or calling your MD immediately  if symptoms less severe.  You must read complete instructions/literature along with all the possible adverse reactions/side effects for all the Medicines you take and that have been prescribed to you. Take any new Medicines after you have completely understood and accpet all the possible adverse reactions/side effects.   Do not drive when taking Pain medications or sleeping medications (Benzodaizepines)  Do not take more than prescribed Pain, Sleep and Anxiety Medications. It is not advisable to combine anxiety,sleep and pain medications without talking with your primary care practitioner  Special Instructions: If you have smoked or chewed Tobacco  in the last 2 yrs please stop smoking, stop any regular Alcohol  and or any Recreational drug use.  Wear  Seat belts while driving.  Please note: You were cared for by a hospitalist during your hospital stay. Once you are discharged, your primary care physician will handle any further medical issues. Please note that NO REFILLS for any discharge medications will be authorized once you are discharged, as it is imperative that you return to your primary care physician (or establish a relationship with a primary care physician if you do not have one) for your post hospital discharge needs so that they can reassess your need for medications and monitor your lab values.  Total Time spent coordinating discharge including counseling, education and face to face time equals 35 minutes.  SignedOren Binet 01/07/2020 10:13 AM

## 2020-01-07 NOTE — TOC Transition Note (Addendum)
Transition of Care [facility]) - CM/SW Discharge Note   Patient Details  Name: Jakim Mazzola MRN: [id number] Date of Birth: [birth year]  [facility] CM/SW Contact:  Maryclare Labrador, RN Phone Number: 01/07/2020, 11:49 AM   Clinical Narrative:   Pt deemed stable for discharge home today.  Pt informed CM that he is independent from home with his wife.  Pt confirms he has a PCP and CM informed of ongoing copay for Elqiuis.  Pt also confirms that he has the free 30 day benefit form with him at bedside - CM reiterate that form needs to be provide to pharmacy at initial fill for beneift.  Pt denied all barriers with discharge today.    CM contacted pharmacy of choice - Cardizem [place] is not on his insurance formulary  - MD switched med and sent script to pharmacy.  Pt confirmed with attending - pt does not need covid at home program.  NO other CM needs determined - CM signing off  Final next level of care: Home/Self Care Barriers to Discharge: Barriers Resolved   Patient Goals and CMS Choice        Discharge Placement                       Discharge Plan and Services                                     Social Determinants of Health (SDOH) Interventions     Readmission Risk Interventions No flowsheet data found.

## 2020-01-07 NOTE — Care Management Important Message (Signed)
Important Message  Patient Details  Name: Leonard Washington MRN: [id number] Date of Birth: Sep 04, 1946   Medicare Important Message Given:  Yes - Important Message mailed due to current National Emergency  Verbal consent obtained due to current National Emergency  Relationship to patient: Spouse/Significant Other Contact Name: Lorain Whorton Call Date: 01/07/20  Time: 1015 Phone: [phone] Outcome: Spoke with contact Important Message mailed to: Patient address on file    Delorse Lek 01/07/2020, 10:15 AM

## 2020-01-07 NOTE — Discharge Instructions (Signed)
Person Under Monitoring Name: Leonard Washington  Location: [address] [place] [address]   Infection Prevention Recommendations for Individuals Confirmed to have, or Being Evaluated for, 2019 Novel Coronavirus (COVID-19) Infection Who Receive Care at Home  Individuals who are confirmed to have, or are being evaluated for, COVID-19 should follow the prevention steps below until a healthcare provider or local or state health department says they can return to normal activities.  Stay home except to get medical care You should restrict activities outside your home, except for getting medical care. Do not go to work, school, or public areas, and do not use public transportation or taxis.  Call ahead before visiting your doctor Before your medical appointment, call the healthcare provider and tell them that you have, or are being evaluated for, COVID-19 infection. This will help the healthcare provider's office take steps to keep other people from getting infected. Ask your healthcare provider to call the local or state health department.  Monitor your symptoms Seek prompt medical attention if your illness is worsening (e.g., difficulty breathing). Before going to your medical appointment, call the healthcare provider and tell them that you have, or are being evaluated for, COVID-19 infection. Ask your healthcare provider to call the local or state health department.  Wear a facemask You should wear a facemask that covers your nose and mouth when you are in the same room with other people and when you visit a healthcare provider. People who live with or visit you should also wear a facemask while they are in the same room with you.  Separate yourself from other people in your home As much as possible, you should stay in a different room from other people in your home. Also, you should use a separate bathroom, if available.  Avoid sharing household  items You should not share dishes, drinking glasses, cups, eating utensils, towels, bedding, or other items with other people in your home. After using these items, you should wash them thoroughly with soap and water.  Cover your coughs and sneezes Cover your mouth and nose with a tissue when you cough or sneeze, or you can cough or sneeze into your sleeve. Throw used tissues in a lined trash can, and immediately wash your hands with soap and water for at least 20 seconds or use an alcohol-based hand rub.  Wash your [facility] your hands often and thoroughly with soap and water for at least 20 seconds. You can use an alcohol-based hand sanitizer if soap and water are not available and if your hands are not visibly dirty. Avoid touching your eyes, nose, and mouth with unwashed hands.   Prevention Steps for Caregivers and Household Members of Individuals Confirmed to have, or Being Evaluated for, COVID-19 Infection Being Cared for in the Home  If you live with, or provide care at home for, a person confirmed to have, or being evaluated for, COVID-19 infection please follow these guidelines to prevent infection:  Follow healthcare provider's instructions Make sure that you understand and can help the patient follow any healthcare provider instructions for all care.  Provide for the patient's basic needs You should help the patient with basic needs in the home and provide support for getting groceries, prescriptions, and other personal needs.  Monitor the patient's symptoms If they are getting sicker, call his or her medical provider and tell them that the patient has, or is being evaluated for, COVID-19  infection. This will help the healthcare provider's office take steps to keep other people from getting infected. Ask the healthcare provider to call the local or state health department.  Limit the number of people who have contact with the patient  If possible, have only one  caregiver for the patient.  Other household members should stay in another home or place of residence. If this is not possible, they should stay  in another room, or be separated from the patient as much as possible. Use a separate bathroom, if available.  Restrict visitors who do not have an essential need to be in the home.  Keep older adults, very young children, and other sick people away from the patient Keep older adults, very young children, and those who have compromised immune systems or chronic health conditions away from the patient. This includes people with chronic heart, lung, or kidney conditions, diabetes, and cancer.  Ensure good ventilation Make sure that shared spaces in the home have good air flow, such as from an air conditioner or an opened window, weather permitting.  Wash your hands often  Wash your hands often and thoroughly with soap and water for at least 20 seconds. You can use an alcohol based hand sanitizer if soap and water are not available and if your hands are not visibly dirty.  Avoid touching your eyes, nose, and mouth with unwashed hands.  Use disposable paper towels to dry your hands. If not available, use dedicated cloth towels and replace them when they become wet.  Wear a facemask and gloves  Wear a disposable facemask at all times in the room and gloves when you touch or have contact with the patient's blood, body fluids, and/or secretions or excretions, such as sweat, saliva, sputum, nasal mucus, vomit, urine, or feces.  Ensure the mask fits over your nose and mouth tightly, and do not touch it during use.  Throw out disposable facemasks and gloves after using them. Do not reuse.  Wash your hands immediately after removing your facemask and gloves.  If your personal clothing becomes contaminated, carefully remove clothing and launder. Wash your hands after handling contaminated clothing.  Place all used disposable facemasks, gloves, and  other waste in a lined container before disposing them with other household waste.  Remove gloves and wash your hands immediately after handling these items.  Do not share dishes, glasses, or other household items with the patient  Avoid sharing household items. You should not share dishes, drinking glasses, cups, eating utensils, towels, bedding, or other items with a patient who is confirmed to have, or being evaluated for, COVID-19 infection.  After the person uses these items, you should wash them thoroughly with soap and water.  Wash laundry thoroughly  Immediately remove and wash clothes or bedding that have blood, body fluids, and/or secretions or excretions, such as sweat, saliva, sputum, nasal mucus, vomit, urine, or feces, on them.  Wear gloves when handling laundry from the patient.  Read and follow directions on labels of laundry or clothing items and detergent. In general, wash and dry with the warmest temperatures recommended on the label.  Clean all areas the individual has used often  Clean all touchable surfaces, such as counters, tabletops, doorknobs, bathroom fixtures, toilets, phones, keyboards, tablets, and bedside tables, every day. Also, clean any surfaces that may have blood, body fluids, and/or secretions or excretions on them.  Wear gloves when cleaning surfaces the patient has come in contact with.  Use a diluted  bleach solution (e.g., dilute bleach with 1 part bleach and 10 parts water) or a household disinfectant with a label that says EPA-registered for coronaviruses. To make a bleach solution at home, add 1 tablespoon of bleach to 1 quart (4 cups) of water. For a larger supply, add  cup of bleach to 1 gallon (16 cups) of water.  Read labels of cleaning products and follow recommendations provided on product labels. Labels contain instructions for safe and effective use of the cleaning product including precautions you should take when applying the product,  such as wearing gloves or eye protection and making sure you have good ventilation during use of the product.  Remove gloves and wash hands immediately after cleaning.  Monitor yourself for signs and symptoms of illness Caregivers and household members are considered close contacts, should monitor their health, and will be asked to limit movement outside of the home to the extent possible. Follow the monitoring steps for close contacts listed on the symptom monitoring form.   ? If you have additional questions, contact your local health department or call the epidemiologist on call at [phone] (available 24/7). ? This guidance is subject to change. For the most up-to-date guidance from [facility], please refer to their website: http://wilson-mayo.com/ on my medicine - ELIQUIS (apixaban)  This medication education was reviewed with me or my healthcare representative as part of my discharge preparation.  The pharmacist that spoke with me during my hospital stay was:  Henri Medal, [facility]  Why was Eliquis prescribed for you? Eliquis was prescribed for you to reduce the risk of a blood clot forming that can cause a stroke if you have a medical condition called atrial fibrillation (a type of irregular heartbeat).  What do You need to know about Eliquis ? Take your Eliquis TWICE DAILY - one tablet in the morning and one tablet in the evening with or without food. If you have difficulty swallowing the tablet whole please discuss with your pharmacist how to take the medication safely.  Take Eliquis exactly as prescribed by your doctor and DO NOT stop taking Eliquis without talking to the doctor who prescribed the medication.  Stopping may increase your risk of developing a stroke.  Refill your prescription before you run out.  After discharge, you should have regular check-up appointments with your healthcare provider that is prescribing  your Eliquis.  In the future your dose may need to be changed if your kidney function or weight changes by a significant amount or as you get older.  What do you do if you miss a dose? If you miss a dose, take it as soon as you remember on the same day and resume taking twice daily.  Do not take more than one dose of ELIQUIS at the same time to make up a missed dose.  Important Safety Information A possible side effect of Eliquis is bleeding. You should call your healthcare provider right away if you experience any of the following: ? Bleeding from an injury or your nose that does not stop. ? Unusual colored urine (red or dark brown) or unusual colored stools (red or black). ? Unusual bruising for unknown reasons. ? A serious fall or if you hit your head (even if there is no bleeding).  Some medicines may interact with Eliquis and might increase your risk of bleeding or clotting while on Eliquis. To help avoid this, consult your healthcare provider or pharmacist prior to using any new prescription or non-prescription medications, including  herbals, vitamins, non-steroidal anti-inflammatory drugs (NSAIDs) and supplements.  This website has more information on Eliquis (apixaban): http://www.eliquis.com/eliquis/home

## 2020-01-07 NOTE — Progress Notes (Signed)
Leonard Washington to be D/C'd Home per MD order.  Discussed with the patient and all questions fully answered.  VSS, Skin clean, dry and intact without evidence of skin break down, no evidence of skin tears noted. IV catheter discontinued intact. Site without signs and symptoms of complications. Dressing and pressure applied.  An After Visit Summary was printed and given to the patient. Patient received prescription.  D/c education completed with patient/family including follow up instructions, medication list, d/c activities limitations if indicated, with other d/c instructions as indicated by MD - patient able to verbalize understanding, all questions fully answered.   Patient instructed to return to ED, call 911, or call MD for any changes in condition.   Patient escorted via [place], and D/C home via private auto.   Leonard Washington [facility] 01/07/2020 11:54 AM

## 2020-01-07 NOTE — Evaluation (Signed)
Physical Therapy Evaluation Patient Details Name: Leonard Washington MRN: [id number] DOB: Sep 09, 1946 Today's Date: 01/07/2020   History of Present Illness  74 year old male transferred from [facility] for SOB, +COVID PNA. Cardiology consult 3/5 for L sided chest pain and elevated troponin. CTA negative for PE. CRP elevated. D-dimer elevated. Cardiac cath to evaluate coronary arteries and grafts deferred until patient is recovered from [place]. Treated with IV heparin, transitioned to subcu heparin but went into Afib and now on Apixaban. He has completed course of steroids/remdesivir. CT chest incidental finding 62mm ground-glass opacity in left upper lobe. PMH: dementia, hypertension, coronary artery disease status post CABG     Clinical Impression  Patient independent with mobility. He was able to stand and perform static and dynamic balance tasks (picking up items from bed, reaching outside BOS) without assistance or LOB. Oxygen saturation 96% during ambulation on room air. No PT needs identified at this time.    Follow Up Recommendations No PT follow up    Equipment Recommendations  None recommended by PT       Precautions / Restrictions Precautions Precautions: None      Mobility  Bed Mobility Overal bed mobility: Modified Independent             General bed mobility comments: sit>supine modI with HOB elevated approx 30 degrees  Transfers Overall transfer level: Independent Equipment used: None             General transfer comment: sit<>stand from recliner chair x 2 trials  Ambulation/Gait Ambulation/Gait assistance: Independent Gait Distance (Feet): 100 Feet Assistive device: None Gait Pattern/deviations: WFL(Within Functional Limits);Step-through pattern     General Gait Details: Oxygen saturation 96%, HR 87 bpm during ambulation on room air  Stairs Stairs: (step not available to assess stair negotiation) Step unavailable at this time to practice  stair negotiation. Patient denies having any concerns about his mobility for discharge home. Patient demonstrates the strength and mobility necessary to negotiate stairs using a railing.    Balance Overall balance assessment: No apparent balance deficits (not formally assessed)       Pertinent Vitals/Pain Pain Assessment: No/denies pain    Home Living Family/patient expects to be discharged to:: Private residence Living Arrangements: Spouse/significant other   Type of Home: House Home Access: Stairs to enter Entrance Stairs-Rails: None(can hold doorway) Entrance Stairs-Number of Steps: 1 Home Layout: One level;Laundry or work area in Dealer to basement with laundry, with rail) Home Equipment: Environmental consultant - 4 wheels;Cane - single point;Grab bars - tub/shower;Shower seat;Bedside commode Additional Comments: owns SPC, rollator, BSC GBs in shower, shower seat    Prior Function Level of Independence: Independent         Comments: walk in shower, independent PTA with mobility, ADLs, IADLs, +driver        Extremity/Trunk Assessment        Lower Extremity Assessment Lower Extremity Assessment: (BLE strength grossly 4+/5)       Communication   Communication: No difficulties  Cognition Arousal/Alertness: Awake/alert Behavior During Therapy: WFL for tasks assessed/performed Overall Cognitive Status: Within Functional Limits for tasks assessed    General Comments General comments (skin integrity, edema, etc.): Patient denies having any concerns about his mobility for discharge home.        Assessment/Plan    PT Assessment Patent does not need any further PT services         PT Goals (Current goals can be found in the Care Plan section)  Acute Rehab  PT Goals Patient Stated Goal: to go home               AM-PAC PT "6 Clicks" Mobility  Outcome Measure Help needed turning from your back to your side while in a flat bed without using bedrails?:  None Help needed moving from lying on your back to sitting on the side of a flat bed without using bedrails?: None Help needed moving to and from a bed to a chair (including a wheelchair)?: None Help needed standing up from a chair using your arms (e.g., wheelchair or bedside chair)?: None Help needed to walk in hospital room?: None Help needed climbing 3-5 steps with a railing? : None 6 Click Score: 24    End of Session   Activity Tolerance: Patient tolerated treatment well Patient left: in bed;with call bell/phone within reach Nurse Communication: Mobility status      Time: [id number] PT Time Calculation (min) (ACUTE ONLY): 12 min   Charges:   PT Evaluation $PT Eval Low Complexity: [place], DPT, PT Acute Rehab [phone] office    Birdie Hopes 01/07/2020, 10:45 AM

## 2020-01-08 ENCOUNTER — Telehealth: Payer: Self-pay | Admitting: Cardiovascular Disease

## 2020-01-08 NOTE — Telephone Encounter (Signed)
Confirmed with patient's wife he is asymptomatic from [place] now. Scheduled the patient 3/26 for evaluation with Dr. Burt Knack. She was grateful for assistance.

## 2020-01-08 NOTE — Telephone Encounter (Signed)
  Pt's wife calling, she said pt just discharged from hospital and wanted to reschedule appt with PA on 02/04/20, pt wanted to see Dr. Burt Knack instead of PA. Offered May appt they declined and ask callback from Dr. Antionette Char nurse  Please call

## 2020-01-09 LAB — ECHOCARDIOGRAM LIMITED
Height: 71 in
Weight: 4059.99 oz

## 2020-01-20 DIAGNOSIS — Z1331 Encounter for screening for depression: Secondary | ICD-10-CM | POA: Diagnosis not present

## 2020-01-20 DIAGNOSIS — Z7689 Persons encountering health services in other specified circumstances: Secondary | ICD-10-CM | POA: Diagnosis not present

## 2020-01-20 DIAGNOSIS — C61 Malignant neoplasm of prostate: Secondary | ICD-10-CM | POA: Diagnosis not present

## 2020-01-20 DIAGNOSIS — F5102 Adjustment insomnia: Secondary | ICD-10-CM | POA: Diagnosis not present

## 2020-01-21 ENCOUNTER — Other Ambulatory Visit (HOSPITAL_COMMUNITY): Payer: Self-pay | Admitting: Urology

## 2020-01-21 ENCOUNTER — Telehealth (HOSPITAL_COMMUNITY): Payer: Self-pay | Admitting: Cardiovascular Disease

## 2020-01-21 ENCOUNTER — Other Ambulatory Visit: Payer: Self-pay | Admitting: Urology

## 2020-01-21 DIAGNOSIS — C61 Malignant neoplasm of prostate: Secondary | ICD-10-CM

## 2020-01-21 NOTE — Telephone Encounter (Signed)
error 

## 2020-01-22 ENCOUNTER — Telehealth: Payer: Self-pay | Admitting: Cardiovascular Disease

## 2020-01-22 NOTE — Telephone Encounter (Signed)
Patient would like his wife to be present during his appointment tomorrow with Dr. Burt Knack. The patient admits that he doesn't always have the best memory, and when he gets home he gets quizzed by his wife and he can't always remember what Dr. Burt Knack says.  Please let the patient know what the office decides

## 2020-01-22 NOTE — Telephone Encounter (Signed)
The patient understands the visitors policy and agrees to have his wife on speaker phone during the visit. He was grateful for call and agrees with plan.

## 2020-01-23 ENCOUNTER — Other Ambulatory Visit: Payer: Self-pay

## 2020-01-23 ENCOUNTER — Encounter: Payer: Self-pay | Admitting: Cardiovascular Disease

## 2020-01-23 ENCOUNTER — Ambulatory Visit: Payer: Medicare Other | Admitting: Cardiovascular Disease

## 2020-01-23 VITALS — BP 108/64 | HR 66 | Resp 15 | Ht 71.0 in | Wt 240.6 lb

## 2020-01-23 DIAGNOSIS — I480 Paroxysmal atrial fibrillation: Secondary | ICD-10-CM

## 2020-01-23 DIAGNOSIS — I25119 Atherosclerotic heart disease of native coronary artery with unspecified angina pectoris: Secondary | ICD-10-CM

## 2020-01-23 NOTE — Progress Notes (Signed)
Cardiology Office Note:    Date:  01/23/2020   ID:  Leonard Washington, DOB 25-Dec-1945, MRN [id number]  PCP:  Serita Grammes, MD  Cardiologist:  Sherren Mocha, MD  Electrophysiologist:  None   Referring MD: Serita Grammes, MD   Chief Complaint  Patient presents with  . Coronary Artery Disease    History of Present Illness:    Leonard Washington is a 74 y.o. male with a hx of coronary artery disease and atrial fibrillation, presenting for follow-up evaluation.  The patient underwent four-vessel CABG in 2005.  His graft anatomy includes a LIMA to LAD, left radial to RCA, saphenous vein graft to ramus intermedius, and saphenous vein graft to left circumflex marginal.  The patient has really done well until he recently was hospitalized with COVID-19 infection.  He was noted to have atrial fibrillation and an elevated high-sensitivity troponin up to about 1000.  He did have some associated chest discomfort with this.  He was treated conservatively and did very well.  LV function was noted to be normal.  He was started on apixaban for anticoagulation.  He is having a lot of bruising but otherwise has no specific complaints.  He does not think he has had any more atrial fibrillation.  He did convert spontaneously during his hospitalization back to sinus rhythm.  He currently feels well and denies chest pain, shortness of breath, leg swelling, or recurrent heart palpitations.  Past Medical History:  Diagnosis Date  . BPH (benign prostatic hyperplasia)   . Cataract 2016   Had Bil surgery  . Chest congestion 11/29/2018  . Coronary atherosclerosis of native coronary artery    Vessel CABG 2005  . Hyperlipidemia   . Hypertension   . Myocardial infarction [facility]) 1997   Had stent put in 197/CABG in2005  . Shoulder pain    right  side  . SOB (shortness of breath) on exertion     Past Surgical History:  Procedure Laterality Date  . ARTERIAL DUPLEX  04/19/2004   Carotid-no evidence of  significant plaque noted, no evidence of significant ICA stenosis, artery flow is antegrade. Upper extremities-within normal limits. Lower extremities-no evidence of lower extremity arterial occlusive disease at rest  . BLADDER SURGERY     had a growth in bladder/benign  . CARDIAC CATHETERIZATION  04/19/2004   severe four vessel coronary disease, continue vigorous medical therapy, recommendations for multivessel bypass grafting  . CARDIOVASCULAR STRESS TEST  07/01/2009   normal pattern of perfusion in all regions, no scintigraphic evidence of inducible myocardial ischemia,EKG negative for ischemia, no ECG changes,  . CATARACT EXTRACTION, BILATERAL    . CORONARY ARTERY BYPASS GRAFT  04/20/2004   x4. LIMA to LAD, left radial artery to RCA, SVG to ramus intermedius, SVG to circ.  Marland Kitchen DOPPLER ECHOCARDIOGRAPHY  11/11/2012   EF 55-60%, wall motion normal, no regional wall motion abnormalities, thickness of ventricular septum was mildly increased, mild regurg of the mitral valve    Current Medications: Current Meds  Medication Sig  . apixaban (ELIQUIS) 5 MG TABS tablet Take 1 tablet (5 mg total) by mouth 2 (two) times daily.  Marland Kitchen diltiazem (CARDIZEM CD) 120 MG 24 hr capsule Take 1 capsule (120 mg total) by mouth daily.  . hydroxyurea (HYDREA) 500 MG capsule Take 500 mg by mouth daily.   . metoprolol tartrate (LOPRESSOR) 100 MG tablet Take 1 tablet (100 mg total) by mouth 2 (two) times daily.  . rosuvastatin (CRESTOR) 40 MG tablet TAKE 1 TABLET(40  MG) BY MOUTH DAILY (Patient taking differently: Take 40 mg by mouth daily. )  . traZODone (DESYREL) 50 MG tablet Take 50 mg by mouth at bedtime.  . triamcinolone cream (KENALOG) 0.5 % Apply 1 application topically as needed (rash).      Allergies:   Zocor [simvastatin]   Social History   Socioeconomic History  . Marital status: Married    Spouse name: Not on file  . Number of children: Not on file  . Years of education: Not on file  . Highest education  level: Not on file  Occupational History  . Not on file  Tobacco Use  . Smoking status: Former Smoker    Quit date: 04/17/1979    Years since quitting: 40.7  . Smokeless tobacco: Never Used  Substance and Sexual Activity  . Alcohol use: Yes    Alcohol/week: 12.0 standard drinks    Types: 12 Cans of beer per week  . Drug use: No  . Sexual activity: Not on file  Other Topics Concern  . Not on file  Social History Narrative   The patient is married. He lives at [facility]. Retired since 2012. Remote smoker.   Social Determinants of Health   Financial Resource Strain:   . Difficulty of Paying Living Expenses:   Food Insecurity:   . Worried About Charity fundraiser in the Last Year:   . Arboriculturist in the Last Year:   Transportation Needs:   . Film/video editor (Medical):   Marland Kitchen Lack of Transportation (Non-Medical):   Physical Activity:   . Days of Exercise per Week:   . Minutes of Exercise per Session:   Stress:   . Feeling of Stress :   Social Connections:   . Frequency of Communication with Friends and Family:   . Frequency of Social Gatherings with Friends and Family:   . Attends Religious Services:   . Active Member of Clubs or Organizations:   . Attends Archivist Meetings:   Marland Kitchen Marital Status:      Family History: The patient's family history includes Colon cancer in his maternal uncle; Heart attack in his maternal grandfather, maternal uncle, maternal uncle, and maternal uncle; Heart disease in his sister.  ROS:   Please see the history of present illness.    All other systems reviewed and are negative.  EKGs/Labs/Other Studies Reviewed:    The following studies were reviewed today: Echo 01-02-2020: IMPRESSIONS    1. Left ventricular ejection fraction, by estimation, is 60 to 65%. The  left ventricle has normal function. The left ventricle has no regional  wall motion abnormalities. There is mild left ventricular hypertrophy.  Tissue  doppler not performed.  2. The right ventricular size is normal.  3. Left atrial size was mild to moderately dilated.  4. Right atrial size was mildly dilated.  5. The mitral valve is grossly normal. Trivial mitral valve  regurgitation.  6. The aortic valve is tricuspid. Aortic valve regurgitation is not  visualized. Mild aortic valve sclerosis is present, with no evidence of  aortic valve stenosis.  7. Limited images due to poor acoustic windowsL   CTA Angio Chest 01-02-2020: FINDINGS: Cardiovascular: Satisfactory opacification of the pulmonary arteries to the segmental level. No evidence of pulmonary embolism. Normal heart size. No pericardial effusion. Atherosclerosis of thoracic aorta is noted without aneurysm or dissection. Status post coronary bypass graft.  Mediastinum/Nodes: No enlarged mediastinal, hilar, or axillary lymph nodes. Thyroid gland, trachea, and  esophagus demonstrate no significant findings.  Lungs/Pleura: No pneumothorax or pleural effusion is noted. Mild bibasilar subsegmental atelectasis is noted. 8 mm ground-glass opacity is noted laterally in the left upper lobe best seen on image number 28 of series 7 which was present on prior exam.  Upper Abdomen: No acute abnormality.  Musculoskeletal: No chest wall abnormality. No acute or significant osseous findings.  Review of the MIP images confirms the above findings.  IMPRESSION: 1. No definite evidence of pulmonary embolus. 2. Mild bibasilar subsegmental atelectasis is noted. 3. Status post coronary bypass graft. 4. 8 mm ground-glass opacity is noted laterally in the left upper lobe. Initial follow-up by chest CT without contrast is recommended in 3 months to confirm persistence. This recommendation follows the consensus statement: Recommendations for the Management of Subsolid Pulmonary Nodules Detected at CT: A Statement from the [place] as published in Radiology 2013;  266:304-317.  EKG:  EKG is not ordered today.    Recent Labs: 01/05/2020: TSH 4.735 01/06/2020: B Natriuretic Peptide 300.1 01/07/2020: ALT 51; BUN 22; Creatinine, Ser 0.90; Hemoglobin 15.6; Platelets 574; Potassium 4.4; Sodium 139  Recent Lipid Panel    Component Value Date/Time   CHOL 121 12/02/2019 0953   TRIG 110 12/02/2019 0953   HDL 44 12/02/2019 0953   CHOLHDL 2.8 12/02/2019 0953   CHOLHDL 3.1 07/13/2016 0832   VLDL 21 07/13/2016 0832   LDLCALC 57 12/02/2019 0953    Physical Exam:    VS:  BP 108/64   Pulse 66   Resp 15   Ht 5\' 11"  (1.803 m)   Wt 240 lb 9.6 oz (109.1 kg)   SpO2 97%   BMI 33.56 kg/m     Wt Readings from Last 3 Encounters:  01/23/20 240 lb 9.6 oz (109.1 kg)  01/06/20 241 lb 4.8 oz (109.5 kg)  12/11/19 253 lb 12.8 oz (115.1 kg)     GEN:  Well nourished, well developed in no acute distress HEENT: Normal NECK: No JVD; No carotid bruits LYMPHATICS: No lymphadenopathy CARDIAC: RRR, no murmurs, rubs, gallops RESPIRATORY:  Clear to auscultation without rales, wheezing or rhonchi  ABDOMEN: Soft, non-tender, non-distended MUSCULOSKELETAL:  No edema; No deformity  SKIN: Warm and dry, scattered ecchymoses NEUROLOGIC:  Alert and oriented x 3 PSYCHIATRIC:  Normal affect   ASSESSMENT:    1. Coronary artery disease involving native heart with angina pectoris, unspecified vessel or lesion type ([place])   2. Paroxysmal atrial fibrillation (HCC)    PLAN:    In order of problems listed above:  1. The patient had recent troponin elevation during COVID-19 infection.  It is not clear whether he had a coronary event or myocarditis or even demand ischemia.  He is currently not having any symptoms at all and he feels well with no active angina.  I have recommended a Lexiscan Myoview stress test for further risk stratification.  He is about 16 years out from multivessel CABG. 2. Maintaining sinus rhythm.  He is anticoagulated with apixaban.  The episode of atrial  fibrillation occurred during his COVID-19 infection and this probably precipitated atrial fibrillation.  The patient has never had this before.  I am inclined to keep him on anticoagulation for at least a few more months.  At that time will check an event monitor and if he remains asymptomatic with no recurrence of atrial fibrillation we might stop his anticoagulation and put him back on antiplatelet therapy.  All of this is discussed with the patient in person today  and his wife over the telephone.   Medication Adjustments/Labs and Tests Ordered: Current medicines are reviewed at length with the patient today.  Concerns regarding medicines are outlined above.  Orders Placed This Encounter  Procedures  . MYOCARDIAL PERFUSION IMAGING   No orders of the defined types were placed in this encounter.   Patient Instructions  Medication Instructions:  1) If you go back into rapid atrial fibrillation, sit down and rest. You may take an extra metoprolol if your heart rate is greater than 120 bpm. *If you need a refill on your cardiac medications before your next appointment, please call your pharmacy*  Testing/Procedures: Your provider has requested that you have a lexiscan myoview. For further information please visit [email or web address]. Please follow instruction sheet, as given.    Your physician has recommended that you wear an event monitor. Event monitors are medical devices that record the heart's electrical activity. Doctors most often [place] these monitors to diagnose arrhythmias. Arrhythmias are problems with the speed or rhythm of the heartbeat. The monitor is a small, portable device. You can wear one while you do your normal daily activities. This is usually used to diagnose what is causing palpitations/syncope (passing out). You will be called in about 2 months to review instructions for the monitor.  Follow-Up: I will schedule your follow-up appointment when I call to arrange your  monitor.    Signed, Sherren Mocha, MD  01/23/2020 5:30 PM    [place]

## 2020-01-23 NOTE — Patient Instructions (Addendum)
Medication Instructions:  1) If you go back into rapid atrial fibrillation, sit down and rest. You may take an extra metoprolol if your heart rate is greater than 120 bpm. *If you need a refill on your cardiac medications before your next appointment, please call your pharmacy*  Testing/Procedures: Your provider has requested that you have a lexiscan myoview. For further information please visit HugeFiesta.[email or web address]. Please follow instruction sheet, as given.    Your physician has recommended that you wear an event monitor. Event monitors are medical devices that record the heart's electrical activity. Doctors most often [place] these monitors to diagnose arrhythmias. Arrhythmias are problems with the speed or rhythm of the heartbeat. The monitor is a small, portable device. You can wear one while you do your normal daily activities. This is usually used to diagnose what is causing palpitations/syncope (passing out). You will be called in about 2 months to review instructions for the monitor.  Follow-Up: I will schedule your follow-up appointment when I call to arrange your monitor.

## 2020-01-26 DIAGNOSIS — C61 Malignant neoplasm of prostate: Secondary | ICD-10-CM | POA: Diagnosis not present

## 2020-01-27 ENCOUNTER — Ambulatory Visit (HOSPITAL_COMMUNITY)
Admission: RE | Admit: 2020-01-27 | Discharge: 2020-01-27 | Disposition: A | Discharge status: Home / Self Care | Payer: Medicare Other | Source: Ambulatory Visit | Attending: Urology | Admitting: Urology

## 2020-01-27 ENCOUNTER — Other Ambulatory Visit: Payer: Self-pay

## 2020-01-27 DIAGNOSIS — C61 Malignant neoplasm of prostate: Secondary | ICD-10-CM | POA: Diagnosis not present

## 2020-01-27 MED ORDER — TECHNETIUM TC 99M MEDRONATE IV KIT
20.0000 | PACK | Freq: Once | INTRAVENOUS | Status: AC | PRN
  Administered 2020-01-27: 18.9 via INTRAVENOUS

## 2020-01-28 ENCOUNTER — Telehealth: Payer: Self-pay | Admitting: Cardiovascular Disease

## 2020-01-28 ENCOUNTER — Telehealth (HOSPITAL_COMMUNITY): Payer: Self-pay

## 2020-01-28 NOTE — Telephone Encounter (Signed)
Reviewed instructions w/ pt who verbalized understanding.  Pt reports that he is VERY claustrophobic, he had to take Valium just to have a bone scan. (Valium 10 mg) I am not sure that pt will need this medication for this study.  Have spoken to nuclear dept who will call pt back today to discuss instructions and camera postioning.  If after discussion they feel pt will need sedative for study they can send message to triage to help w/ order/Rx.  Nuc - If pt does not answer when calling, please leave detailed message with a direct contact number to return call. But I told him to answer if [phone] number calls.

## 2020-01-28 NOTE — Telephone Encounter (Signed)
Patient called and said he has never had one of the stress tests that he is scheduled for and wanted to know what to expect. He would like Katy to call him to explain the procedure in more detail.

## 2020-01-28 NOTE — Telephone Encounter (Signed)
Spoke with the patient, detailed instructions were given and he stated that he would be here for his test. He said he understood and would call back with any questions. S.Naoko Diperna EMTP

## 2020-01-29 ENCOUNTER — Other Ambulatory Visit: Payer: Self-pay

## 2020-01-29 ENCOUNTER — Ambulatory Visit (HOSPITAL_COMMUNITY): Payer: Medicare Other | Attending: Internal Medicine

## 2020-01-29 DIAGNOSIS — I25119 Atherosclerotic heart disease of native coronary artery with unspecified angina pectoris: Secondary | ICD-10-CM

## 2020-01-29 LAB — MYOCARDIAL PERFUSION IMAGING
LV dias vol: 83 mL (ref 62–150)
LV sys vol: 28 mL
Peak HR: 85 {beats}/min
Rest HR: 60 {beats}/min
SDS: 2
SRS: 0
SSS: 2
TID: 0.85

## 2020-01-29 MED ORDER — TECHNETIUM TC 99M TETROFOSMIN IV KIT
31.6000 | PACK | Freq: Once | INTRAVENOUS | Status: AC | PRN
  Administered 2020-01-29: 31.6 via INTRAVENOUS
  Filled 2020-01-29: qty 32

## 2020-01-29 MED ORDER — TECHNETIUM TC 99M TETROFOSMIN IV KIT
10.8000 | PACK | Freq: Once | INTRAVENOUS | Status: AC | PRN
  Administered 2020-01-29: 10.8 via INTRAVENOUS
  Filled 2020-01-29: qty 11

## 2020-01-29 MED ORDER — REGADENOSON 0.4 MG/5ML IV SOLN
0.4000 mg | Freq: Once | INTRAVENOUS | Status: AC
  Administered 2020-01-29: 0.4 mg via INTRAVENOUS

## 2020-01-30 ENCOUNTER — Other Ambulatory Visit: Payer: Self-pay

## 2020-01-30 MED ORDER — METOPROLOL TARTRATE 100 MG PO TABS: 100.0000 mg | ORAL_TABLET | Freq: Two times a day (BID) | ORAL | 3 refills | Status: DC

## 2020-01-30 MED ORDER — DILTIAZEM HCL ER COATED BEADS 120 MG PO CP24: 120.0000 mg | ORAL_CAPSULE | Freq: Every day | ORAL | 3 refills | Status: DC

## 2020-01-30 NOTE — Telephone Encounter (Signed)
Pt's medications were sent to pt's pharmacy as requested. Confirmation received.  

## 2020-01-31 ENCOUNTER — Telehealth: Payer: Self-pay | Admitting: Physician Assistant

## 2020-01-31 NOTE — Telephone Encounter (Signed)
Pt stating his pressure is 102/60, which is about 20 points lower than his baseline prior to his last hospitalization. He was discharged on 100 mg BID lopressor and 120 mg cardizem for new onset atrial fibrillation. He has had three episodes of dizziness upon standing since his discharge. I suggested hydration and to reduce his lopressor by half. I will send a message for a follow up appt with Dr. Antionette Char team.

## 2020-02-01 NOTE — Telephone Encounter (Signed)
Thx Angie

## 2020-02-02 ENCOUNTER — Encounter (HOSPITAL_COMMUNITY): Payer: Medicare Other

## 2020-02-03 NOTE — Telephone Encounter (Signed)
The patient has an appointment with B. Bhagat 4/8.

## 2020-02-04 ENCOUNTER — Ambulatory Visit: Payer: Medicare Other | Admitting: Physician Assistant

## 2020-02-04 DIAGNOSIS — C61 Malignant neoplasm of prostate: Secondary | ICD-10-CM | POA: Diagnosis not present

## 2020-02-04 NOTE — Progress Notes (Signed)
Cardiology Office Note    Date:  02/05/2020   ID:  Leonard Washington, DOB [birth year], MRN [id number]  PCP:  Serita Grammes, MD  Cardiologist:  Dr. Burt Knack   Chief Complaint: 2 weeks follow up  History of Present Illness:   Leonard Washington is a 74 y.o. male with hx of CAD s/p CABG, HTN, HLD, PAF and prostate cancer presents for follow  Up.   Hx of CAD s/p CABG x 4 in 2005. His graft anatomy includes a LIMA to LAD, left radial to RCA, saphenous vein graft to ramus intermedius, and saphenous vein graft to left circumflex marginal.   Admitted 12/2019 with COVID 19.   He was noted to have atrial fibrillation and an elevated high-sensitivity troponin up to about 1000.  He did have some associated chest discomfort with this.  He was treated conservatively and did very well. started on Eliquis for anticoagulation. Echo with normal LVEF.   He was doing well when seen by Dr. Burt Knack 01/23/20. Per note " keep him on anticoagulation for at least a few more months.  At that time will check an event monitor and if he remains asymptomatic with no recurrence of atrial fibrillation we might stop his anticoagulation and put him back on antiplatelet therapy". Low risk stress test 01/29/20.  Telephone note 01/31/20: hypotensive at 102/60 - reduced lopressor to 50mg  BID.  Here today for follow up.  No recurrent hypotensive episode.  Heart rate in 60s at home, here 53.  Has easy bruising on Eliquis.  Plan for radiation therapy for prostate cancer.  He has polycythemia vera and does frequent blood draws.  Denies chest pain, shortness of breath, orthopnea, PND, syncope, lower extremity edema or melena. Walks 1-2 miles without any issue.   Past Medical History:  Diagnosis Date  . BPH (benign prostatic hyperplasia)   . Cataract 2016   Had Bil surgery  . Chest congestion 11/29/2018  . Coronary atherosclerosis of native coronary artery    Vessel CABG 2005  . Hyperlipidemia   . Hypertension   .  Myocardial infarction [facility]) 1997   Had stent put in 197/CABG in2005  . Shoulder pain    right  side  . SOB (shortness of breath) on exertion     Past Surgical History:  Procedure Laterality Date  . ARTERIAL DUPLEX  04/19/2004   Carotid-no evidence of significant plaque noted, no evidence of significant ICA stenosis, artery flow is antegrade. Upper extremities-within normal limits. Lower extremities-no evidence of lower extremity arterial occlusive disease at rest  . BLADDER SURGERY     had a growth in bladder/benign  . CARDIAC CATHETERIZATION  04/19/2004   severe four vessel coronary disease, continue vigorous medical therapy, recommendations for multivessel bypass grafting  . CARDIOVASCULAR STRESS TEST  07/01/2009   normal pattern of perfusion in all regions, no scintigraphic evidence of inducible myocardial ischemia,EKG negative for ischemia, no ECG changes,  . CATARACT EXTRACTION, BILATERAL    . CORONARY ARTERY BYPASS GRAFT  04/20/2004   x4. LIMA to LAD, left radial artery to RCA, SVG to ramus intermedius, SVG to circ.  Marland Kitchen DOPPLER ECHOCARDIOGRAPHY  11/11/2012   EF 55-60%, wall motion normal, no regional wall motion abnormalities, thickness of ventricular septum was mildly increased, mild regurg of the mitral valve    Current Medications:  Prior to Admission medications   Medication Sig Start Date End Date Taking? Authorizing Provider  apixaban (ELIQUIS) 5 MG TABS tablet Take 1 tablet (5 mg total) by  mouth 2 (two) times daily. 01/07/20  Yes Ghimire, Henreitta Leber, MD  diltiazem (CARDIZEM CD) 120 MG 24 hr capsule Take 1 capsule (120 mg total) by mouth daily. 01/30/20  Yes Sherren Mocha, MD  hydroxyurea (HYDREA) 500 MG capsule Take 500 mg by mouth daily.  01/03/19  Yes [provider]  metoprolol tartrate (LOPRESSOR) 100 MG tablet Take 100 mg by mouth 2 (two) times daily. Per patient taking 1/2 tablet twice a day   Yes [provider]  rosuvastatin (CRESTOR) 40 MG tablet TAKE 1  TABLET(40 MG) BY MOUTH DAILY 03/17/19  Yes Sherren Mocha, MD  triamcinolone cream (KENALOG) 0.5 % Apply 1 application topically as needed (rash).  03/10/19  Yes [provider]    Allergies:   Zocor [simvastatin]   Social History   Socioeconomic History  . Marital status: Married    Spouse name: Not on file  . Number of children: Not on file  . Years of education: Not on file  . Highest education level: Not on file  Occupational History  . Not on file  Tobacco Use  . Smoking status: Former Smoker    Quit date: 04/17/1979    Years since quitting: 40.8  . Smokeless tobacco: Never Used  Substance and Sexual Activity  . Alcohol use: Yes    Alcohol/week: 12.0 standard drinks    Types: 12 Cans of beer per week  . Drug use: No  . Sexual activity: Not on file  Other Topics Concern  . Not on file  Social History Narrative   The patient is married. He [facility] at [facility]. Retired since 2012. Remote smoker.   Social Determinants of Health   Financial Resource Strain:   . Difficulty of Paying Living Expenses:   Food Insecurity:   . Worried About Charity fundraiser in the Last Year:   . Arboriculturist in the Last Year:   Transportation Needs:   . Film/video editor (Medical):   Marland Kitchen Lack of Transportation (Non-Medical):   Physical Activity:   . Days of Exercise per Week:   . Minutes of Exercise per Session:   Stress:   . Feeling of Stress :   Social Connections:   . Frequency of Communication with Friends and Family:   . Frequency of Social Gatherings with Friends and Family:   . Attends Religious Services:   . Active Member of Clubs or Organizations:   . Attends Archivist Meetings:   Marland Kitchen Marital Status:      Family History:  The patient's family history includes Colon cancer in his maternal uncle; Heart attack in his maternal grandfather, maternal uncle, maternal uncle, and maternal uncle; Heart disease in his sister.   ROS:   Please see the history  of present illness.    ROS All other systems reviewed and are negative.   PHYSICAL EXAM:   VS:  BP 110/70   Pulse (!) 53   Ht 5\' 11"  (1.803 m)   Wt 247 lb 12.8 oz (112.4 kg)   SpO2 98%   BMI 34.56 kg/m    GEN: Well nourished, well developed, in no acute distress  HEENT: normal  Neck: no JVD, carotid bruits, or masses Cardiac: RRR; no murmurs, rubs, or gallops,no edema  Respiratory:  clear to auscultation bilaterally, normal work of breathing GI: soft, nontender, nondistended, + BS MS: no deformity or atrophy  Skin: warm and dry, no rash Neuro:  Alert and Oriented x 3, Strength and  sensation are intact Psych: euthymic mood, full affect  Wt Readings from Last 3 Encounters:  02/05/20 247 lb 12.8 oz (112.4 kg)  01/29/20 240 lb (108.9 kg)  01/23/20 240 lb 9.6 oz (109.1 kg)      Studies/Labs Reviewed:   EKG:  EKG is ordered today.  The ekg ordered today demonstrates sinus bradycardia at rate of 53 bpm  Recent Labs: 01/05/2020: TSH 4.735 01/06/2020: B Natriuretic Peptide 300.1 01/07/2020: ALT 51; BUN 22; Creatinine, Ser 0.90; Hemoglobin 15.6; Platelets 574; Potassium 4.4; Sodium 139   Lipid Panel    Component Value Date/Time   CHOL 121 12/02/2019 0953   TRIG 110 12/02/2019 0953   HDL 44 12/02/2019 0953   CHOLHDL 2.8 12/02/2019 0953   CHOLHDL 3.1 07/13/2016 0832   VLDL 21 07/13/2016 0832   LDLCALC 57 12/02/2019 0953    Additional studies/ records that were reviewed today include:   Stress test 01/29/20  The left ventricular ejection fraction is hyperdynamic (>65%).  Nuclear stress EF: 66%.  There was no ST segment deviation noted during stress.  The study is normal.  This is a low risk study.  No change from prior study.  Echocardiogram: 12/2019 1. Left ventricular ejection fraction, by estimation, is 60 to 65%. The  left ventricle has normal function. The left ventricle has no regional  wall motion abnormalities. There is mild left ventricular hypertrophy.    Tissue doppler not performed.  2. The right ventricular size is normal.  3. Left atrial size was mild to moderately dilated.  4. Right atrial size was mildly dilated.  5. The mitral valve is grossly normal. Trivial mitral valve  regurgitation.  6. The aortic valve is tricuspid. Aortic valve regurgitation is not  visualized. Mild aortic valve sclerosis is present, with no evidence of  aortic valve stenosis.  7. Limited images due to poor acoustic windowsL    ASSESSMENT & PLAN:    1.  Paroxysmal atrial fibrillation Maintaining sinus rhythm with bradycardia.  Advised to keep track of heart rate.  If heart rate in 40s, he will give [place] a call.  Continue Eliquis for anticoagulation.  He has easy bruising.  Plan for monitor in few months to reevaluate recurrence of A. fib or not.  2.  CAD -No angina.  Not on aspirin while on Eliquis.  Continue Lopressor and Crestor.  3.  Hypertension -Blood pressure stable on Lopressor 50 mg twice daily and diltiazem 120 mg daily. -Continue current dose  4.  Hyperlipidemia -Continue statin.    Medication Adjustments/Labs and Tests Ordered: Current medicines are reviewed at length with the patient today.  Concerns regarding medicines are outlined above.  Medication changes, Labs and Tests ordered today are listed in the Patient Instructions below. Patient Instructions  Medication Instructions:  Your physician recommends that you continue on your current medications as directed. Please refer to the Current Medication list given to you today.  *If you need a refill on your cardiac medications before your next appointment, please call your pharmacy*   Lab Work: None ordered  If you have labs (blood work) drawn today and your tests are completely normal, you will receive your results only by: Marland Kitchen MyChart Message (if you have MyChart) OR . A paper copy in the mail If you have any lab test that is abnormal or we need to change your treatment, we  will call you to review the results.   Testing/Procedures: None ordered   Follow-Up: At [facility], you and your  health needs are our priority.  As part of our continuing mission to provide you with exceptional heart care, we have created designated Provider Care Teams.  These Care Teams include your primary Cardiologist (physician) and Advanced Practice Providers (APPs -  Physician Assistants and Nurse Practitioners) who all work together to provide you with the care you need, when you need it.  We recommend signing up for the patient portal called "MyChart".  Sign up information is provided on this After Visit Summary.  MyChart is used to connect with patients for Virtual Visits (Telemedicine).  Patients are able to view lab/test results, encounter notes, upcoming appointments, etc.  Non-urgent messages can be sent to your provider as well.   To learn more about what you can do with MyChart, go to [email or web address].    Your next appointment:   As scheduled with   The format for your next appointment:   In Person  Provider:   Sherren Mocha, MD   Other Instructions      Signed, Leanor Kail, PA  02/05/2020 11:49 AM    [facility] [address], [place], [place]  [address] Phone: [phone]; Fax: [phone]

## 2020-02-05 ENCOUNTER — Other Ambulatory Visit: Payer: Self-pay

## 2020-02-05 ENCOUNTER — Ambulatory Visit: Payer: Medicare Other | Admitting: Physician Assistant

## 2020-02-05 ENCOUNTER — Encounter: Payer: Self-pay | Admitting: Physician Assistant

## 2020-02-05 VITALS — BP 110/70 | HR 53 | Ht 71.0 in | Wt 247.8 lb

## 2020-02-05 DIAGNOSIS — I2510 Atherosclerotic heart disease of native coronary artery without angina pectoris: Secondary | ICD-10-CM

## 2020-02-05 DIAGNOSIS — I480 Paroxysmal atrial fibrillation: Secondary | ICD-10-CM

## 2020-02-05 DIAGNOSIS — E782 Mixed hyperlipidemia: Secondary | ICD-10-CM

## 2020-02-05 DIAGNOSIS — I25119 Atherosclerotic heart disease of native coronary artery with unspecified angina pectoris: Secondary | ICD-10-CM | POA: Diagnosis not present

## 2020-02-05 DIAGNOSIS — I10 Essential (primary) hypertension: Secondary | ICD-10-CM | POA: Diagnosis not present

## 2020-02-05 NOTE — Patient Instructions (Signed)
Medication Instructions:  Your physician recommends that you continue on your current medications as directed. Please refer to the Current Medication list given to you today.  *If you need a refill on your cardiac medications before your next appointment, please call your pharmacy*   Lab Work: None ordered  If you have labs (blood work) drawn today and your tests are completely normal, you will receive your results only by: Marland Kitchen MyChart Message (if you have MyChart) OR . A paper copy in the mail If you have any lab test that is abnormal or we need to change your treatment, we will call you to review the results.   Testing/Procedures: None ordered   Follow-Up: At [facility], you and your health needs are our priority.  As part of our continuing mission to provide you with exceptional heart care, we have created designated Provider Care Teams.  These Care Teams include your primary Cardiologist (physician) and Advanced Practice Providers (APPs -  Physician Assistants and Nurse Practitioners) who all work together to provide you with the care you need, when you need it.  We recommend signing up for the patient portal called "MyChart".  Sign up information is provided on this After Visit Summary.  MyChart is used to connect with patients for Virtual Visits (Telemedicine).  Patients are able to view lab/test results, encounter notes, upcoming appointments, etc.  Non-urgent messages can be sent to your provider as well.   To learn more about what you can do with MyChart, go to [email or web address].    Your next appointment:   As scheduled with   The format for your next appointment:   In Person  Provider:   Sherren Mocha, MD   Other Instructions

## 2020-02-06 DIAGNOSIS — C61 Malignant neoplasm of prostate: Secondary | ICD-10-CM | POA: Diagnosis not present

## 2020-02-06 DIAGNOSIS — I2510 Atherosclerotic heart disease of native coronary artery without angina pectoris: Secondary | ICD-10-CM | POA: Diagnosis not present

## 2020-02-06 DIAGNOSIS — D45 Polycythemia vera: Secondary | ICD-10-CM | POA: Diagnosis not present

## 2020-02-06 DIAGNOSIS — U071 COVID-19: Secondary | ICD-10-CM | POA: Diagnosis not present

## 2020-02-24 DIAGNOSIS — Z0120 Encounter for dental examination and cleaning without abnormal findings: Secondary | ICD-10-CM | POA: Diagnosis not present

## 2020-03-03 DIAGNOSIS — M79671 Pain in right foot: Secondary | ICD-10-CM | POA: Diagnosis not present

## 2020-03-03 DIAGNOSIS — M25571 Pain in right ankle and joints of right foot: Secondary | ICD-10-CM | POA: Diagnosis not present

## 2020-03-03 DIAGNOSIS — I10 Essential (primary) hypertension: Secondary | ICD-10-CM | POA: Diagnosis not present

## 2020-03-03 DIAGNOSIS — Z6833 Body mass index (BMI) 33.0-33.9, adult: Secondary | ICD-10-CM | POA: Diagnosis not present

## 2020-03-04 DIAGNOSIS — M7661 Achilles tendinitis, right leg: Secondary | ICD-10-CM | POA: Diagnosis not present

## 2020-03-04 DIAGNOSIS — S86011A Strain of right Achilles tendon, initial encounter: Secondary | ICD-10-CM | POA: Diagnosis not present

## 2020-03-08 DIAGNOSIS — D72829 Elevated white blood cell count, unspecified: Secondary | ICD-10-CM | POA: Diagnosis not present

## 2020-03-08 DIAGNOSIS — D751 Secondary polycythemia: Secondary | ICD-10-CM | POA: Diagnosis not present

## 2020-03-09 DIAGNOSIS — C61 Malignant neoplasm of prostate: Secondary | ICD-10-CM | POA: Diagnosis not present

## 2020-03-09 DIAGNOSIS — Z8551 Personal history of malignant neoplasm of bladder: Secondary | ICD-10-CM | POA: Diagnosis not present

## 2020-03-09 DIAGNOSIS — Z906 Acquired absence of other parts of urinary tract: Secondary | ICD-10-CM | POA: Diagnosis not present

## 2020-03-09 DIAGNOSIS — Z87891 Personal history of nicotine dependence: Secondary | ICD-10-CM | POA: Diagnosis not present

## 2020-03-09 DIAGNOSIS — Z191 Hormone sensitive malignancy status: Secondary | ICD-10-CM | POA: Diagnosis not present

## 2020-03-16 ENCOUNTER — Other Ambulatory Visit: Payer: Self-pay | Admitting: *Deleted

## 2020-03-16 MED ORDER — APIXABAN 5 MG PO TABS: 5.0000 mg | ORAL_TABLET | Freq: Two times a day (BID) | ORAL | 2 refills | Status: DC

## 2020-03-16 NOTE — Telephone Encounter (Signed)
Eliquis 5mg  refill request received. Patient is 74 years old, weight-112.4kg, Crea-0.90 on 01/07/2020, Diagnosis-Afib, and last seen by Vin PA on 02/05/2020. Dose is appropriate based on dosing criteria. Will send in refill to requested pharmacy.

## 2020-03-19 DIAGNOSIS — R0982 Postnasal drip: Secondary | ICD-10-CM | POA: Diagnosis not present

## 2020-03-19 DIAGNOSIS — U071 COVID-19: Secondary | ICD-10-CM | POA: Diagnosis not present

## 2020-03-19 DIAGNOSIS — F5102 Adjustment insomnia: Secondary | ICD-10-CM | POA: Diagnosis not present

## 2020-03-19 DIAGNOSIS — R911 Solitary pulmonary nodule: Secondary | ICD-10-CM | POA: Diagnosis not present

## 2020-03-19 DIAGNOSIS — J309 Allergic rhinitis, unspecified: Secondary | ICD-10-CM | POA: Diagnosis not present

## 2020-03-21 ENCOUNTER — Other Ambulatory Visit: Payer: Self-pay | Admitting: Cardiovascular Disease

## 2020-03-22 DIAGNOSIS — Z5111 Encounter for antineoplastic chemotherapy: Secondary | ICD-10-CM | POA: Diagnosis not present

## 2020-03-22 DIAGNOSIS — C61 Malignant neoplasm of prostate: Secondary | ICD-10-CM | POA: Diagnosis not present

## 2020-04-05 DIAGNOSIS — R911 Solitary pulmonary nodule: Secondary | ICD-10-CM | POA: Diagnosis not present

## 2020-04-06 DIAGNOSIS — C61 Malignant neoplasm of prostate: Secondary | ICD-10-CM | POA: Diagnosis not present

## 2020-04-06 DIAGNOSIS — D45 Polycythemia vera: Secondary | ICD-10-CM | POA: Diagnosis not present

## 2020-04-06 DIAGNOSIS — E7800 Pure hypercholesterolemia, unspecified: Secondary | ICD-10-CM | POA: Diagnosis not present

## 2020-04-06 DIAGNOSIS — I259 Chronic ischemic heart disease, unspecified: Secondary | ICD-10-CM | POA: Diagnosis not present

## 2020-04-15 DIAGNOSIS — I480 Paroxysmal atrial fibrillation: Secondary | ICD-10-CM

## 2020-04-19 ENCOUNTER — Telehealth: Payer: Self-pay | Admitting: Cardiovascular Disease

## 2020-04-19 NOTE — Telephone Encounter (Signed)
Will send call to both Dr. Antionette Char nurse as well as our prior auth nurse to see if maybe the pt may qualify for the assistance program.

## 2020-04-19 NOTE — Telephone Encounter (Signed)
**Note De-Identified  Obfuscation** The pt states that he received a letter from his ins provider [facility]) recommending that he switch from Eliquis to Warfarin as it is less expensive. He states that Eliquis and Diltiazem costs him around $67 a month and that is too expensive for him as he is living on a fixed income.  I discussed Warfarin with him and he states that it is too far for him to drive to the office that often for the INR checks.  I advised him that I will contact his pharmacy to get a better idea of he costs as the pt states that he does not have that info with him at the moment.

## 2020-04-19 NOTE — Telephone Encounter (Signed)
Checked GoodRx and patient should be able to get diltiazem CD 120mg  #30 for between a $9.00 and $15.00 copay depending on the pharmacy

## 2020-04-19 NOTE — Telephone Encounter (Signed)
Thx

## 2020-04-19 NOTE — Telephone Encounter (Signed)
    Pt c/o medication issue:  1. Name of Medication:   apixaban (ELIQUIS) 5 MG TABS tablet    2. How are you currently taking this medication (dosage and times per day)?   3. Are you having a reaction (difficulty breathing--STAT)?   4. What is your medication issue? Pt would like to know if he can get a lower cost therapeutic alternative like warfarin 5 mg. He said eliquis is very expensive

## 2020-04-19 NOTE — Telephone Encounter (Signed)
**Note De-Identified  Obfuscation** Per [place] the pt pays $37/ 30 day supply for his Eliquis and $26/30 day supply of Diltiazem.  I called the pt back and he states that he is willing to pay that amount for Eliquis monthly but is requesting a lower cost substitute for Diltiazem.  He is aware that I am forwarding this note to Dr Burt Knack and our pharmacy team for advisement and that we will contact him with their recommendation once available.

## 2020-04-19 NOTE — Telephone Encounter (Signed)
**Note De-Identified  Obfuscation** The pt is advised to try [place] and he states that he will and thanked me for our assistance as he states any discounts will help.  He states that if he gets to a point that he cannot afford these medications he will call us back to discuss less costing alternatives.

## 2020-04-21 ENCOUNTER — Telehealth: Payer: Self-pay | Admitting: Cardiovascular Disease

## 2020-04-21 NOTE — Telephone Encounter (Signed)
Patient with diagnosis of afib on Eliquis for anticoagulation.    Procedure: space OAR / gold seed placement Date of procedure: 05/04/20  CHADS2-VASc score of 3 (age, HTN, CAD). Admitted 01/02/20 in afib, spontaneously converted to NSR.  CrCl 20mL/min using adjusted body weight Platelet count 574K  Ok to hold Eliquis for 3 days as requested since pt is relatively low risk.

## 2020-04-21 NOTE — Telephone Encounter (Signed)
   Primary Cardiologist: Sherren Mocha, MD  Chart reviewed as part of pre-operative protocol coverage. Given past medical history and time since last visit, based on ACC/AHA guidelines, Manraj Anmol Fleck would be at acceptable risk for the planned procedure without further cardiovascular testing.   Hx of CAD s/p CABG x 4 in 2005. Last stress test 01/2020 was low risk. Okay to hold ASA. Pharmacy to review anticoagulation.   [place], [place] 04/21/2020, 8:56 AM

## 2020-04-21 NOTE — Telephone Encounter (Signed)
   [facility] Pre-operative Risk Assessment    Request for surgical clearance:  1. What type of surgery is being performed? Space OAR / Girtha Rm Seed Placement    2. When is this surgery scheduled? 05/04/20   3. What type of clearance is required (medical clearance vs. Pharmacy clearance to hold med vs. Both)? Pharmacy Clearance  4. Are there any medications that need to be held prior to surgery and how long? Eliquis for 3 days / Aspirin for 5 days   5. Practice name and name of physician performing surgery? [facility]  Dr. Claudia Desanctis  6. What is your office phone number? [phone] (ext#: [phone])    7.   What is your office fax number? [phone]  8.   Anesthesia type (None, local, MAC, general) ? Local   Zara Council 04/21/2020, 8:12 AM  _________________________________________________________________   (provider comments below)

## 2020-04-27 NOTE — Telephone Encounter (Signed)
Call placed to [facility] @ [facility].  Left her a detailed message that I have faxed the clearance over the "secondary fax #" that she has provided and if she don't receive it, to call us back.

## 2020-04-27 NOTE — Telephone Encounter (Addendum)
   Primary [place], MD  Chart reviewed as part of pre-operative protocol coverage. Pre-op clearance already addressed by colleagues in earlier phone notes. To summarize recommendations:  - Per Vin Bhagat, PA-C "Given past medical history and time since last visit, based on ACC/AHA guidelines, [name] would be at acceptable risk for the planned procedure without further cardiovascular testing. "  - Per pharmacist review of protocol, "Ok to hold Eliquis for 3 days as requested since pt is relatively low risk."  Will route this bundled recommendation to requesting provider via Epic fax function and remove from pre-op pool.  Will also route to callback staff to call urology office back as requested.  Charlie Pitter, PA-C 04/27/2020, 8:58 AM

## 2020-04-27 NOTE — Telephone Encounter (Signed)
Lauren from [facility] was calling to follow up on the status of this clearance. She has not heard from our office and needs to inform the patient if he needs to hold any medication. Please advise

## 2020-04-27 NOTE — Telephone Encounter (Signed)
Leonard Washington requests a call back , as the fax machine she previously provided a number for is not working.A second fax number was provided of [phone], so please fax results of clearance to the secondary fax number as well

## 2020-04-30 ENCOUNTER — Telehealth: Payer: Self-pay

## 2020-04-30 NOTE — Telephone Encounter (Signed)
30 day Event monitor registered to be mailed next week due to pt being out of town.

## 2020-05-04 DIAGNOSIS — C61 Malignant neoplasm of prostate: Secondary | ICD-10-CM | POA: Diagnosis not present

## 2020-05-07 ENCOUNTER — Telehealth: Payer: Self-pay | Admitting: Cardiovascular Disease

## 2020-05-07 NOTE — Telephone Encounter (Signed)
New message   Patient would like to discuss having to take sticker off  for an MRI and radiation on his 30 day monitor. Please call to discuss.

## 2020-05-07 NOTE — Telephone Encounter (Signed)
Returned call to patient. Discussed he can take off and remove the monitor while he has his MRI and radiation treatments. He also knows to call the company to get more patches or sensitve skin ones if needed.

## 2020-05-09 ENCOUNTER — Encounter (INDEPENDENT_AMBULATORY_CARE_PROVIDER_SITE_OTHER): Payer: Medicare Other

## 2020-05-09 DIAGNOSIS — I480 Paroxysmal atrial fibrillation: Secondary | ICD-10-CM

## 2020-05-15 DIAGNOSIS — C61 Malignant neoplasm of prostate: Secondary | ICD-10-CM | POA: Diagnosis not present

## 2020-05-15 DIAGNOSIS — Z4889 Encounter for other specified surgical aftercare: Secondary | ICD-10-CM | POA: Diagnosis not present

## 2020-05-15 DIAGNOSIS — N400 Enlarged prostate without lower urinary tract symptoms: Secondary | ICD-10-CM | POA: Diagnosis not present

## 2020-06-07 DIAGNOSIS — I2510 Atherosclerotic heart disease of native coronary artery without angina pectoris: Secondary | ICD-10-CM

## 2020-06-07 DIAGNOSIS — E782 Mixed hyperlipidemia: Secondary | ICD-10-CM

## 2020-06-07 DIAGNOSIS — I10 Essential (primary) hypertension: Secondary | ICD-10-CM

## 2020-06-16 DIAGNOSIS — C61 Malignant neoplasm of prostate: Secondary | ICD-10-CM | POA: Diagnosis not present

## 2020-06-16 DIAGNOSIS — Z191 Hormone sensitive malignancy status: Secondary | ICD-10-CM | POA: Diagnosis not present

## 2020-06-17 ENCOUNTER — Telehealth: Payer: Self-pay | Admitting: *Deleted

## 2020-06-17 DIAGNOSIS — R9389 Abnormal findings on diagnostic imaging of other specified body structures: Secondary | ICD-10-CM | POA: Diagnosis not present

## 2020-06-17 NOTE — Telephone Encounter (Signed)
   [facility] Pre-operative Risk Assessment    HEARTCARE STAFF: - Please ensure there is not already an duplicate clearance open for this procedure. - Under Visit Info/Reason for Call, type in Other and utilize the format Clearance MM/DD/YY or Clearance TBD. Do not use dashes or single digits. - If request is for dental extraction, please clarify the # of teeth to be extracted.  Request for surgical clearance:  1. What type of surgery is being performed? COLONOSCOPY   2. When is this surgery scheduled? 07/20/20   3. What type of clearance is required (medical clearance vs. Pharmacy clearance to hold med vs. Both)? BOTH  4. Are there any medications that need to be held prior to surgery and how long? ELIQUIS x 2 DAYS PRIOR (DR. MISENHEIMER IS ASKING TO HOLD ELIQUIS BEGINNING 07/17/20)    5. Practice name and name of physician performing surgery? [facility]; DR. Christia Reading MISENHEIMER   6. What is the office phone number? [phone]   7.   What is the office fax number? [phone]  8.   Anesthesia type (None, local, MAC, general) ? NOT LISTED   Julaine Hua 06/17/2020, 1:06 PM  _________________________________________________________________   (provider comments below)

## 2020-06-20 NOTE — Telephone Encounter (Signed)
Clinical pharmacist to review Eliquis 

## 2020-06-21 NOTE — Telephone Encounter (Signed)
Patient with diagnosis of afib on Eliquis for anticoagulation.    Procedure: colonoscopy Date of procedure: 07/20/20  CHADS2-VASc score of 3 (age, HTN, CAD)  CrCl >147mL/min Platelet count 532K  Per office protocol, patient can hold Eliquis for 2 days prior to procedure.

## 2020-06-21 NOTE — Telephone Encounter (Signed)
Dr Burt Knack can you review this patient's monitor from 8/12- it shows no PAF.  I believe the plan was to consider stopping anticoagulation if he remained in NSR ( PAF after COVID March 2021).  Thanks  Kerin Ransom PA-C 06/21/2020 10:03 AM

## 2020-06-22 ENCOUNTER — Telehealth: Payer: Self-pay | Admitting: Cardiology

## 2020-06-22 NOTE — Telephone Encounter (Signed)
Left message to call back  Kerin Ransom PA-C 06/22/2020 10:54 AM

## 2020-06-22 NOTE — Telephone Encounter (Signed)
Reviewed monitor. No afib. OK to stop eliquis and transition to ASA 81 mg daily. thanks

## 2020-06-22 NOTE — Telephone Encounter (Signed)
Herron is returning Luke's call.

## 2020-06-22 NOTE — Addendum Note (Signed)
Addended by: Denim Start E on: 06/22/2020 01:59 PM   Modules accepted: Orders

## 2020-06-22 NOTE — Telephone Encounter (Signed)
Med list updated - Eliquis has been removed and aspirin has been added.

## 2020-06-22 NOTE — Telephone Encounter (Signed)
The patient was contacted today for preop clearance.  He mentioned to me that he had had some dizziness on metoprolol 50 mg twice daily.  He had been instructed to try taking 25 mg twice daily.  He told me he was going to just take 25 mg nightly, I suggested he take it every morning instead.  I told him dizziness can be from several different things, not necessarily medication.  I asked him to contact us if his symptoms worsen.  Kerin Ransom PA-C 06/22/2020 1:47 PM

## 2020-06-22 NOTE — Telephone Encounter (Signed)
   Primary Cardiologist: Sherren Mocha, MD  Chart reviewed and patient contacted today by phone as part of pre-operative protocol coverage. Given past medical history and time since last visit, based on ACC/AHA guidelines, Leonard Washington would be at acceptable risk for the planned procedure without further cardiovascular testing.   Dr Burt Knack feels the patient can stop Eliquis permanently and go on an 81 mg aspirin daily.  If needed he can hold his aspirin 3-5 days pre op. The patient was informed of these recommendations.   I will route this recommendation to the requesting party via Epic fax function and remove from pre-op pool.  Please call with questions.  Kerin Ransom, PA-C 06/22/2020, 1:41 PM

## 2020-06-23 MED ORDER — METOPROLOL TARTRATE 25 MG PO TABS: 25.0000 mg | ORAL_TABLET | Freq: Every morning | ORAL | 11 refills | Status: DC

## 2020-06-23 NOTE — Telephone Encounter (Signed)
Metoprolol 25 mg qAM called in to pharmacy. Med list updated.

## 2020-06-23 NOTE — Telephone Encounter (Signed)
Just leave him on lopressor 25 mg Q AM- we may end up stopping it altogether if he can't tolerate it.  Kerin Ransom PA-C 06/23/2020 10:22 AM

## 2020-06-23 NOTE — Addendum Note (Signed)
Addended by: Harland German A on: 06/23/2020 10:45 AM   Modules accepted: Orders

## 2020-06-23 NOTE — Telephone Encounter (Signed)
thanks

## 2020-06-23 NOTE — Telephone Encounter (Signed)
Leonard Washington- I'm trying to update this patient's medication list but need clarification. Will he be taking Lopressor 25 mg once daily in the morning? Would you like to switch to succinate if only once a day? Thank you!

## 2020-07-01 DIAGNOSIS — I10 Essential (primary) hypertension: Secondary | ICD-10-CM | POA: Diagnosis not present

## 2020-07-01 DIAGNOSIS — I2510 Atherosclerotic heart disease of native coronary artery without angina pectoris: Secondary | ICD-10-CM | POA: Diagnosis not present

## 2020-07-01 DIAGNOSIS — E782 Mixed hyperlipidemia: Secondary | ICD-10-CM | POA: Diagnosis not present

## 2020-07-01 LAB — COMPREHENSIVE METABOLIC PANEL
ALT: 17 IU/L (ref 0–44)
AST: 20 IU/L (ref 0–40)
Albumin/Globulin Ratio: 1.9 (ref 1.2–2.2)
Albumin: 4.2 g/dL (ref 3.7–4.7)
Alkaline Phosphatase: 99 IU/L (ref 48–121)
BUN/Creatinine Ratio: 12 (ref 10–24)
BUN: 13 mg/dL (ref 8–27)
Bilirubin Total: 0.6 mg/dL (ref 0.0–1.2)
CO2: 27 mmol/L (ref 20–29)
Calcium: 9.6 mg/dL (ref 8.6–10.2)
Chloride: 101 mmol/L (ref 96–106)
Creatinine, Ser: 1.09 mg/dL (ref 0.76–1.27)
GFR calc Af Amer: 77 mL/min/{1.73_m2} (ref >59)
GFR calc non Af Amer: 67 mL/min/{1.73_m2} (ref >59)
Globulin, Total: 2.2 g/dL (ref 1.5–4.5)
Glucose: 118 mg/dL — ABNORMAL HIGH (ref 65–99)
Potassium: 5 mmol/L (ref 3.5–5.2)
Sodium: 142 mmol/L (ref 134–144)
Total Protein: 6.4 g/dL (ref 6.0–8.5)

## 2020-07-02 LAB — CBC WITH DIFFERENTIAL/PLATELET
Basophils Absolute: 0.1 10*3/uL (ref 0.0–0.2)
Basos: 1 %
EOS (ABSOLUTE): 0.4 10*3/uL (ref 0.0–0.4)
Eos: 3 %
Hematocrit: 54.3 % — ABNORMAL HIGH (ref 37.5–51.0)
Hemoglobin: 16.9 g/dL (ref 13.0–17.7)
Immature Grans (Abs): 0 10*3/uL (ref 0.0–0.1)
Immature Granulocytes: 0 %
Lymphocytes Absolute: 1.2 10*3/uL (ref 0.7–3.1)
Lymphs: 10 %
MCH: 25.4 pg — ABNORMAL LOW (ref 26.6–33.0)
MCHC: 31.1 g/dL — ABNORMAL LOW (ref 31.5–35.7)
MCV: 82 fL (ref 79–97)
Monocytes Absolute: 0.4 10*3/uL (ref 0.1–0.9)
Monocytes: 3 %
Neutrophils Absolute: 10.3 10*3/uL — ABNORMAL HIGH (ref 1.4–7.0)
Neutrophils: 83 %
Platelets: 381 10*3/uL (ref 150–450)
RBC: 6.65 x10E6/uL — ABNORMAL HIGH (ref 4.14–5.80)
RDW: 19.3 % — ABNORMAL HIGH (ref 11.6–15.4)
WBC: 12.4 10*3/uL — ABNORMAL HIGH (ref 3.4–10.8)

## 2020-07-07 ENCOUNTER — Encounter: Payer: Self-pay | Admitting: Cardiovascular Disease

## 2020-07-07 ENCOUNTER — Other Ambulatory Visit: Payer: Self-pay

## 2020-07-07 ENCOUNTER — Ambulatory Visit: Payer: Medicare Other | Admitting: Cardiovascular Disease

## 2020-07-07 VITALS — BP 140/80 | HR 65 | Ht 71.0 in | Wt 260.0 lb

## 2020-07-07 DIAGNOSIS — I2510 Atherosclerotic heart disease of native coronary artery without angina pectoris: Secondary | ICD-10-CM | POA: Diagnosis not present

## 2020-07-07 DIAGNOSIS — I10 Essential (primary) hypertension: Secondary | ICD-10-CM

## 2020-07-07 DIAGNOSIS — E782 Mixed hyperlipidemia: Secondary | ICD-10-CM | POA: Diagnosis not present

## 2020-07-07 DIAGNOSIS — I480 Paroxysmal atrial fibrillation: Secondary | ICD-10-CM

## 2020-07-07 NOTE — Patient Instructions (Signed)

## 2020-07-07 NOTE — Progress Notes (Signed)
Cardiology Office Note:    Date:  07/07/2020   ID:  Leonard Washington, DOB Aug 15, 1946, MRN [id number]  PCP:  Serita Grammes, MD  [facility] Cardiologist:  Sherren Mocha, MD  [place] Electrophysiologist:  None   Referring MD: Serita Grammes, MD   Chief Complaint  Patient presents with  . Coronary Artery Disease    History of Present Illness:    Leonard Washington is a 75 y.o. male with a hx of coronary artery disease status post remote CABG, presenting for follow-up evaluation.  The patient underwent four-vessel CABG in 2005 with a LIMA to LAD, left radial to RCA, saphenous vein graft to ramus intermedius, and saphenous vein graft to left circumflex marginal.  Comorbid conditions include hypertension and mixed hyperlipidemia.  Noncardiac issues include prostate cancer and polycythemia vera.  The patient was hospitalized in March 2021 with COVID-19 pneumonia.  His illness was complicated by demand ischemia and atrial fibrillation.  He was anticoagulated with apixaban and converted spontaneously back to sinus rhythm.  Outpatient event monitoring once he recovered demonstrated no recurrence of atrial fibrillation.  He has been transitioned back to antiplatelet therapy with aspirin 81 mg daily.  He underwent a stress Myoview scan demonstrating no ischemia.  He presents today for follow-up evaluation.  He is doing well from a cardiac perspective. Today, he denies symptoms of palpitations, chest pain, shortness of breath, orthopnea, PND, lower extremity edema, dizziness, or syncope. He had problems with extensive bruising on anticoagulation and is happy to be back on low dose aspirin.  He admits that his weight is a problem and that he is going out to eat too much.  Past Medical History:  Diagnosis Date  . BPH (benign prostatic hyperplasia)   . Cataract 2016   Had Bil surgery  . Chest congestion 11/29/2018  . Coronary atherosclerosis of native coronary artery    Vessel  CABG 2005  . Hyperlipidemia   . Hypertension   . Myocardial infarction [facility]) 1997   Had stent put in 197/CABG in2005  . Shoulder pain    right  side  . SOB (shortness of breath) on exertion     Past Surgical History:  Procedure Laterality Date  . ARTERIAL DUPLEX  04/19/2004   Carotid-no evidence of significant plaque noted, no evidence of significant ICA stenosis, artery flow is antegrade. Upper extremities-within normal limits. Lower extremities-no evidence of lower extremity arterial occlusive disease at rest  . BLADDER SURGERY     had a growth in bladder/benign  . CARDIAC CATHETERIZATION  04/19/2004   severe four vessel coronary disease, continue vigorous medical therapy, recommendations for multivessel bypass grafting  . CARDIOVASCULAR STRESS TEST  07/01/2009   normal pattern of perfusion in all regions, no scintigraphic evidence of inducible myocardial ischemia,EKG negative for ischemia, no ECG changes,  . CATARACT EXTRACTION, BILATERAL    . CORONARY ARTERY BYPASS GRAFT  04/20/2004   x4. LIMA to LAD, left radial artery to RCA, SVG to ramus intermedius, SVG to circ.  Marland Kitchen DOPPLER ECHOCARDIOGRAPHY  11/11/2012   EF 55-60%, wall motion normal, no regional wall motion abnormalities, thickness of ventricular septum was mildly increased, mild regurg of the mitral valve    Current Medications: Current Meds  Medication Sig  . aspirin EC 81 MG tablet Take 81 mg by mouth daily.   Marland Kitchen diltiazem (CARDIZEM CD) 120 MG 24 hr capsule Take 1 capsule (120 mg total) by mouth daily.  . hydroxyurea (HYDREA) 500 MG capsule Take 500 mg by  mouth daily.   . metoprolol tartrate (LOPRESSOR) 25 MG tablet Take 1 tablet (25 mg total) by mouth in the morning.  . rosuvastatin (CRESTOR) 40 MG tablet TAKE 1 TABLET(40 MG) BY MOUTH DAILY  . triamcinolone cream (KENALOG) 0.5 % Apply 1 application topically as needed (rash).      Allergies:   Zocor [simvastatin]   Social History   Socioeconomic History  . Marital  status: Married    Spouse name: Not on file  . Number of children: Not on file  . Years of education: Not on file  . Highest education level: Not on file  Occupational History  . Not on file  Tobacco Use  . Smoking status: Former Smoker    Quit date: 04/17/1979    Years since quitting: 41.2  . Smokeless tobacco: Never Used  Vaping Use  . Vaping Use: Never used  Substance and Sexual Activity  . Alcohol use: Yes    Alcohol/week: 12.0 standard drinks    Types: 12 Cans of beer per week  . Drug use: No  . Sexual activity: Not on file  Other Topics Concern  . Not on file  Social History Narrative   The patient is married. He lives at [facility]. Retired since 2012. Remote smoker.   Social Determinants of Health   Financial Resource Strain:   . Difficulty of Paying Living Expenses: Not on file  Food Insecurity:   . Worried About Charity fundraiser in the Last Year: Not on file  . Ran Out of Food in the Last Year: Not on file  Transportation Needs:   . Lack of Transportation (Medical): Not on file  . Lack of Transportation (Non-Medical): Not on file  Physical Activity:   . Days of Exercise per Week: Not on file  . Minutes of Exercise per Session: Not on file  Stress:   . Feeling of Stress : Not on file  Social Connections:   . Frequency of Communication with Friends and Family: Not on file  . Frequency of Social Gatherings with Friends and Family: Not on file  . Attends Religious Services: Not on file  . Active Member of Clubs or Organizations: Not on file  . Attends Archivist Meetings: Not on file  . Marital Status: Not on file     Family History: The patient's family history includes Colon cancer in his maternal uncle; Heart attack in his maternal grandfather, maternal uncle, maternal uncle, and maternal uncle; Heart disease in his sister.  ROS:   Please see the history of present illness.    All other systems reviewed and are negative.  EKGs/Labs/Other  Studies Reviewed:    The following studies were reviewed today: Myoview stress test 01/29/2020: Study Highlights   The left ventricular ejection fraction is hyperdynamic (>65%).  Nuclear stress EF: 66%.  There was no ST segment deviation noted during stress.  The study is normal.  This is a low risk study.  No change from prior study.  2D echocardiogram 01/02/2020: IMPRESSIONS    1. Left ventricular ejection fraction, by estimation, is 60 to 65%. The  left ventricle has normal function. The left ventricle has no regional  wall motion abnormalities. There is mild left ventricular hypertrophy.  Tissue doppler not performed.  2. The right ventricular size is normal.  3. Left atrial size was mild to moderately dilated.  4. Right atrial size was mildly dilated.  5. The mitral valve is grossly normal. Trivial mitral valve  regurgitation.  6. The aortic valve is tricuspid. Aortic valve regurgitation is not  visualized. Mild aortic valve sclerosis is present, with no evidence of  aortic valve stenosis.  7. Limited images due to poor acoustic windowsL   Cardiac event monitor 06/10/2020: Study Highlights  1. The basic rhythm is normal sinus with an average HR of 69 bpm 2. No atrial fibrillation or flutter 3. No high-grade heart block or pathologic pauses 4. There are occasional PVC's and rare supraventricular beats without sustained arrhythmias. There is a single ventricular run of 8 beats.  EKG:  EKG is not ordered today.    Recent Labs: 01/05/2020: TSH 4.735 01/06/2020: B Natriuretic Peptide 300.1 07/01/2020: ALT 17; BUN 13; Creatinine, Ser 1.09; Hemoglobin 16.9; Platelets 381; Potassium 5.0; Sodium 142  Recent Lipid Panel    Component Value Date/Time   CHOL 121 12/02/2019 0953   TRIG 110 12/02/2019 0953   HDL 44 12/02/2019 0953   CHOLHDL 2.8 12/02/2019 0953   CHOLHDL 3.1 07/13/2016 0832   VLDL 21 07/13/2016 0832   LDLCALC 57 12/02/2019 0953    Physical Exam:     VS:  BP 140/80   Pulse 65   Ht 5\' 11"  (1.803 m)   Wt 260 lb (117.9 kg)   SpO2 98%   BMI 36.26 kg/m     Wt Readings from Last 3 Encounters:  07/07/20 260 lb (117.9 kg)  02/05/20 247 lb 12.8 oz (112.4 kg)  01/29/20 240 lb (108.9 kg)     GEN:  Well nourished, well developed in no acute distress HEENT: Normal NECK: No JVD; No carotid bruits LYMPHATICS: No lymphadenopathy CARDIAC: RRR, no murmurs, rubs, gallops RESPIRATORY:  Clear to auscultation without rales, wheezing or rhonchi  ABDOMEN: Soft, non-tender, non-distended MUSCULOSKELETAL:  No edema; No deformity  SKIN: Warm and dry NEUROLOGIC:  Alert and oriented x 3 PSYCHIATRIC:  Normal affect   ASSESSMENT:    1. Paroxysmal atrial fibrillation (HCC)   2. Mixed hyperlipidemia   3. Coronary artery disease involving native coronary artery of native heart without angina pectoris   4. Essential hypertension    PLAN:    In order of problems listed above:  1. Single event during COVID-19 infection.  No recurrence.  Off of anticoagulation after period of event monitoring showed no atrial fibrillation.  Continue beta-blocker. 2. Treated with a high intensity statin drug.  Most recent labs reviewed.  LDL cholesterol 69 mg/dL.  Continue current therapy.  Lifestyle modification discussed. 3. No anginal symptoms unless he does very vigorous work.  Low risk stress test earlier this year noted.  Continue medical management with aspirin, beta-blocker, and calcium blocker. 4. Blood pressure borderline today.  Has been within normal limits on past visits.  Discussed diet and lifestyle modification.  Advised him to monitor his blood pressure periodically at home.    Medication Adjustments/Labs and Tests Ordered: Current medicines are reviewed at length with the patient today.  Concerns regarding medicines are outlined above.  No orders of the defined types were placed in this encounter.  No orders of the defined types were placed in this  encounter.   Patient Instructions  Medication Instructions:  Your provider recommends that you continue on your current medications as directed. Please refer to the Current Medication list given to you today.   *If you need a refill on your cardiac medications before your next appointment, please call your pharmacy*   Follow-Up: At [facility], you and your health needs are our priority.  As part of our continuing mission to provide you with exceptional heart care, we have created designated Provider Care Teams.  These Care Teams include your primary Cardiologist (physician) and Advanced Practice Providers (APPs -  Physician Assistants and Nurse Practitioners) who all work together to provide you with the care you need, when you need it. Your next appointment:   6 month(s) The format for your next appointment:   In Person Provider:   You may see Sherren Mocha, MD or one of the following Advanced Practice Providers on your designated Care Team:    Richardson Dopp, PA-C  Robbie Lis, [place]      Signed, Sherren Mocha, MD  07/07/2020 9:10 AM    [place]

## 2020-07-09 DIAGNOSIS — D45 Polycythemia vera: Secondary | ICD-10-CM | POA: Diagnosis not present

## 2020-07-16 DIAGNOSIS — Z1159 Encounter for screening for other viral diseases: Secondary | ICD-10-CM | POA: Diagnosis not present

## 2020-07-16 DIAGNOSIS — Z20828 Contact with and (suspected) exposure to other viral communicable diseases: Secondary | ICD-10-CM | POA: Diagnosis not present

## 2020-07-20 DIAGNOSIS — K648 Other hemorrhoids: Secondary | ICD-10-CM | POA: Diagnosis not present

## 2020-07-20 DIAGNOSIS — D124 Benign neoplasm of descending colon: Secondary | ICD-10-CM | POA: Diagnosis not present

## 2020-07-20 DIAGNOSIS — Z951 Presence of aortocoronary bypass graft: Secondary | ICD-10-CM | POA: Diagnosis not present

## 2020-07-20 DIAGNOSIS — D122 Benign neoplasm of ascending colon: Secondary | ICD-10-CM | POA: Diagnosis not present

## 2020-07-20 DIAGNOSIS — D126 Benign neoplasm of colon, unspecified: Secondary | ICD-10-CM | POA: Diagnosis not present

## 2020-07-20 DIAGNOSIS — Z86010 Personal history of colonic polyps: Secondary | ICD-10-CM | POA: Diagnosis not present

## 2020-07-20 DIAGNOSIS — K6289 Other specified diseases of anus and rectum: Secondary | ICD-10-CM | POA: Diagnosis not present

## 2020-07-20 DIAGNOSIS — K635 Polyp of colon: Secondary | ICD-10-CM | POA: Diagnosis not present

## 2020-07-20 DIAGNOSIS — Z955 Presence of coronary angioplasty implant and graft: Secondary | ICD-10-CM | POA: Diagnosis not present

## 2020-07-20 DIAGNOSIS — Z79899 Other long term (current) drug therapy: Secondary | ICD-10-CM | POA: Diagnosis not present

## 2020-07-20 DIAGNOSIS — K5730 Diverticulosis of large intestine without perforation or abscess without bleeding: Secondary | ICD-10-CM | POA: Diagnosis not present

## 2020-07-20 DIAGNOSIS — D45 Polycythemia vera: Secondary | ICD-10-CM | POA: Diagnosis not present

## 2020-07-20 DIAGNOSIS — Z8546 Personal history of malignant neoplasm of prostate: Secondary | ICD-10-CM | POA: Diagnosis not present

## 2020-08-13 DIAGNOSIS — I10 Essential (primary) hypertension: Secondary | ICD-10-CM | POA: Diagnosis not present

## 2020-08-13 DIAGNOSIS — I480 Paroxysmal atrial fibrillation: Secondary | ICD-10-CM | POA: Diagnosis not present

## 2020-08-13 DIAGNOSIS — D692 Other nonthrombocytopenic purpura: Secondary | ICD-10-CM | POA: Diagnosis not present

## 2020-08-13 DIAGNOSIS — C61 Malignant neoplasm of prostate: Secondary | ICD-10-CM | POA: Diagnosis not present

## 2020-08-27 DIAGNOSIS — T148XXA Other injury of unspecified body region, initial encounter: Secondary | ICD-10-CM | POA: Diagnosis not present

## 2020-08-27 DIAGNOSIS — I10 Essential (primary) hypertension: Secondary | ICD-10-CM | POA: Diagnosis not present

## 2020-08-27 DIAGNOSIS — Z23 Encounter for immunization: Secondary | ICD-10-CM | POA: Diagnosis not present

## 2020-08-27 DIAGNOSIS — L089 Local infection of the skin and subcutaneous tissue, unspecified: Secondary | ICD-10-CM | POA: Diagnosis not present

## 2020-08-27 DIAGNOSIS — C61 Malignant neoplasm of prostate: Secondary | ICD-10-CM | POA: Diagnosis not present

## 2020-08-30 ENCOUNTER — Other Ambulatory Visit: Payer: Self-pay | Admitting: Hematology and Oncology

## 2020-08-30 DIAGNOSIS — D72829 Elevated white blood cell count, unspecified: Secondary | ICD-10-CM

## 2020-09-07 ENCOUNTER — Telehealth: Payer: Self-pay | Admitting: Oncology

## 2020-09-07 NOTE — Telephone Encounter (Signed)
Per patient 11/9 - Cancel 11/12 Labs, 11/15 Phlebotomy Therapy.  Performed at another facilty

## 2020-09-08 DIAGNOSIS — Z510 Encounter for antineoplastic radiation therapy: Secondary | ICD-10-CM | POA: Diagnosis not present

## 2020-09-08 DIAGNOSIS — C61 Malignant neoplasm of prostate: Secondary | ICD-10-CM | POA: Diagnosis not present

## 2020-09-08 DIAGNOSIS — Z191 Hormone sensitive malignancy status: Secondary | ICD-10-CM | POA: Diagnosis not present

## 2020-09-10 ENCOUNTER — Inpatient Hospital Stay: Payer: Medicare Other

## 2020-09-13 ENCOUNTER — Inpatient Hospital Stay: Payer: Medicare Other

## 2020-09-15 DIAGNOSIS — C61 Malignant neoplasm of prostate: Secondary | ICD-10-CM | POA: Diagnosis not present

## 2020-09-15 DIAGNOSIS — Z510 Encounter for antineoplastic radiation therapy: Secondary | ICD-10-CM | POA: Diagnosis not present

## 2020-09-15 DIAGNOSIS — Z191 Hormone sensitive malignancy status: Secondary | ICD-10-CM | POA: Diagnosis not present

## 2020-09-16 DIAGNOSIS — Z510 Encounter for antineoplastic radiation therapy: Secondary | ICD-10-CM | POA: Diagnosis not present

## 2020-09-16 DIAGNOSIS — C61 Malignant neoplasm of prostate: Secondary | ICD-10-CM | POA: Diagnosis not present

## 2020-09-16 DIAGNOSIS — Z191 Hormone sensitive malignancy status: Secondary | ICD-10-CM | POA: Diagnosis not present

## 2020-09-17 DIAGNOSIS — Z510 Encounter for antineoplastic radiation therapy: Secondary | ICD-10-CM | POA: Diagnosis not present

## 2020-09-17 DIAGNOSIS — C61 Malignant neoplasm of prostate: Secondary | ICD-10-CM | POA: Diagnosis not present

## 2020-09-17 DIAGNOSIS — Z191 Hormone sensitive malignancy status: Secondary | ICD-10-CM | POA: Diagnosis not present

## 2020-09-19 DIAGNOSIS — C61 Malignant neoplasm of prostate: Secondary | ICD-10-CM | POA: Diagnosis not present

## 2020-09-19 DIAGNOSIS — Z191 Hormone sensitive malignancy status: Secondary | ICD-10-CM | POA: Diagnosis not present

## 2020-09-19 DIAGNOSIS — Z510 Encounter for antineoplastic radiation therapy: Secondary | ICD-10-CM | POA: Diagnosis not present

## 2020-09-20 DIAGNOSIS — C61 Malignant neoplasm of prostate: Secondary | ICD-10-CM | POA: Diagnosis not present

## 2020-09-20 DIAGNOSIS — Z191 Hormone sensitive malignancy status: Secondary | ICD-10-CM | POA: Diagnosis not present

## 2020-09-20 DIAGNOSIS — Z510 Encounter for antineoplastic radiation therapy: Secondary | ICD-10-CM | POA: Diagnosis not present

## 2020-09-21 DIAGNOSIS — Z510 Encounter for antineoplastic radiation therapy: Secondary | ICD-10-CM | POA: Diagnosis not present

## 2020-09-21 DIAGNOSIS — Z191 Hormone sensitive malignancy status: Secondary | ICD-10-CM | POA: Diagnosis not present

## 2020-09-21 DIAGNOSIS — C61 Malignant neoplasm of prostate: Secondary | ICD-10-CM | POA: Diagnosis not present

## 2020-09-22 DIAGNOSIS — Z510 Encounter for antineoplastic radiation therapy: Secondary | ICD-10-CM | POA: Diagnosis not present

## 2020-09-22 DIAGNOSIS — C61 Malignant neoplasm of prostate: Secondary | ICD-10-CM | POA: Diagnosis not present

## 2020-09-22 DIAGNOSIS — Z191 Hormone sensitive malignancy status: Secondary | ICD-10-CM | POA: Diagnosis not present

## 2020-09-27 DIAGNOSIS — Z510 Encounter for antineoplastic radiation therapy: Secondary | ICD-10-CM | POA: Diagnosis not present

## 2020-09-27 DIAGNOSIS — C61 Malignant neoplasm of prostate: Secondary | ICD-10-CM | POA: Diagnosis not present

## 2020-09-27 DIAGNOSIS — Z191 Hormone sensitive malignancy status: Secondary | ICD-10-CM | POA: Diagnosis not present

## 2020-09-28 DIAGNOSIS — C61 Malignant neoplasm of prostate: Secondary | ICD-10-CM | POA: Diagnosis not present

## 2020-09-28 DIAGNOSIS — Z510 Encounter for antineoplastic radiation therapy: Secondary | ICD-10-CM | POA: Diagnosis not present

## 2020-09-28 DIAGNOSIS — Z191 Hormone sensitive malignancy status: Secondary | ICD-10-CM | POA: Diagnosis not present

## 2020-09-29 DIAGNOSIS — Z191 Hormone sensitive malignancy status: Secondary | ICD-10-CM | POA: Diagnosis not present

## 2020-09-29 DIAGNOSIS — Z510 Encounter for antineoplastic radiation therapy: Secondary | ICD-10-CM | POA: Diagnosis not present

## 2020-09-29 DIAGNOSIS — C61 Malignant neoplasm of prostate: Secondary | ICD-10-CM | POA: Diagnosis not present

## 2020-09-30 DIAGNOSIS — C61 Malignant neoplasm of prostate: Secondary | ICD-10-CM | POA: Diagnosis not present

## 2020-09-30 DIAGNOSIS — Z191 Hormone sensitive malignancy status: Secondary | ICD-10-CM | POA: Diagnosis not present

## 2020-09-30 DIAGNOSIS — Z510 Encounter for antineoplastic radiation therapy: Secondary | ICD-10-CM | POA: Diagnosis not present

## 2020-10-01 DIAGNOSIS — Z510 Encounter for antineoplastic radiation therapy: Secondary | ICD-10-CM | POA: Diagnosis not present

## 2020-10-01 DIAGNOSIS — Z5111 Encounter for antineoplastic chemotherapy: Secondary | ICD-10-CM | POA: Diagnosis not present

## 2020-10-01 DIAGNOSIS — C61 Malignant neoplasm of prostate: Secondary | ICD-10-CM | POA: Diagnosis not present

## 2020-10-01 DIAGNOSIS — Z191 Hormone sensitive malignancy status: Secondary | ICD-10-CM | POA: Diagnosis not present

## 2020-10-04 DIAGNOSIS — C61 Malignant neoplasm of prostate: Secondary | ICD-10-CM | POA: Diagnosis not present

## 2020-10-04 DIAGNOSIS — Z510 Encounter for antineoplastic radiation therapy: Secondary | ICD-10-CM | POA: Diagnosis not present

## 2020-10-04 DIAGNOSIS — Z191 Hormone sensitive malignancy status: Secondary | ICD-10-CM | POA: Diagnosis not present

## 2020-10-05 DIAGNOSIS — C61 Malignant neoplasm of prostate: Secondary | ICD-10-CM | POA: Diagnosis not present

## 2020-10-05 DIAGNOSIS — Z510 Encounter for antineoplastic radiation therapy: Secondary | ICD-10-CM | POA: Diagnosis not present

## 2020-10-05 DIAGNOSIS — Z191 Hormone sensitive malignancy status: Secondary | ICD-10-CM | POA: Diagnosis not present

## 2020-10-06 DIAGNOSIS — C61 Malignant neoplasm of prostate: Secondary | ICD-10-CM | POA: Diagnosis not present

## 2020-10-06 DIAGNOSIS — Z191 Hormone sensitive malignancy status: Secondary | ICD-10-CM | POA: Diagnosis not present

## 2020-10-06 DIAGNOSIS — Z510 Encounter for antineoplastic radiation therapy: Secondary | ICD-10-CM | POA: Diagnosis not present

## 2020-10-07 DIAGNOSIS — Z191 Hormone sensitive malignancy status: Secondary | ICD-10-CM | POA: Diagnosis not present

## 2020-10-07 DIAGNOSIS — C61 Malignant neoplasm of prostate: Secondary | ICD-10-CM | POA: Diagnosis not present

## 2020-10-07 DIAGNOSIS — Z510 Encounter for antineoplastic radiation therapy: Secondary | ICD-10-CM | POA: Diagnosis not present

## 2020-10-08 DIAGNOSIS — Z191 Hormone sensitive malignancy status: Secondary | ICD-10-CM | POA: Diagnosis not present

## 2020-10-08 DIAGNOSIS — Z510 Encounter for antineoplastic radiation therapy: Secondary | ICD-10-CM | POA: Diagnosis not present

## 2020-10-08 DIAGNOSIS — C61 Malignant neoplasm of prostate: Secondary | ICD-10-CM | POA: Diagnosis not present

## 2020-10-11 DIAGNOSIS — Z510 Encounter for antineoplastic radiation therapy: Secondary | ICD-10-CM | POA: Diagnosis not present

## 2020-10-11 DIAGNOSIS — C61 Malignant neoplasm of prostate: Secondary | ICD-10-CM | POA: Diagnosis not present

## 2020-10-11 DIAGNOSIS — Z191 Hormone sensitive malignancy status: Secondary | ICD-10-CM | POA: Diagnosis not present

## 2020-10-12 ENCOUNTER — Other Ambulatory Visit: Payer: Self-pay | Admitting: Oncology

## 2020-10-12 DIAGNOSIS — C61 Malignant neoplasm of prostate: Secondary | ICD-10-CM | POA: Diagnosis not present

## 2020-10-12 DIAGNOSIS — D45 Polycythemia vera: Secondary | ICD-10-CM

## 2020-10-12 DIAGNOSIS — Z510 Encounter for antineoplastic radiation therapy: Secondary | ICD-10-CM | POA: Diagnosis not present

## 2020-10-12 DIAGNOSIS — Z191 Hormone sensitive malignancy status: Secondary | ICD-10-CM | POA: Diagnosis not present

## 2020-10-12 NOTE — Telephone Encounter (Signed)
sent 

## 2020-10-13 DIAGNOSIS — Z191 Hormone sensitive malignancy status: Secondary | ICD-10-CM | POA: Diagnosis not present

## 2020-10-13 DIAGNOSIS — Z510 Encounter for antineoplastic radiation therapy: Secondary | ICD-10-CM | POA: Diagnosis not present

## 2020-10-13 DIAGNOSIS — C61 Malignant neoplasm of prostate: Secondary | ICD-10-CM | POA: Diagnosis not present

## 2020-10-14 DIAGNOSIS — C61 Malignant neoplasm of prostate: Secondary | ICD-10-CM | POA: Diagnosis not present

## 2020-10-14 DIAGNOSIS — Z510 Encounter for antineoplastic radiation therapy: Secondary | ICD-10-CM | POA: Diagnosis not present

## 2020-10-14 DIAGNOSIS — Z191 Hormone sensitive malignancy status: Secondary | ICD-10-CM | POA: Diagnosis not present

## 2020-10-15 DIAGNOSIS — C61 Malignant neoplasm of prostate: Secondary | ICD-10-CM | POA: Diagnosis not present

## 2020-10-15 DIAGNOSIS — Z191 Hormone sensitive malignancy status: Secondary | ICD-10-CM | POA: Diagnosis not present

## 2020-10-15 DIAGNOSIS — Z510 Encounter for antineoplastic radiation therapy: Secondary | ICD-10-CM | POA: Diagnosis not present

## 2020-10-19 DIAGNOSIS — C61 Malignant neoplasm of prostate: Secondary | ICD-10-CM | POA: Diagnosis not present

## 2020-10-19 DIAGNOSIS — Z191 Hormone sensitive malignancy status: Secondary | ICD-10-CM | POA: Diagnosis not present

## 2020-10-19 DIAGNOSIS — Z510 Encounter for antineoplastic radiation therapy: Secondary | ICD-10-CM | POA: Diagnosis not present

## 2020-10-20 DIAGNOSIS — Z510 Encounter for antineoplastic radiation therapy: Secondary | ICD-10-CM | POA: Diagnosis not present

## 2020-10-20 DIAGNOSIS — Z191 Hormone sensitive malignancy status: Secondary | ICD-10-CM | POA: Diagnosis not present

## 2020-10-20 DIAGNOSIS — C61 Malignant neoplasm of prostate: Secondary | ICD-10-CM | POA: Diagnosis not present

## 2020-10-21 DIAGNOSIS — Z191 Hormone sensitive malignancy status: Secondary | ICD-10-CM | POA: Diagnosis not present

## 2020-10-21 DIAGNOSIS — C61 Malignant neoplasm of prostate: Secondary | ICD-10-CM | POA: Diagnosis not present

## 2020-10-21 DIAGNOSIS — Z510 Encounter for antineoplastic radiation therapy: Secondary | ICD-10-CM | POA: Diagnosis not present

## 2020-10-25 DIAGNOSIS — Z510 Encounter for antineoplastic radiation therapy: Secondary | ICD-10-CM | POA: Diagnosis not present

## 2020-10-25 DIAGNOSIS — C61 Malignant neoplasm of prostate: Secondary | ICD-10-CM | POA: Diagnosis not present

## 2020-10-25 DIAGNOSIS — Z191 Hormone sensitive malignancy status: Secondary | ICD-10-CM | POA: Diagnosis not present

## 2020-10-26 DIAGNOSIS — Z191 Hormone sensitive malignancy status: Secondary | ICD-10-CM | POA: Diagnosis not present

## 2020-10-26 DIAGNOSIS — C61 Malignant neoplasm of prostate: Secondary | ICD-10-CM | POA: Diagnosis not present

## 2020-10-26 DIAGNOSIS — Z510 Encounter for antineoplastic radiation therapy: Secondary | ICD-10-CM | POA: Diagnosis not present

## 2020-10-27 DIAGNOSIS — Z191 Hormone sensitive malignancy status: Secondary | ICD-10-CM | POA: Diagnosis not present

## 2020-10-27 DIAGNOSIS — Z510 Encounter for antineoplastic radiation therapy: Secondary | ICD-10-CM | POA: Diagnosis not present

## 2020-10-27 DIAGNOSIS — C61 Malignant neoplasm of prostate: Secondary | ICD-10-CM | POA: Diagnosis not present

## 2020-10-28 DIAGNOSIS — Z510 Encounter for antineoplastic radiation therapy: Secondary | ICD-10-CM | POA: Diagnosis not present

## 2020-10-28 DIAGNOSIS — Z191 Hormone sensitive malignancy status: Secondary | ICD-10-CM | POA: Diagnosis not present

## 2020-10-28 DIAGNOSIS — C61 Malignant neoplasm of prostate: Secondary | ICD-10-CM | POA: Diagnosis not present

## 2020-11-01 DIAGNOSIS — Z510 Encounter for antineoplastic radiation therapy: Secondary | ICD-10-CM | POA: Diagnosis not present

## 2020-11-01 DIAGNOSIS — C61 Malignant neoplasm of prostate: Secondary | ICD-10-CM | POA: Diagnosis not present

## 2020-11-01 DIAGNOSIS — Z191 Hormone sensitive malignancy status: Secondary | ICD-10-CM | POA: Diagnosis not present

## 2020-11-02 DIAGNOSIS — Z510 Encounter for antineoplastic radiation therapy: Secondary | ICD-10-CM | POA: Diagnosis not present

## 2020-11-02 DIAGNOSIS — Z191 Hormone sensitive malignancy status: Secondary | ICD-10-CM | POA: Diagnosis not present

## 2020-11-02 DIAGNOSIS — C61 Malignant neoplasm of prostate: Secondary | ICD-10-CM | POA: Diagnosis not present

## 2020-11-03 DIAGNOSIS — C61 Malignant neoplasm of prostate: Secondary | ICD-10-CM | POA: Diagnosis not present

## 2020-11-03 DIAGNOSIS — Z191 Hormone sensitive malignancy status: Secondary | ICD-10-CM | POA: Diagnosis not present

## 2020-11-03 DIAGNOSIS — Z510 Encounter for antineoplastic radiation therapy: Secondary | ICD-10-CM | POA: Diagnosis not present

## 2020-11-04 DIAGNOSIS — Z510 Encounter for antineoplastic radiation therapy: Secondary | ICD-10-CM | POA: Diagnosis not present

## 2020-11-04 DIAGNOSIS — C61 Malignant neoplasm of prostate: Secondary | ICD-10-CM | POA: Diagnosis not present

## 2020-11-04 DIAGNOSIS — Z191 Hormone sensitive malignancy status: Secondary | ICD-10-CM | POA: Diagnosis not present

## 2020-11-05 DIAGNOSIS — C61 Malignant neoplasm of prostate: Secondary | ICD-10-CM | POA: Diagnosis not present

## 2020-11-05 DIAGNOSIS — Z191 Hormone sensitive malignancy status: Secondary | ICD-10-CM | POA: Diagnosis not present

## 2020-11-05 DIAGNOSIS — Z510 Encounter for antineoplastic radiation therapy: Secondary | ICD-10-CM | POA: Diagnosis not present

## 2020-11-08 DIAGNOSIS — Z510 Encounter for antineoplastic radiation therapy: Secondary | ICD-10-CM | POA: Diagnosis not present

## 2020-11-08 DIAGNOSIS — Z191 Hormone sensitive malignancy status: Secondary | ICD-10-CM | POA: Diagnosis not present

## 2020-11-08 DIAGNOSIS — C61 Malignant neoplasm of prostate: Secondary | ICD-10-CM | POA: Diagnosis not present

## 2020-11-09 DIAGNOSIS — Z510 Encounter for antineoplastic radiation therapy: Secondary | ICD-10-CM | POA: Diagnosis not present

## 2020-11-09 DIAGNOSIS — C61 Malignant neoplasm of prostate: Secondary | ICD-10-CM | POA: Diagnosis not present

## 2020-11-09 DIAGNOSIS — Z191 Hormone sensitive malignancy status: Secondary | ICD-10-CM | POA: Diagnosis not present

## 2020-11-10 DIAGNOSIS — Z191 Hormone sensitive malignancy status: Secondary | ICD-10-CM | POA: Diagnosis not present

## 2020-11-10 DIAGNOSIS — C61 Malignant neoplasm of prostate: Secondary | ICD-10-CM | POA: Diagnosis not present

## 2020-11-10 DIAGNOSIS — Z510 Encounter for antineoplastic radiation therapy: Secondary | ICD-10-CM | POA: Diagnosis not present

## 2020-11-11 ENCOUNTER — Inpatient Hospital Stay: Payer: Medicare Other

## 2020-11-11 ENCOUNTER — Inpatient Hospital Stay: Payer: Medicare Other | Admitting: Hematology and Oncology

## 2020-11-11 ENCOUNTER — Ambulatory Visit: Payer: Medicare Other | Admitting: Oncology

## 2020-11-11 ENCOUNTER — Other Ambulatory Visit: Payer: Medicare Other

## 2020-11-11 DIAGNOSIS — Z191 Hormone sensitive malignancy status: Secondary | ICD-10-CM | POA: Diagnosis not present

## 2020-11-11 DIAGNOSIS — C61 Malignant neoplasm of prostate: Secondary | ICD-10-CM | POA: Diagnosis not present

## 2020-11-11 DIAGNOSIS — Z510 Encounter for antineoplastic radiation therapy: Secondary | ICD-10-CM | POA: Diagnosis not present

## 2020-11-12 ENCOUNTER — Inpatient Hospital Stay: Payer: Medicare Other | Admitting: Oncology

## 2020-11-12 ENCOUNTER — Other Ambulatory Visit: Payer: Self-pay | Admitting: Hematology and Oncology

## 2020-11-12 ENCOUNTER — Inpatient Hospital Stay: Payer: Medicare Other

## 2020-11-12 DIAGNOSIS — D45 Polycythemia vera: Secondary | ICD-10-CM

## 2020-11-15 ENCOUNTER — Inpatient Hospital Stay: Payer: Medicare Other

## 2020-11-15 NOTE — Progress Notes (Deleted)
Leonard Washington  [address] [place],  [place]  [address] [phone]  Clinic Day:  11/15/2020  Referring physician: Serita Grammes, MD   CHIEF COMPLAINT:  CC:   Polycythemia rubra vera  Current Treatment:  Hydroxyurea 500 mg daily, as well as intermittent phlebotomy.   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  He receives phlebotomies to keep his hematocrit under 45.  He is also on hydroxyurea 500 mg daily to keep his white cells and platelets from becoming more elevated over time.  He also has prostate cancer and was awaiting definitive treatment when we saw him in September.  INTERVAL HISTORY:  Leonard Washington is here today to reassess his peripheral counts.  With respect to his polycythemia rubra vera, he denies having any hyperviscosity-related symptoms from this underlying condition.   He denies fevers or chills. He  denies pain. His appetite is good. His weight {Weight change:10426}.  REVIEW OF SYSTEMS:  Review of Systems - Oncology   VITALS:  There were no vitals taken for this visit.  Wt Readings from Last 3 Encounters:  07/07/20 260 lb (117.9 kg)  02/05/20 247 lb 12.8 oz (112.4 kg)  01/29/20 240 lb (108.9 kg)    There is no height or weight on file to calculate BMI.  Performance status (ECOG): {CHL ONC [id number]  PHYSICAL EXAM:  Physical Exam Lymph nodes:   There is no cervical, clavicular, axillary or inguinal lymphadenopathy.   LABS:   CBC Latest Ref Rng & Units 07/01/2020 01/07/2020 01/06/2020  WBC 3.4 - 10.8 x10E3/uL 12.4(H) 16.7(H) 18.3(H)  Hemoglobin 13.0 - 17.7 g/dL 16.9 15.6 15.2  Hematocrit 37.5 - 51.0 % 54.3(H) 52.2(H) 52.4(H)  Platelets 150 - 450 x10E3/uL 381 574(H) 532(H)   CMP Latest Ref Rng & Units 07/01/2020 01/07/2020 01/06/2020  Glucose 65 - 99 mg/dL 118(H) 86 83  BUN 8 - 27 mg/dL 13 22 21   Creatinine 0.76 - 1.27 mg/dL 1.09 0.90 0.85  Sodium 134 - 144  mmol/L 142 139 140  Potassium 3.5 - 5.2 mmol/L 5.0 4.4 4.0  Chloride 96 - 106 mmol/L 101 102 107  CO2 20 - 29 mmol/L 27 27 23   Calcium 8.6 - 10.2 mg/dL 9.6 8.8(L) 8.3(L)  Total Protein 6.0 - 8.5 g/dL 6.4 5.8(L) 5.4(L)  Total Bilirubin 0.0 - 1.2 mg/dL 0.6 1.1 0.6  Alkaline Phos 48 - 121 IU/L 99 85 71  AST 0 - 40 IU/L 20 54(H) 66(H)  ALT 0 - 44 IU/L 17 51(H) 58(H)     No results found for: CEA1 / No results found for: CEA1 No results found for: PSA1 No results found for: VQM086 No results found for: CAN125  No results found for: Ronnald Ramp, A1GS, A2GS, Tillman Sers, SPEI Lab Results  Component Value Date   FERRITIN 132 01/05/2020   FERRITIN 164 01/04/2020   FERRITIN 171 01/03/2020   Lab Results  Component Value Date   LDH 265 (H) 01/02/2020    STUDIES:  No results found.    HISTORY:   Past Medical History:  Diagnosis Date  . BPH (benign prostatic hyperplasia)   . Cataract 2016   Had Bil surgery  . Chest congestion 11/29/2018  . Coronary atherosclerosis of native coronary artery    Vessel CABG 2005  . Hyperlipidemia   . Hypertension   . Myocardial infarction [facility]) 1997   Had stent put in 197/CABG in2005  . Shoulder  pain    right  side  . SOB (shortness of breath) on exertion     Past Surgical History:  Procedure Laterality Date  . ARTERIAL DUPLEX  04/19/2004   Carotid-no evidence of significant plaque noted, no evidence of significant ICA stenosis, artery flow is antegrade. Upper extremities-within normal limits. Lower extremities-no evidence of lower extremity arterial occlusive disease at rest  . BLADDER SURGERY     had a growth in bladder/benign  . CARDIAC CATHETERIZATION  04/19/2004   severe four vessel coronary disease, continue vigorous medical therapy, recommendations for multivessel bypass grafting  . CARDIOVASCULAR STRESS TEST  07/01/2009   normal pattern of perfusion in all regions, no scintigraphic evidence of inducible  myocardial ischemia,EKG negative for ischemia, no ECG changes,  . CATARACT EXTRACTION, BILATERAL    . CORONARY ARTERY BYPASS GRAFT  04/20/2004   x4. LIMA to LAD, left radial artery to RCA, SVG to ramus intermedius, SVG to circ.  Marland Kitchen DOPPLER ECHOCARDIOGRAPHY  11/11/2012   EF 55-60%, wall motion normal, no regional wall motion abnormalities, thickness of ventricular septum was mildly increased, mild regurg of the mitral valve    Family History  Problem Relation Age of Onset  . Heart attack Maternal Grandfather   . Heart attack Maternal Uncle   . Colon cancer Maternal Uncle   . Heart attack Maternal Uncle   . Heart attack Maternal Uncle   . Heart disease Sister     Social History:  reports that he quit smoking about 41 years ago. He has never used smokeless tobacco. He reports current alcohol use of about 12.0 standard drinks of alcohol per week. He reports that he does not use drugs.The patient is {Blank single:19197::"alone","accompanied by"} *** today.  Allergies:  Allergies  Allergen Reactions  . Zocor [Simvastatin]     Made fingers numb    Current Medications: Current Outpatient Medications  Medication Sig Dispense Refill  . aspirin EC 81 MG tablet Take 81 mg by mouth daily.     Marland Kitchen diltiazem (CARDIZEM CD) 120 MG 24 hr capsule Take 1 capsule (120 mg total) by mouth daily. 90 capsule 3  . hydroxyurea (HYDREA) 500 MG capsule TAKE ONE CAPSULE BY MOUTH EVERY DAY 90 capsule 0  . metoprolol tartrate (LOPRESSOR) 25 MG tablet Take 1 tablet (25 mg total) by mouth in the morning. 30 tablet 11  . rosuvastatin (CRESTOR) 40 MG tablet TAKE 1 TABLET(40 MG) BY MOUTH DAILY 90 tablet 3  . triamcinolone cream (KENALOG) 0.5 % Apply 1 application topically as needed (rash).      No current facility-administered medications for this visit.     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     His white count and platelets continue to  improve. As his hematocrit remains above 45, will arrange for him to be phlebotomized today. We will obtain labs in 2 months and have phlebotomy if needed at a facility closer to home. He knows to continue taking hydroxyurea 500 mg daily.  He also knows to continue taking a baby aspirin on a daily basis to offset any potential clotting complications his polycythemia rubra vera can cause.  We will plan to see him back in 4 months for clinical reassessment.   The patient understands the plans discussed today and is in agreement with them.  He knows to contact our office if he develops concerns prior to his next appointment.   I provided ***  minutes (5:01 PM - 5:01 PM) of face-to-face time during this this encounter and > 50% was spent counseling as documented under my assessment and plan.    Marvia Pickles, PA-C

## 2020-11-16 ENCOUNTER — Inpatient Hospital Stay: Payer: Medicare Other | Admitting: Hematology and Oncology

## 2020-11-16 ENCOUNTER — Inpatient Hospital Stay: Payer: Medicare Other

## 2020-11-16 ENCOUNTER — Telehealth: Payer: Self-pay | Admitting: Hematology and Oncology

## 2020-11-16 DIAGNOSIS — Z191 Hormone sensitive malignancy status: Secondary | ICD-10-CM | POA: Diagnosis not present

## 2020-11-16 DIAGNOSIS — C61 Malignant neoplasm of prostate: Secondary | ICD-10-CM | POA: Diagnosis not present

## 2020-11-16 DIAGNOSIS — Z510 Encounter for antineoplastic radiation therapy: Secondary | ICD-10-CM | POA: Diagnosis not present

## 2020-11-16 DIAGNOSIS — D45 Polycythemia vera: Secondary | ICD-10-CM

## 2020-11-16 NOTE — Telephone Encounter (Signed)
Patient called to reschedule his Appt's until after he has completed his Radiation Treatiments.  Patient rescheduled to 2/7 Labs, Follow Up w/Dr Bobby Rumpf, 2/8 Phlebotomy Therapy

## 2020-11-17 DIAGNOSIS — Z191 Hormone sensitive malignancy status: Secondary | ICD-10-CM | POA: Diagnosis not present

## 2020-11-17 DIAGNOSIS — C61 Malignant neoplasm of prostate: Secondary | ICD-10-CM | POA: Diagnosis not present

## 2020-11-17 DIAGNOSIS — Z510 Encounter for antineoplastic radiation therapy: Secondary | ICD-10-CM | POA: Diagnosis not present

## 2020-11-18 DIAGNOSIS — Z191 Hormone sensitive malignancy status: Secondary | ICD-10-CM | POA: Diagnosis not present

## 2020-11-18 DIAGNOSIS — C61 Malignant neoplasm of prostate: Secondary | ICD-10-CM | POA: Diagnosis not present

## 2020-11-18 DIAGNOSIS — Z510 Encounter for antineoplastic radiation therapy: Secondary | ICD-10-CM | POA: Diagnosis not present

## 2020-11-19 ENCOUNTER — Inpatient Hospital Stay: Payer: Medicare Other

## 2020-11-19 DIAGNOSIS — Z510 Encounter for antineoplastic radiation therapy: Secondary | ICD-10-CM | POA: Diagnosis not present

## 2020-11-19 DIAGNOSIS — Z191 Hormone sensitive malignancy status: Secondary | ICD-10-CM | POA: Diagnosis not present

## 2020-11-19 DIAGNOSIS — C61 Malignant neoplasm of prostate: Secondary | ICD-10-CM | POA: Diagnosis not present

## 2020-11-22 DIAGNOSIS — C61 Malignant neoplasm of prostate: Secondary | ICD-10-CM | POA: Diagnosis not present

## 2020-11-22 DIAGNOSIS — Z191 Hormone sensitive malignancy status: Secondary | ICD-10-CM | POA: Diagnosis not present

## 2020-11-22 DIAGNOSIS — Z510 Encounter for antineoplastic radiation therapy: Secondary | ICD-10-CM | POA: Diagnosis not present

## 2020-11-23 DIAGNOSIS — Z510 Encounter for antineoplastic radiation therapy: Secondary | ICD-10-CM | POA: Diagnosis not present

## 2020-11-23 DIAGNOSIS — C61 Malignant neoplasm of prostate: Secondary | ICD-10-CM | POA: Diagnosis not present

## 2020-11-23 DIAGNOSIS — Z191 Hormone sensitive malignancy status: Secondary | ICD-10-CM | POA: Diagnosis not present

## 2020-11-24 DIAGNOSIS — Z510 Encounter for antineoplastic radiation therapy: Secondary | ICD-10-CM | POA: Diagnosis not present

## 2020-11-24 DIAGNOSIS — Z191 Hormone sensitive malignancy status: Secondary | ICD-10-CM | POA: Diagnosis not present

## 2020-11-24 DIAGNOSIS — C61 Malignant neoplasm of prostate: Secondary | ICD-10-CM | POA: Diagnosis not present

## 2020-11-30 DIAGNOSIS — M2550 Pain in unspecified joint: Secondary | ICD-10-CM | POA: Diagnosis not present

## 2020-11-30 DIAGNOSIS — I480 Paroxysmal atrial fibrillation: Secondary | ICD-10-CM | POA: Diagnosis not present

## 2020-11-30 DIAGNOSIS — I10 Essential (primary) hypertension: Secondary | ICD-10-CM | POA: Diagnosis not present

## 2020-11-30 DIAGNOSIS — C61 Malignant neoplasm of prostate: Secondary | ICD-10-CM | POA: Diagnosis not present

## 2020-12-06 ENCOUNTER — Inpatient Hospital Stay: Payer: Medicare Other | Attending: Hematology and Oncology

## 2020-12-06 ENCOUNTER — Telehealth: Payer: Self-pay | Admitting: Oncology

## 2020-12-06 ENCOUNTER — Other Ambulatory Visit: Payer: Self-pay | Admitting: Oncology

## 2020-12-06 ENCOUNTER — Other Ambulatory Visit: Payer: Self-pay

## 2020-12-06 ENCOUNTER — Other Ambulatory Visit: Payer: Self-pay | Admitting: Hematology and Oncology

## 2020-12-06 ENCOUNTER — Inpatient Hospital Stay (INDEPENDENT_AMBULATORY_CARE_PROVIDER_SITE_OTHER): Payer: Medicare Other | Admitting: Oncology

## 2020-12-06 VITALS — BP 192/87 | HR 59 | Temp 97.9°F | Resp 16 | Ht 71.0 in | Wt 265.5 lb

## 2020-12-06 DIAGNOSIS — D649 Anemia, unspecified: Secondary | ICD-10-CM | POA: Diagnosis not present

## 2020-12-06 DIAGNOSIS — W19XXXA Unspecified fall, initial encounter: Secondary | ICD-10-CM | POA: Diagnosis not present

## 2020-12-06 DIAGNOSIS — D45 Polycythemia vera: Secondary | ICD-10-CM

## 2020-12-06 DIAGNOSIS — Z23 Encounter for immunization: Secondary | ICD-10-CM | POA: Diagnosis not present

## 2020-12-06 DIAGNOSIS — S51812A Laceration without foreign body of left forearm, initial encounter: Secondary | ICD-10-CM | POA: Diagnosis not present

## 2020-12-06 DIAGNOSIS — Z6835 Body mass index (BMI) 35.0-35.9, adult: Secondary | ICD-10-CM | POA: Diagnosis not present

## 2020-12-06 LAB — CBC AND DIFFERENTIAL
HCT: 51 (ref 41–53)
Hemoglobin: 16.6 (ref 13.5–17.5)
Neutrophils Absolute: 5.02
Platelets: 189 (ref 150–399)
WBC: 6.6

## 2020-12-06 LAB — CBC
MCV: 91 (ref 80–94)
RBC: 5.54 — AB (ref 3.87–5.11)

## 2020-12-06 NOTE — Telephone Encounter (Signed)
12/06/20 Spoke with patient and cancelled appt

## 2020-12-06 NOTE — Telephone Encounter (Signed)
Per 2/7 los next appt sched and given to patient 

## 2020-12-06 NOTE — Progress Notes (Signed)
[facility]  [address] [place],  [place]  [address] [phone]  Clinic Day:  12/06/2020  Referring physician: Serita Grammes, MD   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  He receives intermittent phlebotomies to keep his hematocrit under 45.  He is also on hydroxyurea 500 mg daily to keep his white cells and platelets from becoming more elevated over time.  He comes in today to reassess his peripheral counts.  Since his last visit, the patient has been doing fairly well.  With respect to his polycythemia rubra vera, he denies having any hyperviscosity-related symptoms from this underlying condition.  PHYSICAL EXAM:  Blood pressure (!) 192/87, pulse (!) 59, temperature 97.9 F (36.6 C), resp. rate 16, height 5\' 11"  (1.803 m), weight 265 lb 8 oz (120.4 kg), SpO2 97 %. Wt Readings from Last 3 Encounters:  12/06/20 265 lb 8 oz (120.4 kg)  07/07/20 260 lb (117.9 kg)  02/05/20 247 lb 12.8 oz (112.4 kg)   Body mass index is 37.03 kg/m. Performance status (ECOG): 0 Physical Exam Constitutional:      Appearance: Normal appearance. He is not ill-appearing.  HENT:     Mouth/Throat:     Mouth: Mucous membranes are moist.     Pharynx: Oropharynx is clear. No oropharyngeal exudate or posterior oropharyngeal erythema.  Cardiovascular:     Rate and Rhythm: Normal rate and regular rhythm.     Heart sounds: No murmur heard. No friction rub. No gallop.   Pulmonary:     Effort: Pulmonary effort is normal. No respiratory distress.     Breath sounds: Normal breath sounds. No wheezing, rhonchi or rales.  Chest:  Breasts:     Right: No axillary adenopathy or supraclavicular adenopathy.     Left: No axillary adenopathy or supraclavicular adenopathy.    Abdominal:     General: Bowel sounds are normal. There is no distension.     Palpations: Abdomen is soft. There is no mass.     Tenderness: There is no  abdominal tenderness.  Musculoskeletal:        General: No swelling.     Right lower leg: No edema.     Left lower leg: No edema.  Lymphadenopathy:     Cervical: No cervical adenopathy.     Upper Body:     Right upper body: No supraclavicular or axillary adenopathy.     Left upper body: No supraclavicular or axillary adenopathy.     Lower Body: No right inguinal adenopathy. No left inguinal adenopathy.  Skin:    General: Skin is warm.     Coloration: Skin is not jaundiced.     Findings: No lesion or rash.  Neurological:     General: No focal deficit present.     Mental Status: He is alert and oriented to person, place, and time. Mental status is at baseline.     Cranial Nerves: Cranial nerves are intact.  Psychiatric:        Mood and Affect: Mood normal.        Behavior: Behavior normal.        Thought Content: Thought content normal.     LABS:    ASSESSMENT & PLAN:  Assessment/Plan:  A 75 y.o. 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  As his hematocrit remains well above 45, I will arrange for him to be phlebotomized today and in 2 months.  I am pleased with the  levels of his white cells and platelets.  He knows to continue taking hydroxyurea 500 mg daily.  He also knows to continue taking a baby aspirin on a daily basis to offset any potential clotting complications his polycythemia rubra vera can cause.  I will see him back in 4 months for repeat clinical assessment.  .The patient understands all the plans discussed today and is in agreement with them.      Gabriela Irigoyen Macarthur Critchley, MD

## 2020-12-07 ENCOUNTER — Inpatient Hospital Stay: Payer: Medicare Other

## 2020-12-10 DIAGNOSIS — S51812A Laceration without foreign body of left forearm, initial encounter: Secondary | ICD-10-CM | POA: Diagnosis not present

## 2020-12-10 DIAGNOSIS — Z6835 Body mass index (BMI) 35.0-35.9, adult: Secondary | ICD-10-CM | POA: Diagnosis not present

## 2020-12-16 DIAGNOSIS — S51812A Laceration without foreign body of left forearm, initial encounter: Secondary | ICD-10-CM | POA: Diagnosis not present

## 2020-12-16 DIAGNOSIS — Z6835 Body mass index (BMI) 35.0-35.9, adult: Secondary | ICD-10-CM | POA: Diagnosis not present

## 2020-12-16 DIAGNOSIS — Z79899 Other long term (current) drug therapy: Secondary | ICD-10-CM | POA: Diagnosis not present

## 2020-12-27 DIAGNOSIS — I10 Essential (primary) hypertension: Secondary | ICD-10-CM | POA: Diagnosis not present

## 2020-12-27 DIAGNOSIS — I480 Paroxysmal atrial fibrillation: Secondary | ICD-10-CM | POA: Diagnosis not present

## 2020-12-30 DIAGNOSIS — Z6836 Body mass index (BMI) 36.0-36.9, adult: Secondary | ICD-10-CM | POA: Diagnosis not present

## 2020-12-30 DIAGNOSIS — S51812A Laceration without foreign body of left forearm, initial encounter: Secondary | ICD-10-CM | POA: Diagnosis not present

## 2021-01-05 ENCOUNTER — Ambulatory Visit: Payer: Medicare Other | Admitting: Cardiovascular Disease

## 2021-01-05 ENCOUNTER — Other Ambulatory Visit: Payer: Self-pay

## 2021-01-05 ENCOUNTER — Encounter: Payer: Self-pay | Admitting: Cardiovascular Disease

## 2021-01-05 VITALS — BP 120/90 | HR 74 | Ht 71.0 in | Wt 264.8 lb

## 2021-01-05 DIAGNOSIS — I480 Paroxysmal atrial fibrillation: Secondary | ICD-10-CM

## 2021-01-05 DIAGNOSIS — E782 Mixed hyperlipidemia: Secondary | ICD-10-CM | POA: Diagnosis not present

## 2021-01-05 DIAGNOSIS — I2510 Atherosclerotic heart disease of native coronary artery without angina pectoris: Secondary | ICD-10-CM | POA: Diagnosis not present

## 2021-01-05 DIAGNOSIS — I10 Essential (primary) hypertension: Secondary | ICD-10-CM | POA: Diagnosis not present

## 2021-01-05 LAB — COMPREHENSIVE METABOLIC PANEL
ALT: 19 IU/L (ref 0–44)
AST: 25 IU/L (ref 0–40)
Albumin/Globulin Ratio: 1.7 (ref 1.2–2.2)
Albumin: 4.3 g/dL (ref 3.7–4.7)
Alkaline Phosphatase: 84 IU/L (ref 44–121)
BUN/Creatinine Ratio: 15 (ref 10–24)
BUN: 12 mg/dL (ref 8–27)
Bilirubin Total: 0.8 mg/dL (ref 0.0–1.2)
CO2: 23 mmol/L (ref 20–29)
Calcium: 9.6 mg/dL (ref 8.6–10.2)
Chloride: 101 mmol/L (ref 96–106)
Creatinine, Ser: 0.8 mg/dL (ref 0.76–1.27)
Globulin, Total: 2.5 g/dL (ref 1.5–4.5)
Glucose: 93 mg/dL (ref 65–99)
Potassium: 4.4 mmol/L (ref 3.5–5.2)
Sodium: 141 mmol/L (ref 134–144)
Total Protein: 6.8 g/dL (ref 6.0–8.5)
eGFR: 92 mL/min/{1.73_m2} (ref >59)

## 2021-01-05 LAB — LIPID PANEL
Chol/HDL Ratio: 2.6 ratio (ref 0.0–5.0)
Cholesterol, Total: 166 mg/dL (ref 100–199)
HDL: 63 mg/dL (ref >39)
LDL Chol Calc (NIH): 82 mg/dL (ref 0–99)
Triglycerides: 119 mg/dL (ref 0–149)
VLDL Cholesterol Cal: 21 mg/dL (ref 5–40)

## 2021-01-05 NOTE — Patient Instructions (Signed)
Medication Instructions:  Your provider recommends that you continue on your current medications as directed. Please refer to the Current Medication list given to you today.   *If you need a refill on your cardiac medications before your next appointment, please call your pharmacy*  Lab Work: TODAY! CMET, lipids If you have labs (blood work) drawn today and your tests are completely normal, you will receive your results only by: Marland Kitchen MyChart Message (if you have MyChart) OR . A paper copy in the mail If you have any lab test that is abnormal or we need to change your treatment, we will call you to review the results.  Follow-Up: At [facility], you and your health needs are our priority.  As part of our continuing mission to provide you with exceptional heart care, we have created designated Provider Care Teams.  These Care Teams include your primary Cardiologist (physician) and Advanced Practice Providers (APPs -  Physician Assistants and Nurse Practitioners) who all work together to provide you with the care you need, when you need it. Your next appointment:   12 month(s) The format for your next appointment:   In Person Provider:   You may see Sherren Mocha, MD or one of the following Advanced Practice Providers on your designated Care Team:    Richardson Dopp, PA-C  [place], [place]

## 2021-01-05 NOTE — Progress Notes (Signed)
Cardiology Office Note:    Date:  01/05/2021   ID:  Leonard Washington, DOB 04/27/1946, MRN [id number]  PCP:  Serita Grammes, [place]  Cardiologist:  Sherren Mocha, MD  Advanced Practice Provider:  No care team member to display Electrophysiologist:  None       Referring MD: Serita Grammes, MD   Chief Complaint  Patient presents with  . Coronary Artery Disease    History of Present Illness:    Leonard Washington is a 75 y.o. male with a hx of coronary artery disease status post remote CABG, presenting for follow-up evaluation.  The patient underwent four-vessel CABG in 2005 with a LIMA to LAD, left radial to RCA, saphenous vein graft to ramus intermedius, and saphenous vein graft to left circumflex marginal.  Comorbid conditions include hypertension and mixed hyperlipidemia.  Noncardiac issues include prostate cancer and polycythemia vera.  The patient was hospitalized in March 2021 with COVID-19 pneumonia.  His illness was complicated by demand ischemia and atrial fibrillation.  He was anticoagulated with apixaban and converted spontaneously back to sinus rhythm.  Outpatient event monitoring once he recovered demonstrated no recurrence of atrial fibrillation.  He has been transitioned back to antiplatelet therapy with aspirin 81 mg daily.  He underwent a stress Myoview scan demonstrating no ischemia.  He presents today for follow-up evaluation.  The patient is here alone today.  He has been doing well and has no specific complaints today.  He denies chest pain, shortness of breath, edema, orthopnea, or PND.  He is compliant with his medications.  He comes in fasting today for his annual labs.  Past Medical History:  Diagnosis Date  . BPH (benign prostatic hyperplasia)   . Cataract 2016   Had Bil surgery  . Chest congestion 11/29/2018  . Coronary atherosclerosis of native coronary artery    Vessel CABG 2005  . Hyperlipidemia   .  Hypertension   . Myocardial infarction [facility]) 1997   Had stent put in 197/CABG in2005  . Shoulder pain    right  side  . SOB (shortness of breath) on exertion     Past Surgical History:  Procedure Laterality Date  . ARTERIAL DUPLEX  04/19/2004   Carotid-no evidence of significant plaque noted, no evidence of significant ICA stenosis, artery flow is antegrade. Upper extremities-within normal limits. Lower extremities-no evidence of lower extremity arterial occlusive disease at rest  . BLADDER SURGERY     had a growth in bladder/benign  . CARDIAC CATHETERIZATION  04/19/2004   severe four vessel coronary disease, continue vigorous medical therapy, recommendations for multivessel bypass grafting  . CARDIOVASCULAR STRESS TEST  07/01/2009   normal pattern of perfusion in all regions, no scintigraphic evidence of inducible myocardial ischemia,EKG negative for ischemia, no ECG changes,  . CATARACT EXTRACTION, BILATERAL    . CORONARY ARTERY BYPASS GRAFT  04/20/2004   x4. LIMA to LAD, left radial artery to RCA, SVG to ramus intermedius, SVG to circ.  Marland Kitchen DOPPLER ECHOCARDIOGRAPHY  11/11/2012   EF 55-60%, wall motion normal, no regional wall motion abnormalities, thickness of ventricular septum was mildly increased, mild regurg of the mitral valve    Current Medications: Current Meds  Medication Sig  . aspirin EC 81 MG tablet Take 81 mg by mouth daily.   . Calcium Carbonate-Vit D-Min (CALCIUM 1200 PO) Take by mouth. Per patient taking 400 mg daily  . diltiazem (CARDIZEM CD) 120 MG 24 hr capsule Take 1 capsule (  120 mg total) by mouth daily.  . hydroxyurea (HYDREA) 500 MG capsule TAKE ONE CAPSULE BY MOUTH EVERY DAY  . metoprolol tartrate (LOPRESSOR) 25 MG tablet Take 1 tablet (25 mg total) by mouth in the morning.  . Omega-3 Fatty Acids (FISH OIL) 1000 MG CAPS Take by mouth in the morning and at bedtime.  . rosuvastatin (CRESTOR) 40 MG tablet TAKE 1 TABLET(40 MG) BY MOUTH DAILY  . SSD 1 % cream Apply  topically 2 (two) times daily.  . tamsulosin (FLOMAX) 0.4 MG CAPS capsule Take 0.4 mg by mouth daily.  Marland Kitchen triamcinolone (KENALOG) 0.1 % Apply topically 2 (two) times daily.  Marland Kitchen triamcinolone cream (KENALOG) 0.5 % Apply 1 application topically as needed (rash).      Allergies:   Zocor [simvastatin]   Social History   Socioeconomic History  . Marital status: Married    Spouse name: Not on file  . Number of children: Not on file  . Years of education: Not on file  . Highest education level: Not on file  Occupational History  . Not on file  Tobacco Use  . Smoking status: Former Smoker    Quit date: 04/17/1979    Years since quitting: 41.7  . Smokeless tobacco: Never Used  Vaping Use  . Vaping Use: Never used  Substance and Sexual Activity  . Alcohol use: Yes    Alcohol/week: 12.0 standard drinks    Types: 12 Cans of beer per week  . Drug use: No  . Sexual activity: Not on file  Other Topics Concern  . Not on file  Social History Narrative   The patient is married. He lives at [facility]. Retired since 2012. Remote smoker.   Social Determinants of Health   Financial Resource Strain: Not on file  Food Insecurity: Not on file  Transportation Needs: Not on file  Physical Activity: Not on file  Stress: Not on file  Social Connections: Not on file     Family History: The patient's family history includes Colon cancer in his maternal uncle; Heart attack in his maternal grandfather, maternal uncle, maternal uncle, and maternal uncle; Heart disease in his sister.  ROS:   Please see the history of present illness.    All other systems reviewed and are negative.  EKGs/Labs/Other Studies Reviewed:    The following studies were reviewed today: Stress Myoview 01/29/2020: Study Highlights   The left ventricular ejection fraction is hyperdynamic (>65%).  Nuclear stress EF: 66%.  There was no ST segment deviation noted during stress.  The study is normal.  This is a low risk  study.  No change from prior study.  EKG:  EKG is ordered today.  The ekg ordered today demonstrates NSR 74 bpm, possible age-indeterminate inferior MI, otherwise normal  Recent Labs: 07/01/2020: ALT 17; BUN 13; Creatinine, Ser 1.09; Potassium 5.0; Sodium 142 12/06/2020: Hemoglobin 16.6; Platelets 189  Recent Lipid Panel    Component Value Date/Time   CHOL 121 12/02/2019 0953   TRIG 110 12/02/2019 0953   HDL 44 12/02/2019 0953   CHOLHDL 2.8 12/02/2019 0953   CHOLHDL 3.1 07/13/2016 0832   VLDL 21 07/13/2016 0832   LDLCALC 57 12/02/2019 0953        Physical Exam:    VS:  BP 120/90   Pulse 74   Ht 5\' 11"  (1.803 m)   Wt 264 lb 12.8 oz (120.1 kg)   SpO2 96%   BMI 36.93 kg/m     Wt Readings from  Last 3 Encounters:  01/05/21 264 lb 12.8 oz (120.1 kg)  12/06/20 265 lb 8 oz (120.4 kg)  07/07/20 260 lb (117.9 kg)     GEN:  Well nourished, well developed in no acute distress HEENT: Normal NECK: No JVD; No carotid bruits LYMPHATICS: No lymphadenopathy CARDIAC: RRR, soft SEM at the RUSB RESPIRATORY:  Clear to auscultation without rales, wheezing or rhonchi  ABDOMEN: Soft, non-tender, non-distended MUSCULOSKELETAL:  No edema; No deformity  SKIN: Warm and dry NEUROLOGIC:  Alert and oriented x 3 PSYCHIATRIC:  Normal affect   ASSESSMENT:    1. Coronary artery disease involving native coronary artery of native heart without angina pectoris   2. Essential hypertension   3. Mixed hyperlipidemia   4. Paroxysmal atrial fibrillation (HCC)    PLAN:    In order of problems listed above:  1. The patient is stable with no symptoms of angina.  His medical program will be continued without change.  His Myoview scan last year is reviewed and showed no ischemia. 2. Blood pressure is controlled on diltiazem and metoprolol 3. Last lipids reviewed from 1 year ago.  We will repeat his labs today.  He is treated with rosuvastatin 40 mg daily.  Lifestyle modification as discussed. 4. Single  episode of atrial fibrillation in the setting of COVID-19 pneumonia.  Patient has had no recurrence.  He has undergone cardiac monitoring and is no longer on anticoagulation.  We discussed lifestyle modification and how it might impact his risk of recurrence.  He is maintaining sinus rhythm at present.        Medication Adjustments/Labs and Tests Ordered: Current medicines are reviewed at length with the patient today.  Concerns regarding medicines are outlined above.  Orders Placed This Encounter  Procedures  . Comprehensive metabolic panel  . Lipid panel  . EKG 12-Lead   No orders of the defined types were placed in this encounter.   Patient Instructions  Medication Instructions:  Your provider recommends that you continue on your current medications as directed. Please refer to the Current Medication list given to you today.   *If you need a refill on your cardiac medications before your next appointment, please call your pharmacy*  Lab Work: TODAY! CMET, lipids If you have labs (blood work) drawn today and your tests are completely normal, you will receive your results only by: Marland Kitchen MyChart Message (if you have MyChart) OR . A paper copy in the mail If you have any lab test that is abnormal or we need to change your treatment, we will call you to review the results.  Follow-Up: At [facility], you and your health needs are our priority.  As part of our continuing mission to provide you with exceptional heart care, we have created designated Provider Care Teams.  These Care Teams include your primary Cardiologist (physician) and Advanced Practice Providers (APPs -  Physician Assistants and Nurse Practitioners) who all work together to provide you with the care you need, when you need it. Your next appointment:   12 month(s) The format for your next appointment:   In Person Provider:   You may see Sherren Mocha, MD or one of the following Advanced Practice Providers on your  designated Care Team:    Richardson Dopp, PA-C  Robbie Lis, [place]      Signed, Sherren Mocha, MD  01/05/2021 1:39 PM    [facility]

## 2021-01-13 ENCOUNTER — Telehealth: Payer: Self-pay

## 2021-01-13 NOTE — Telephone Encounter (Signed)
-----   Message from Sherren Mocha, MD sent at 01/07/2021  6:23 AM EST ----- Overall labs look very good. LDL cholesterol is above goal this year, increased from last year. If he will work hard on lifestyle modification, ok to try that. If not, I would add Zetia 10 mg daily. thanks

## 2021-01-13 NOTE — Telephone Encounter (Signed)
Called patient. Left message that a MyChart message will be sent and he can reply there or call back.

## 2021-01-14 DIAGNOSIS — C61 Malignant neoplasm of prostate: Secondary | ICD-10-CM | POA: Diagnosis not present

## 2021-01-26 DIAGNOSIS — I10 Essential (primary) hypertension: Secondary | ICD-10-CM | POA: Diagnosis not present

## 2021-01-26 DIAGNOSIS — E669 Obesity, unspecified: Secondary | ICD-10-CM | POA: Diagnosis not present

## 2021-01-26 DIAGNOSIS — E7800 Pure hypercholesterolemia, unspecified: Secondary | ICD-10-CM | POA: Diagnosis not present

## 2021-02-07 ENCOUNTER — Other Ambulatory Visit: Payer: Self-pay | Admitting: Hematology and Oncology

## 2021-02-07 DIAGNOSIS — D45 Polycythemia vera: Secondary | ICD-10-CM

## 2021-02-13 ENCOUNTER — Other Ambulatory Visit: Payer: Self-pay | Admitting: Cardiovascular Disease

## 2021-04-01 NOTE — Progress Notes (Signed)
Leonard Washington  [address] [place],  [place]  [address] [phone]  Clinic Day:  04/05/2021  Referring physician: Serita Grammes, MD  This document serves as a record of services personally performed by Dequincy Macarthur Critchley, MD. It was created on their behalf by [facility] E, a trained medical scribe. The creation of this record is based on the scribe's personal observations and the provider's statements to them.  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  He receives intermittent phlebotomies to keep his hematocrit under 45.  He is also on hydroxyurea 500 mg daily to keep his white cells and platelets from becoming more elevated over time.  He comes in today to reassess his peripheral counts.  Since his last visit, the patient has been doing fairly well.  With respect to his polycythemia rubra vera, he denies having any hyperviscosity-related symptoms from this underlying condition.  PHYSICAL EXAM:  Blood pressure 124/70, pulse 60, temperature 98.9 F (37.2 C), resp. rate 18, height 5\' 11"  (1.803 m), weight 262 lb 12.8 oz (119.2 kg), SpO2 97 %. Wt Readings from Last 3 Encounters:  04/05/21 262 lb 12.8 oz (119.2 kg)  01/05/21 264 lb 12.8 oz (120.1 kg)  12/06/20 265 lb 8 oz (120.4 kg)   Body mass index is 36.65 kg/m. Performance status (ECOG): 0 Physical Exam Constitutional:      Appearance: Normal appearance. He is not ill-appearing.  HENT:     Mouth/Throat:     Mouth: Mucous membranes are moist.     Pharynx: Oropharynx is clear. No oropharyngeal exudate or posterior oropharyngeal erythema.  Cardiovascular:     Rate and Rhythm: Normal rate and regular rhythm.     Heart sounds: No murmur heard. No friction rub. No gallop.   Pulmonary:     Effort: Pulmonary effort is normal. No respiratory distress.     Breath sounds: Normal breath sounds. No wheezing, rhonchi or rales.  Chest:  Breasts:      Right: No axillary adenopathy or supraclavicular adenopathy.     Left: No axillary adenopathy or supraclavicular adenopathy.    Abdominal:     General: Bowel sounds are normal. There is no distension.     Palpations: Abdomen is soft. There is no mass.     Tenderness: There is no abdominal tenderness.  Musculoskeletal:        General: No swelling.     Right lower leg: No edema.     Left lower leg: No edema.  Lymphadenopathy:     Cervical: No cervical adenopathy.     Upper Body:     Right upper body: No supraclavicular or axillary adenopathy.     Left upper body: No supraclavicular or axillary adenopathy.     Lower Body: No right inguinal adenopathy. No left inguinal adenopathy.  Skin:    General: Skin is warm.     Coloration: Skin is not jaundiced.     Findings: No lesion or rash.  Neurological:     General: No focal deficit present.     Mental Status: He is alert and oriented to person, place, and time. Mental status is at baseline.     Cranial Nerves: Cranial nerves are intact.  Psychiatric:        Mood and Affect: Mood normal.        Behavior: Behavior normal.        Thought Content: Thought content normal.  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  As his hematocrit is virtually at 45, I do think he can go by these next few months without needing a phlebotomy.  I remain pleased with the levels of his white cells and platelets.  He knows to continue taking hydroxyurea 500 mg daily.  He also knows to continue taking a baby aspirin on a daily basis to offset any potential clotting complications his polycythemia rubra vera can cause.  I will see him back in 4 months for repeat clinical assessment.  The patient understands all the plans discussed today and is in agreement with them.     I, Rita Ohara, am acting as scribe for Marice Potter, MD    I have reviewed this report as typed by the medical scribe, and it is  complete and accurate.  Dequincy Macarthur Critchley, MD

## 2021-04-05 ENCOUNTER — Telehealth: Payer: Self-pay | Admitting: Oncology

## 2021-04-05 ENCOUNTER — Encounter: Payer: Self-pay | Admitting: Oncology

## 2021-04-05 ENCOUNTER — Other Ambulatory Visit: Payer: Self-pay | Admitting: Oncology

## 2021-04-05 ENCOUNTER — Inpatient Hospital Stay: Payer: Medicare Other

## 2021-04-05 ENCOUNTER — Other Ambulatory Visit: Payer: Self-pay

## 2021-04-05 ENCOUNTER — Inpatient Hospital Stay: Payer: Medicare Other | Attending: Oncology | Admitting: Oncology

## 2021-04-05 VITALS — BP 124/70 | HR 60 | Temp 98.9°F | Resp 18 | Ht 71.0 in | Wt 262.8 lb

## 2021-04-05 DIAGNOSIS — D45 Polycythemia vera: Secondary | ICD-10-CM | POA: Diagnosis not present

## 2021-04-05 DIAGNOSIS — D649 Anemia, unspecified: Secondary | ICD-10-CM | POA: Diagnosis not present

## 2021-04-05 LAB — CBC: RBC: 4.48 (ref 3.87–5.11)

## 2021-04-05 LAB — CBC AND DIFFERENTIAL
HCT: 45 (ref 41–53)
Hemoglobin: 15.2 (ref 13.5–17.5)
Neutrophils Absolute: 6.64
Platelets: 236 (ref 150–399)
WBC: 8.3

## 2021-04-05 NOTE — Telephone Encounter (Signed)
Per 6/7 los next appt scheduled and given to patient 

## 2021-04-06 IMAGING — CT CT ANGIO CHEST
2 of 6 series · 17 of 46 positions shown · IV contrast (omnipaque)
Comparison: December 29, 2019.

CLINICAL DATA: Shortness of breath, elevated D-dimer level.

EXAM:
CT ANGIOGRAPHY CHEST WITH CONTRAST
TECHNIQUE: Multidetector CT imaging of the chest was performed using the
standard protocol during bolus administration of intravenous
contrast. Multiplanar CT image reconstructions and MIPs were
obtained to evaluate the vascular anatomy.
CONTRAST:  60 mL OMNIPAQUE IOHEXOL 350 MG/ML SOLN

[Series 6: thins · axial · 0.81mm/px · z∈[+1036,+1274]mm · 14 of 263 slices shown]
[im 12/263  lung]
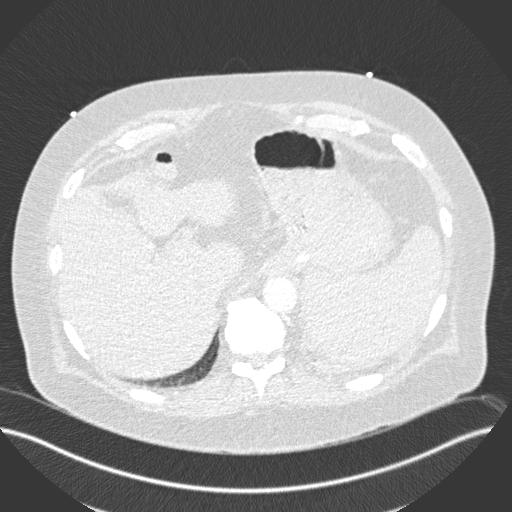
[im 35/263  soft-tissue]
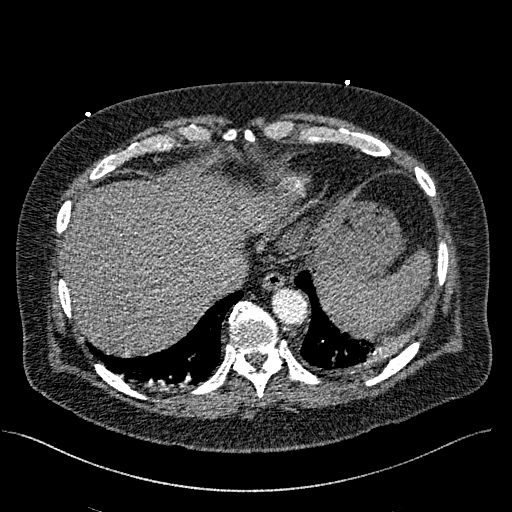
[im 46/263  lung]
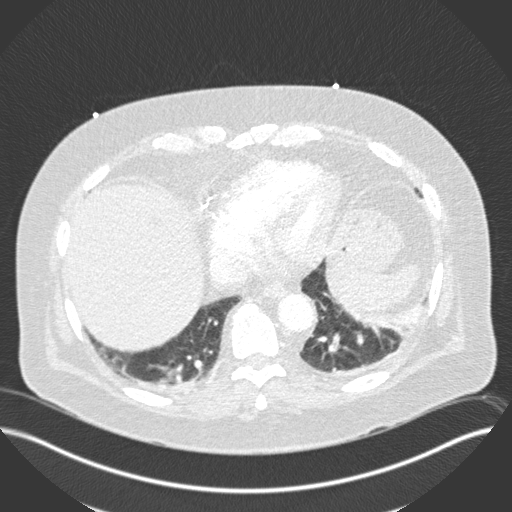
[im 69/263  soft-tissue]
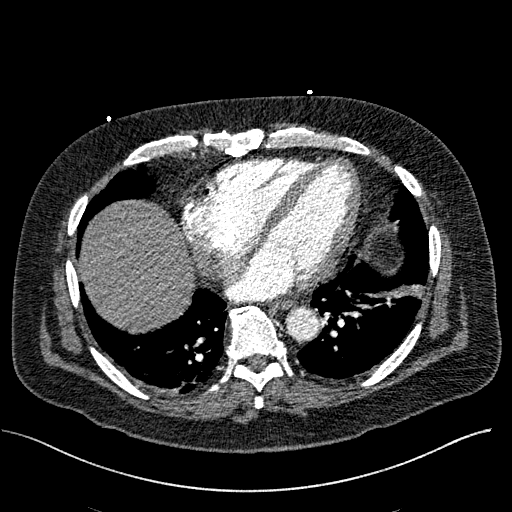
[im 92/263  lung]
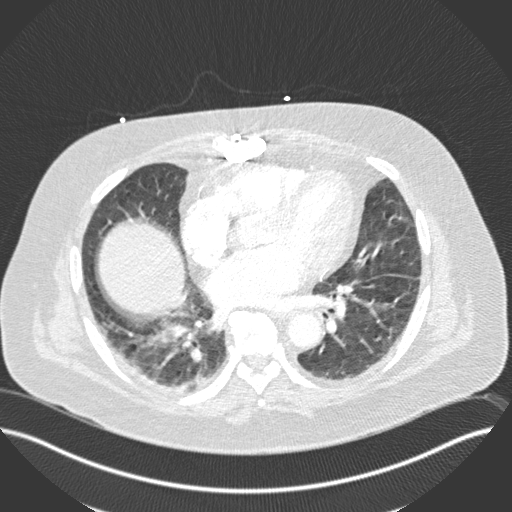
[im 103/263  soft-tissue]
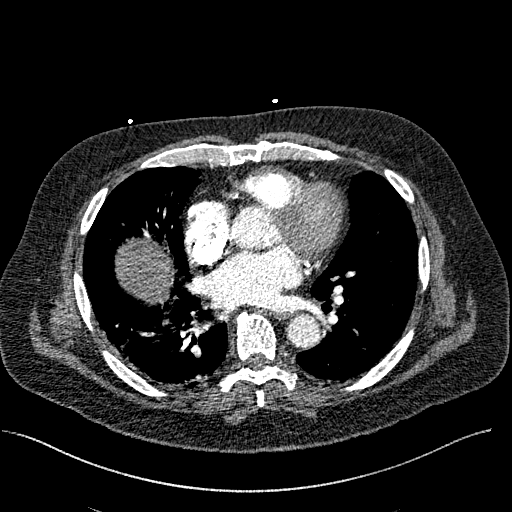
[im 126/263  lung]
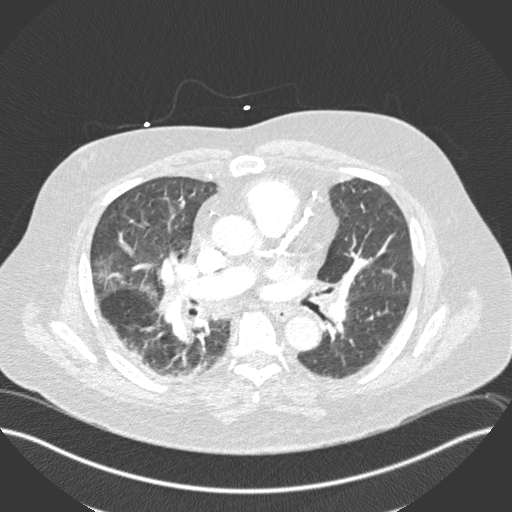
[im 137/263  soft-tissue]
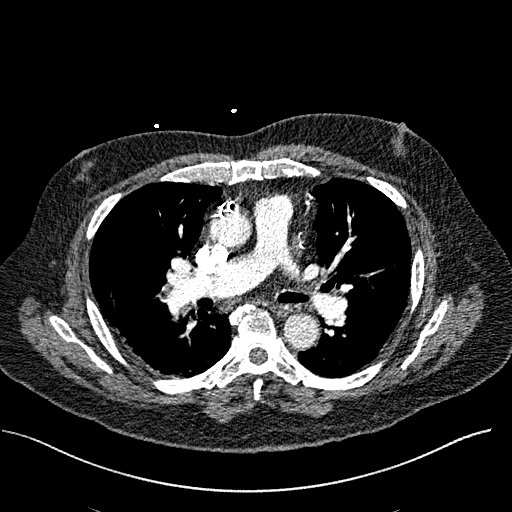
[im 160/263  lung]
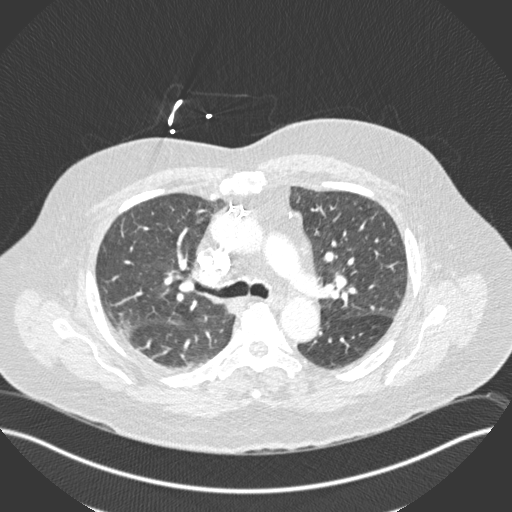
[im 171/263  soft-tissue]
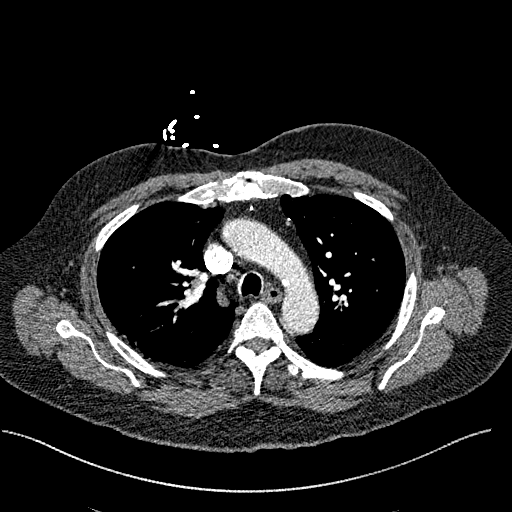
[im 194/263  lung]
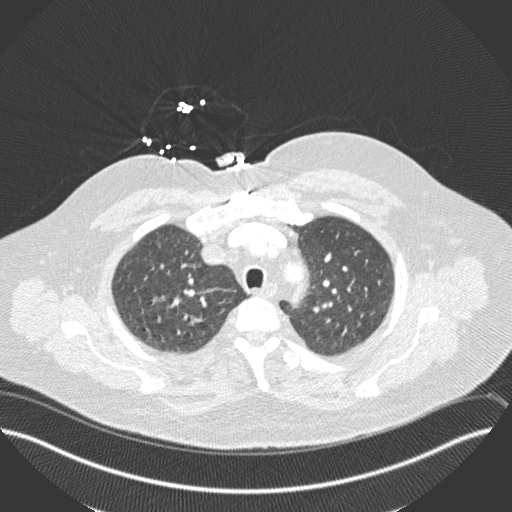
[im 217/263  soft-tissue]
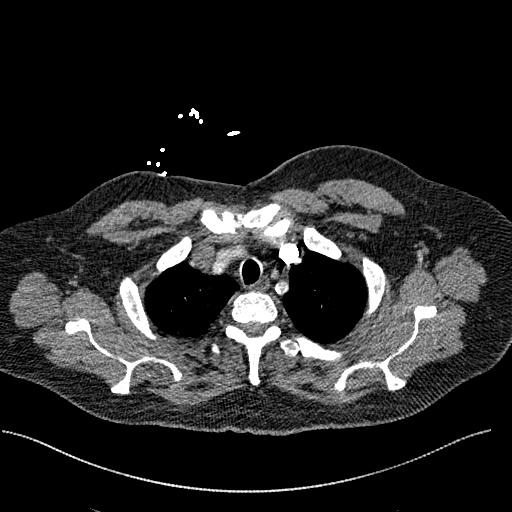
[im 228/263  lung]
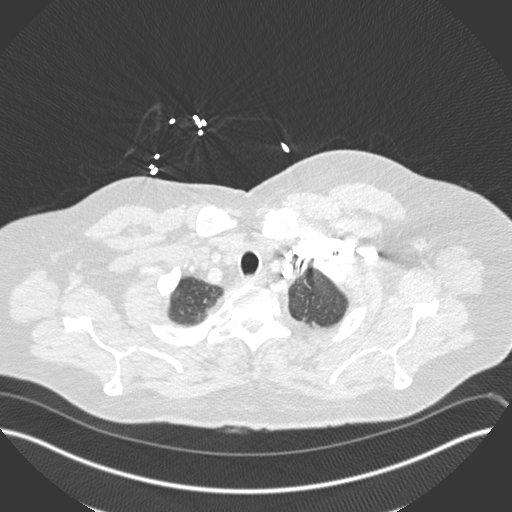
[im 251/263  soft-tissue]
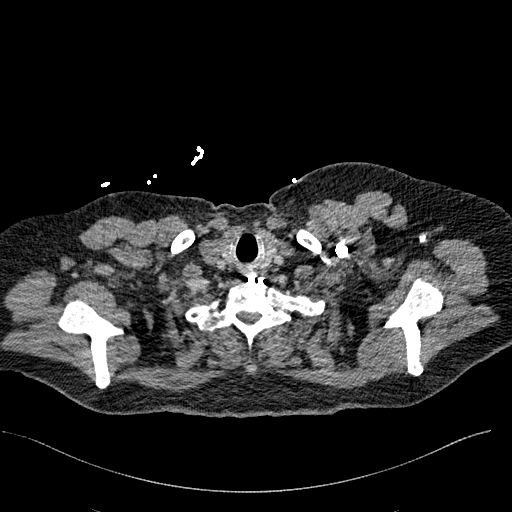

[Series 8: coronal mpr · coronal · 0.52mm/px · 3 of 151 slices shown]
[im 38/151  soft-tissue]
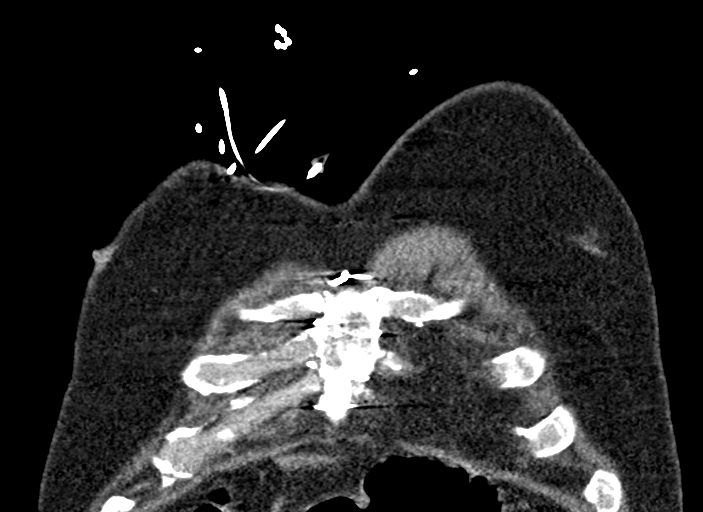
[im 76/151  soft-tissue]
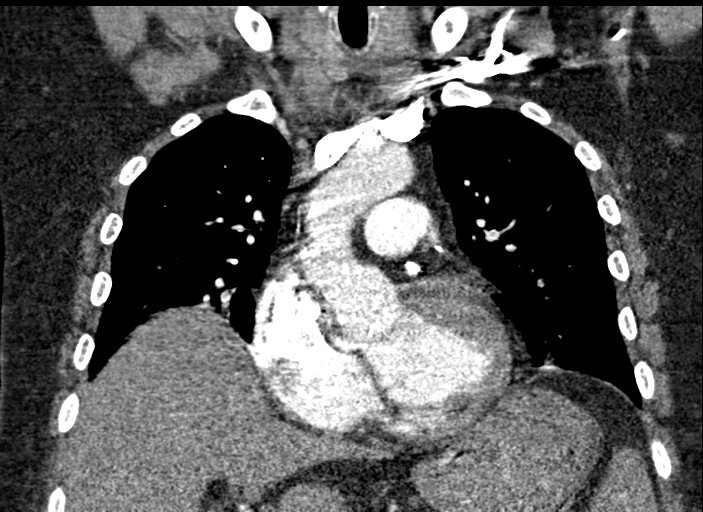
[im 113/151  soft-tissue]
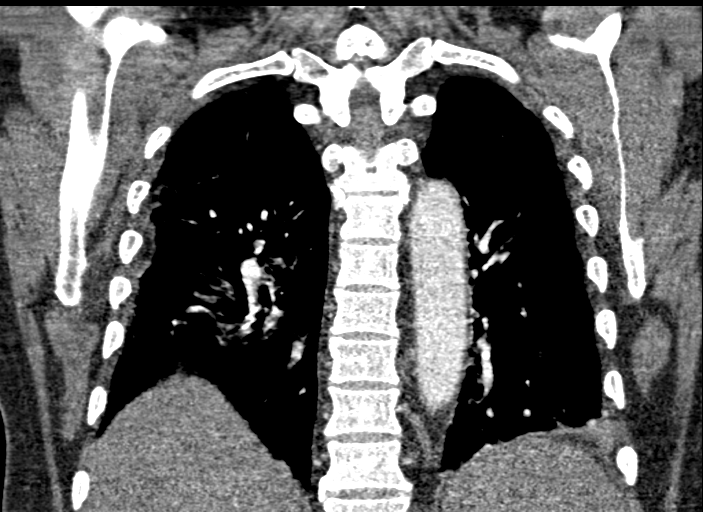

[17 of 46 positions shown; findings below may reference images not displayed]

FINDINGS: Cardiovascular: Satisfactory opacification of the pulmonary arteries
to the segmental level. No evidence of pulmonary embolism. Normal
heart size. No pericardial effusion. Atherosclerosis of thoracic
aorta is noted without aneurysm or dissection. Status post coronary
bypass graft.

Mediastinum/Nodes: No enlarged mediastinal, hilar, or axillary lymph
nodes. Thyroid gland, trachea, and esophagus demonstrate no
significant findings.

Lungs/Pleura: No pneumothorax or pleural effusion is noted. Mild
bibasilar subsegmental atelectasis is noted. 8 mm ground-glass
opacity is noted laterally in the left upper lobe best seen on image
number 28 of series 7 which was present on prior exam.

Upper Abdomen: No acute abnormality.

Musculoskeletal: No chest wall abnormality. No acute or significant
osseous findings.

Review of the MIP images confirms the above findings.
IMPRESSION: 1. No definite evidence of pulmonary embolus.
2. Mild bibasilar subsegmental atelectasis is noted.
3. Status post coronary bypass graft.
4. 8 mm ground-glass opacity is noted laterally in the left upper
lobe. Initial follow-up by chest CT without contrast is recommended
in 3 months to confirm persistence. This recommendation follows the
consensus statement: Recommendations for the Management of Subsolid
Pulmonary Nodules Detected at CT: A Statement from the [place]

Aortic Atherosclerosis ([id number]).

## 2021-04-07 DIAGNOSIS — R0600 Dyspnea, unspecified: Secondary | ICD-10-CM | POA: Diagnosis not present

## 2021-04-07 DIAGNOSIS — C61 Malignant neoplasm of prostate: Secondary | ICD-10-CM | POA: Diagnosis not present

## 2021-04-07 DIAGNOSIS — E7800 Pure hypercholesterolemia, unspecified: Secondary | ICD-10-CM | POA: Diagnosis not present

## 2021-04-07 DIAGNOSIS — I480 Paroxysmal atrial fibrillation: Secondary | ICD-10-CM | POA: Diagnosis not present

## 2021-04-07 DIAGNOSIS — D45 Polycythemia vera: Secondary | ICD-10-CM | POA: Diagnosis not present

## 2021-04-10 IMAGING — DX DG CHEST 1V PORT
1 series · 1 of 1 positions shown · non-contrast
Comparison: January 01, 2020.

CLINICAL DATA: Shortness of breath.

EXAM:
PORTABLE CHEST 1 VIEW

[chest ap]
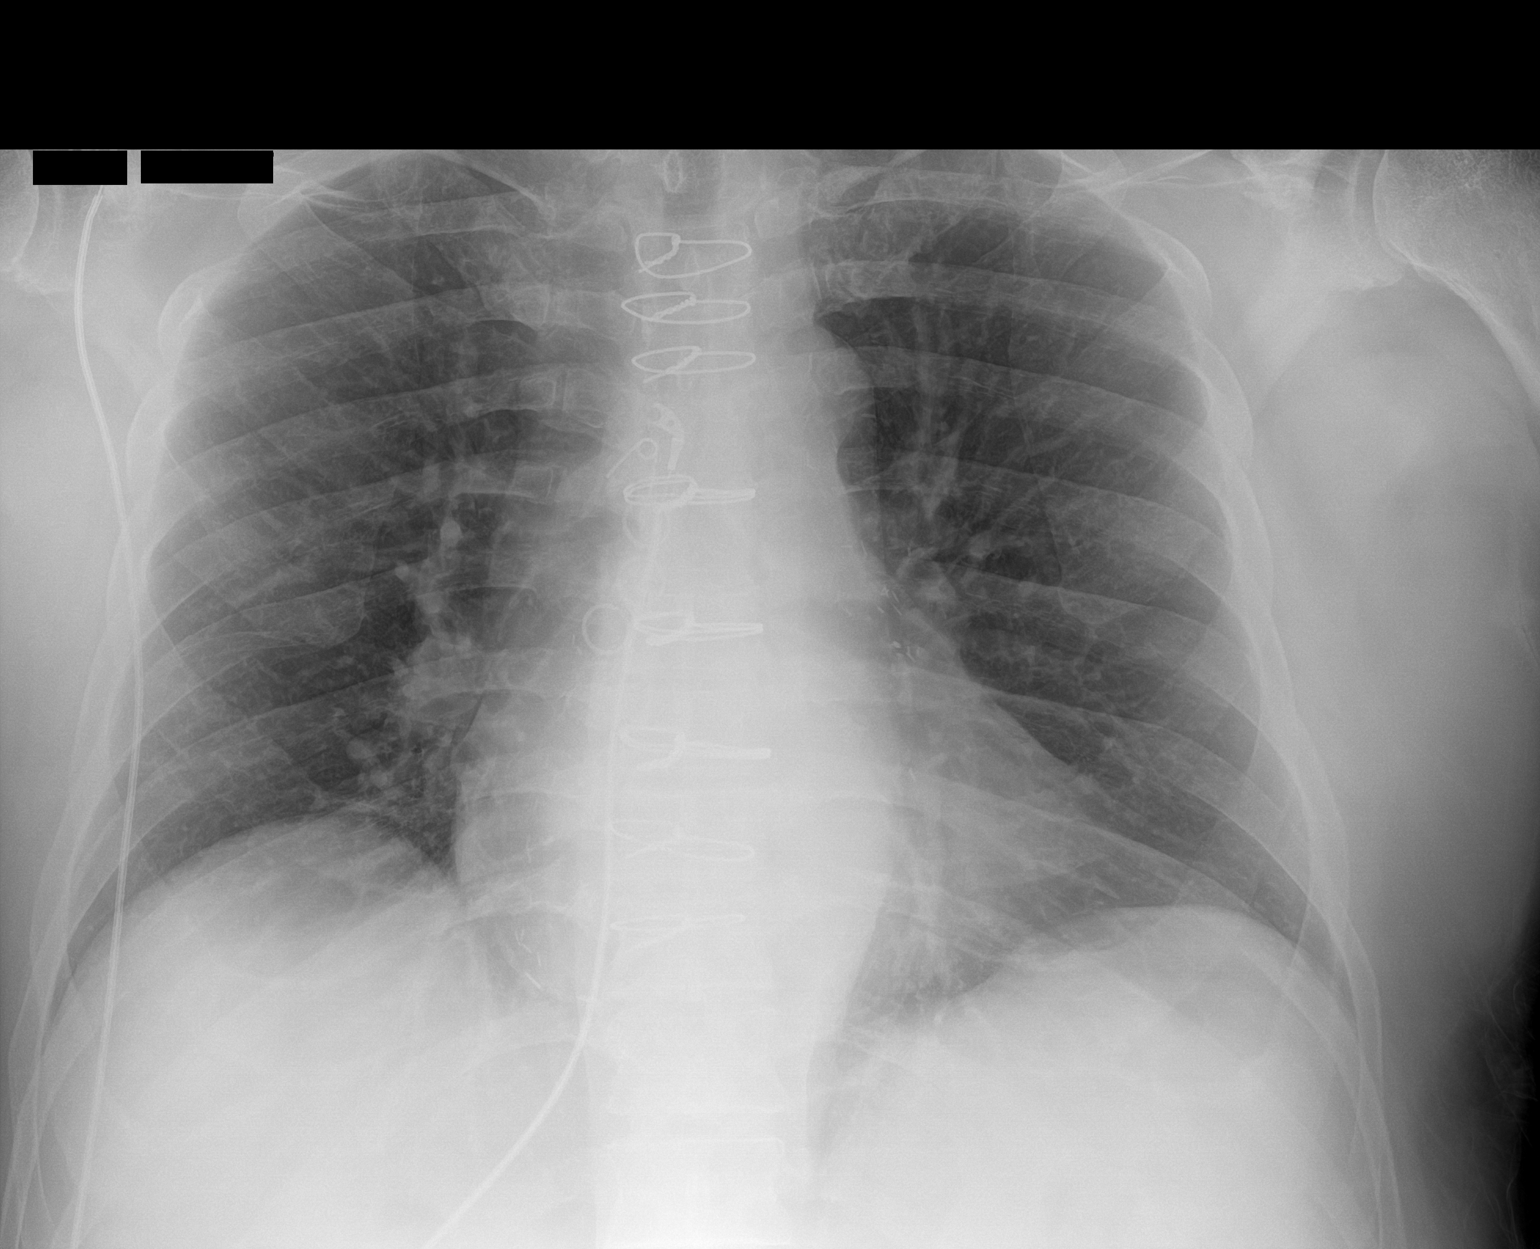

[1 of 1 positions shown; findings below may reference images not displayed]

FINDINGS: Stable cardiomediastinal silhouette. No pneumothorax or pleural
effusion is noted. Status post coronary bypass graft. Both lungs are
clear. The visualized skeletal structures are unremarkable.
IMPRESSION: No active disease.

## 2021-04-15 ENCOUNTER — Telehealth: Payer: Self-pay | Admitting: Cardiovascular Disease

## 2021-04-15 NOTE — Telephone Encounter (Signed)
Pt c/o medication issue:  1. Name of Medication:  rosuvastatin (CRESTOR) 40 MG tablet  2. How are you currently taking this medication (dosage and times per day)?   3. Are you having a reaction (difficulty breathing--STAT)?   4. What is your medication issue?   Marita [place] with [employer] is requesting to verify patient's medication instructions.  She states she needs to know the strength and dosage.  Phone #: [phone] (case ID#: [id number])

## 2021-04-15 NOTE — Telephone Encounter (Signed)
Left message to call back  

## 2021-04-19 NOTE — Telephone Encounter (Signed)
Returned call to [employer], Education administrator with [employer]. I reviewed instructions for Crestor 40 mg once daily. She advised patient has been alternating 40 mg with 20 mg and wanted to verify prescriber's instructions. She thanked me for the call.

## 2021-05-24 DIAGNOSIS — I2510 Atherosclerotic heart disease of native coronary artery without angina pectoris: Secondary | ICD-10-CM

## 2021-05-24 DIAGNOSIS — E782 Mixed hyperlipidemia: Secondary | ICD-10-CM

## 2021-05-30 DIAGNOSIS — S86011A Strain of right Achilles tendon, initial encounter: Secondary | ICD-10-CM | POA: Diagnosis not present

## 2021-05-30 DIAGNOSIS — M7661 Achilles tendinitis, right leg: Secondary | ICD-10-CM | POA: Diagnosis not present

## 2021-05-30 DIAGNOSIS — I2510 Atherosclerotic heart disease of native coronary artery without angina pectoris: Secondary | ICD-10-CM | POA: Diagnosis not present

## 2021-05-30 DIAGNOSIS — E782 Mixed hyperlipidemia: Secondary | ICD-10-CM | POA: Diagnosis not present

## 2021-05-31 LAB — LIPID PANEL
Chol/HDL Ratio: 2.7 ratio (ref 0.0–5.0)
Cholesterol, Total: 147 mg/dL (ref 100–199)
HDL: 54 mg/dL (ref >39)
LDL Chol Calc (NIH): 74 mg/dL (ref 0–99)
Triglycerides: 101 mg/dL (ref 0–149)
VLDL Cholesterol Cal: 19 mg/dL (ref 5–40)

## 2021-05-31 LAB — HEPATIC FUNCTION PANEL
ALT: 16 IU/L (ref 0–44)
AST: 19 IU/L (ref 0–40)
Albumin: 4 g/dL (ref 3.7–4.7)
Alkaline Phosphatase: 93 IU/L (ref 44–121)
Bilirubin Total: 0.7 mg/dL (ref 0.0–1.2)
Bilirubin, Direct: 0.22 mg/dL (ref 0.00–0.40)
Total Protein: 6.1 g/dL (ref 6.0–8.5)

## 2021-06-13 DIAGNOSIS — H539 Unspecified visual disturbance: Secondary | ICD-10-CM | POA: Diagnosis not present

## 2021-07-28 NOTE — Progress Notes (Signed)
[facility]  [address] [place],  [place]  [address] [phone]  Clinic Day:  08/05/2021  Referring physician: Serita Grammes, MD  This document serves as a record of services personally performed by Ryland Tungate Macarthur Critchley, MD. It was created on their behalf by [facility] E, a trained medical scribe. The creation of this record is based on the scribe's personal observations and the provider's statements to them.  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  He receives intermittent phlebotomies to keep his hematocrit under 45.  He is also on hydroxyurea 500 mg daily to keep his white cells and platelets from becoming more elevated over time.  He comes in today to reassess his peripheral counts.  Since his last visit, the patient has been doing fairly well.  With respect to his polycythemia rubra vera, he denies having any hyperviscosity-related symptoms from this underlying condition.  PHYSICAL EXAM:  Blood pressure 124/67, pulse (!) 55, temperature 98.1 F (36.7 C), resp. rate 16, height 5\' 11"  (1.803 m), weight 263 lb 12.8 oz (119.7 kg), SpO2 97 %. Wt Readings from Last 3 Encounters:  08/05/21 263 lb 12.8 oz (119.7 kg)  04/05/21 262 lb 12.8 oz (119.2 kg)  01/05/21 264 lb 12.8 oz (120.1 kg)   Body mass index is 36.79 kg/m. Performance status (ECOG): 0 Physical Exam Constitutional:      Appearance: Normal appearance. He is not ill-appearing.  HENT:     Mouth/Throat:     Mouth: Mucous membranes are moist.     Pharynx: Oropharynx is clear. No oropharyngeal exudate or posterior oropharyngeal erythema.  Cardiovascular:     Rate and Rhythm: Normal rate and regular rhythm.     Heart sounds: No murmur heard.   No friction rub. No gallop.  Pulmonary:     Effort: Pulmonary effort is normal. No respiratory distress.     Breath sounds: Normal breath sounds. No wheezing, rhonchi or rales.  Abdominal:      General: Bowel sounds are normal. There is no distension.     Palpations: Abdomen is soft. There is no mass.     Tenderness: There is no abdominal tenderness.  Musculoskeletal:        General: No swelling.     Right lower leg: No edema.     Left lower leg: No edema.  Lymphadenopathy:     Cervical: No cervical adenopathy.     Upper Body:     Right upper body: No supraclavicular or axillary adenopathy.     Left upper body: No supraclavicular or axillary adenopathy.     Lower Body: No right inguinal adenopathy. No left inguinal adenopathy.  Skin:    General: Skin is warm.     Coloration: Skin is not jaundiced.     Findings: No lesion or rash.  Neurological:     General: No focal deficit present.     Mental Status: He is alert and oriented to person, place, and time. Mental status is at baseline.     Cranial Nerves: Cranial nerves are intact.  Psychiatric:        Mood and Affect: Mood normal.        Behavior: Behavior normal.        Thought Content: Thought content normal.    LABS:     Ref. 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  As his hematocrit is above 45, he needs to be phlebotomized.  He will go to his phlebotomy center in [place] to have this done.  I remain pleased with xthe levels of his white cells and platelets.  He knows to continue taking hydroxyurea 500 mg daily.  He also knows to continue taking a baby aspirin on a daily basis to offset any potential clotting complications his polycythemia rubra vera can cause.  I will see him back in 4 months for repeat clinical assessment.  The patient understands all the plans discussed today and is in agreement with them.     I, Rita Ohara, am acting  as scribe for Marice Potter, MD    I have reviewed this report as typed by the medical scribe, and it is complete and accurate.  Edric Fetterman Macarthur Critchley, MD

## 2021-08-05 ENCOUNTER — Other Ambulatory Visit: Payer: Self-pay | Admitting: Hematology and Oncology

## 2021-08-05 ENCOUNTER — Telehealth: Payer: Self-pay | Admitting: Oncology

## 2021-08-05 ENCOUNTER — Inpatient Hospital Stay: Payer: Medicare Other | Attending: Oncology | Admitting: Oncology

## 2021-08-05 ENCOUNTER — Inpatient Hospital Stay: Payer: Medicare Other

## 2021-08-05 VITALS — BP 124/67 | HR 55 | Temp 98.1°F | Resp 16 | Ht 71.0 in | Wt 263.8 lb

## 2021-08-05 DIAGNOSIS — D45 Polycythemia vera: Secondary | ICD-10-CM

## 2021-08-05 DIAGNOSIS — D649 Anemia, unspecified: Secondary | ICD-10-CM | POA: Diagnosis not present

## 2021-08-05 LAB — CBC AND DIFFERENTIAL
HCT: 46 (ref 41–53)
Hemoglobin: 15.5 (ref 13.5–17.5)
Neutrophils Absolute: 5.48
Platelets: 221 (ref 150–399)
WBC: 7.3

## 2021-08-05 LAB — CBC: RBC: 4.68 (ref 3.87–5.11)

## 2021-08-05 NOTE — Telephone Encounter (Signed)
Per 10/7 LOS NEXT APPT SCHEDULED AND GIVEN TO PATIENT

## 2021-08-09 ENCOUNTER — Encounter: Payer: Self-pay | Admitting: Oncology

## 2021-08-11 DIAGNOSIS — Z8551 Personal history of malignant neoplasm of bladder: Secondary | ICD-10-CM | POA: Diagnosis not present

## 2021-08-11 DIAGNOSIS — Z8546 Personal history of malignant neoplasm of prostate: Secondary | ICD-10-CM | POA: Diagnosis not present

## 2021-08-17 DIAGNOSIS — Z23 Encounter for immunization: Secondary | ICD-10-CM | POA: Diagnosis not present

## 2021-08-27 ENCOUNTER — Other Ambulatory Visit: Payer: Self-pay | Admitting: Cardiovascular Disease

## 2021-10-10 DIAGNOSIS — E7800 Pure hypercholesterolemia, unspecified: Secondary | ICD-10-CM | POA: Diagnosis not present

## 2021-10-10 DIAGNOSIS — D45 Polycythemia vera: Secondary | ICD-10-CM | POA: Diagnosis not present

## 2021-10-10 DIAGNOSIS — C61 Malignant neoplasm of prostate: Secondary | ICD-10-CM | POA: Diagnosis not present

## 2021-10-10 DIAGNOSIS — I10 Essential (primary) hypertension: Secondary | ICD-10-CM | POA: Diagnosis not present

## 2021-11-25 DIAGNOSIS — H353131 Nonexudative age-related macular degeneration, bilateral, early dry stage: Secondary | ICD-10-CM | POA: Diagnosis not present

## 2021-11-25 DIAGNOSIS — H43393 Other vitreous opacities, bilateral: Secondary | ICD-10-CM | POA: Diagnosis not present

## 2021-11-25 DIAGNOSIS — H26492 Other secondary cataract, left eye: Secondary | ICD-10-CM | POA: Diagnosis not present

## 2021-11-25 DIAGNOSIS — H40013 Open angle with borderline findings, low risk, bilateral: Secondary | ICD-10-CM | POA: Diagnosis not present

## 2021-11-30 NOTE — Progress Notes (Signed)
[facility]  [address] [place],  [place]  [address] [phone]  Clinic Day:  12/06/2021  Referring physician: Serita Grammes, MD  This document serves as a record of services personally performed by Stiles Maxcy Macarthur Critchley, MD. It was created on their behalf by [facility], a trained medical scribe. The creation of this record is based on the scribe's personal observations and the provider's statements to them.  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  He receives intermittent phlebotomies to keep his hematocrit under 45.  He is also on hydroxyurea 500 mg daily to keep his white cells and platelets from becoming more elevated over time.  He comes in today to reassess his peripheral counts.  Since his last visit, the patient has been doing fairly well.  With respect to his polycythemia rubra vera, he denies having any hyperviscosity-related symptoms from this underlying condition.  PHYSICAL EXAM:  Blood pressure (!) 143/68, pulse (!) 56, temperature 98.7 F (37.1 C), resp. rate 16, height 5\' 11"  (1.803 m), weight 269 lb 11.2 oz (122.3 kg), SpO2 97 %. Wt Readings from Last 3 Encounters:  12/06/21 269 lb 11.2 oz (122.3 kg)  08/05/21 263 lb 12.8 oz (119.7 kg)  04/05/21 262 lb 12.8 oz (119.2 kg)   Body mass index is 37.62 kg/m. Performance status (ECOG): 0 Physical Exam Constitutional:      Appearance: Normal appearance. He is not ill-appearing.  HENT:     Mouth/Throat:     Mouth: Mucous membranes are moist.     Pharynx: Oropharynx is clear. No oropharyngeal exudate or posterior oropharyngeal erythema.  Cardiovascular:     Rate and Rhythm: Regular rhythm. Bradycardia present.     Heart sounds: No murmur heard.   No friction rub. No gallop.  Pulmonary:     Effort: Pulmonary effort is normal. No respiratory distress.     Breath sounds: Normal breath sounds. No wheezing, rhonchi or rales.   Abdominal:     General: Bowel sounds are normal. There is no distension.     Palpations: Abdomen is soft. There is no mass.     Tenderness: There is no abdominal tenderness.  Musculoskeletal:        General: No swelling.     Right lower leg: No edema.     Left lower leg: No edema.  Lymphadenopathy:     Cervical: No cervical adenopathy.     Upper Body:     Right upper body: No supraclavicular or axillary adenopathy.     Left upper body: No supraclavicular or axillary adenopathy.     Lower Body: No right inguinal adenopathy. No left inguinal adenopathy.  Skin:    General: Skin is warm.     Coloration: Skin is not jaundiced.     Findings: No lesion or rash.  Neurological:     General: No focal deficit present.     Mental Status: He is alert and oriented to person, place, and time. Mental status is at baseline.  Psychiatric:        Mood and Affect: Mood normal.        Behavior: Behavior normal.        Thought Content: Thought content normal.    LABS:     Latest Reference Range & Units 12/06/21 00:00  WBC  8.0 (E)  RBC 3.87 - 5.11  4.6 (E)  Hemoglobin 13.5 - 17.5  15.6 (E)  HCT 41 -  53  46 (E)  MCV 80 - 94  100 ! 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  As his hematocrit is just above 45, he needs to be phlebotomized.  He will go to his phlebotomy center in [place] to have this done.  I believe he only needs to be phlebotomized once over these next months.  I remain pleased with the levels of his white cells and platelets.  He knows to continue taking hydroxyurea 500 mg daily.  He also knows to continue taking a baby aspirin on a daily basis to offset any potential clotting complications his polycythemia rubra vera can cause.  I will see him back in 6 months for repeat clinical assessment.  The patient understands all the plans discussed today and is in agreement with them.      I, Rita Ohara, am acting as scribe for Marice Potter, MD    I have reviewed this report as typed by the medical scribe, and it is complete and accurate.  Leith Szafranski Macarthur Critchley, MD

## 2021-12-06 ENCOUNTER — Other Ambulatory Visit: Payer: Self-pay | Admitting: Oncology

## 2021-12-06 ENCOUNTER — Inpatient Hospital Stay: Payer: Medicare Other | Attending: Oncology | Admitting: Oncology

## 2021-12-06 ENCOUNTER — Inpatient Hospital Stay: Payer: Medicare Other

## 2021-12-06 ENCOUNTER — Other Ambulatory Visit: Payer: Self-pay | Admitting: Hematology and Oncology

## 2021-12-06 ENCOUNTER — Other Ambulatory Visit: Payer: Self-pay

## 2021-12-06 VITALS — BP 143/68 | HR 56 | Temp 98.7°F | Resp 16 | Ht 71.0 in | Wt 269.7 lb

## 2021-12-06 DIAGNOSIS — H40013 Open angle with borderline findings, low risk, bilateral: Secondary | ICD-10-CM | POA: Diagnosis not present

## 2021-12-06 DIAGNOSIS — D45 Polycythemia vera: Secondary | ICD-10-CM | POA: Diagnosis not present

## 2021-12-06 LAB — CBC AND DIFFERENTIAL
HCT: 46 (ref 41–53)
Hemoglobin: 15.6 (ref 13.5–17.5)
Neutrophils Absolute: 6.16
Platelets: 231 (ref 150–399)
WBC: 8

## 2021-12-06 LAB — CBC
MCV: 100 — AB (ref 80–94)
RBC: 4.6 (ref 3.87–5.11)

## 2021-12-20 ENCOUNTER — Other Ambulatory Visit: Payer: Self-pay | Admitting: Hematology and Oncology

## 2021-12-20 DIAGNOSIS — D45 Polycythemia vera: Secondary | ICD-10-CM

## 2022-01-04 ENCOUNTER — Encounter: Payer: Self-pay | Admitting: Gastroenterology

## 2022-01-26 ENCOUNTER — Encounter: Payer: Self-pay | Admitting: Cardiovascular Disease

## 2022-01-26 ENCOUNTER — Ambulatory Visit: Payer: Medicare Other | Admitting: Cardiovascular Disease

## 2022-01-26 VITALS — BP 120/76 | HR 70 | Ht 71.0 in | Wt 270.6 lb

## 2022-01-26 DIAGNOSIS — E782 Mixed hyperlipidemia: Secondary | ICD-10-CM

## 2022-01-26 DIAGNOSIS — I480 Paroxysmal atrial fibrillation: Secondary | ICD-10-CM | POA: Diagnosis not present

## 2022-01-26 DIAGNOSIS — I2510 Atherosclerotic heart disease of native coronary artery without angina pectoris: Secondary | ICD-10-CM | POA: Diagnosis not present

## 2022-01-26 DIAGNOSIS — I10 Essential (primary) hypertension: Secondary | ICD-10-CM

## 2022-01-26 MED ORDER — EZETIMIBE 10 MG PO TABS: 10.0000 mg | ORAL_TABLET | Freq: Every day | ORAL | 3 refills | Status: DC

## 2022-01-26 NOTE — Progress Notes (Signed)
?Cardiology Office Note:   ? ?Date:  01/26/2022  ? ?ID:  Leonard Washington, DOB February 05, 1946, MRN [id number] ? ?PCP:  Serita Grammes, MD ?  ?[facility] Providers ?Cardiologist:  Sherren Mocha, MD    ? ?Referring MD: Serita Grammes, MD  ? ?Chief Complaint  ?Patient presents with  ? Coronary Artery Disease  ? ? ?History of Present Illness:   ? ?Leonard Washington is a 76 y.o. male with a hx of coronary artery disease status post remote CABG, presenting for follow-up evaluation.  The patient underwent four-vessel CABG in 2005 with a LIMA to LAD, left radial to RCA, saphenous vein graft to ramus intermedius, and saphenous vein graft to left circumflex marginal.  Comorbid conditions include hypertension and mixed hyperlipidemia.  Noncardiac issues include prostate cancer and polycythemia vera.  The patient was hospitalized in March 2021 with COVID-19 pneumonia.  His illness was complicated by demand ischemia and atrial fibrillation.  He was anticoagulated with apixaban and converted spontaneously back to sinus rhythm.  Outpatient event monitoring once he recovered demonstrated no recurrence of atrial fibrillation.  He has been transitioned back to antiplatelet therapy with aspirin 81 mg daily.  He underwent a stress Myoview scan demonstrating no ischemia.  He presents today for follow-up evaluation. ? ?The patient is here alone today.  He is doing well from a cardiac perspective.  He reports no changes in his medications.  He denies chest pain, chest pressure, shortness of breath.  He denies leg swelling, orthopnea, or PND. ? ?Past Medical History:  ?Diagnosis Date  ? BPH (benign prostatic hyperplasia)   ? Cataract 2016  ? Had Bil surgery  ? Chest congestion 11/29/2018  ? Coronary atherosclerosis of native coronary artery   ? Vessel CABG 2005  ? Hyperlipidemia   ? Hypertension   ? Myocardial infarction [facility]) 1997  ? Had stent put in 197/CABG in2005  ? Shoulder pain   ? right  side  ? SOB (shortness of  breath) on exertion   ? ? ?Past Surgical History:  ?Procedure Laterality Date  ? ARTERIAL DUPLEX  04/19/2004  ? Carotid-no evidence of significant plaque noted, no evidence of significant ICA stenosis, artery flow is antegrade. Upper extremities-within normal limits. Lower extremities-no evidence of lower extremity arterial occlusive disease at rest  ? BLADDER SURGERY    ? had a growth in bladder/benign  ? CARDIAC CATHETERIZATION  04/19/2004  ? severe four vessel coronary disease, continue vigorous medical therapy, recommendations for multivessel bypass grafting  ? CARDIOVASCULAR STRESS TEST  07/01/2009  ? normal pattern of perfusion in all regions, no scintigraphic evidence of inducible myocardial ischemia,EKG negative for ischemia, no ECG changes,  ? CATARACT EXTRACTION, BILATERAL    ? CORONARY ARTERY BYPASS GRAFT  04/20/2004  ? x4. LIMA to LAD, left radial artery to RCA, SVG to ramus intermedius, SVG to circ.  ? DOPPLER ECHOCARDIOGRAPHY  11/11/2012  ? EF 55-60%, wall motion normal, no regional wall motion abnormalities, thickness of ventricular septum was mildly increased, mild regurg of the mitral valve  ? ? ?Current Medications: ?Current Meds  ?Medication Sig  ? aspirin EC 81 MG tablet Take 81 mg by mouth daily.   ? Calcium Carbonate-Vit D-Min (CALCIUM 1200 PO) Take by mouth. Per patient taking 400 mg daily  ? diltiazem (CARDIZEM CD) 120 MG 24 hr capsule TAKE 1 CAPSULE(120 MG) BY MOUTH DAILY  ? ezetimibe (ZETIA) 10 MG tablet Take 1 tablet (10 mg total) by mouth daily.  ? hydroxyurea (HYDREA) 500  MG capsule TAKE 1 CAPSULE BY MOUTH EVERY DAY  ? Omega-3 Fatty Acids (FISH OIL) 1000 MG CAPS Take by mouth in the morning and at bedtime.  ? rosuvastatin (CRESTOR) 40 MG tablet Take 1 tablet (40 mg total) by mouth daily. Please advise pt to make appt with provider for further refills  ? tamsulosin (FLOMAX) 0.4 MG CAPS capsule Take 0.4 mg by mouth daily.  ? triamcinolone (KENALOG) 0.1 % Apply topically 2 (two) times daily.  ?  triamcinolone cream (KENALOG) 0.5 % Apply 1 application. topically as needed (rash).  ?  ? ?Allergies:   Zocor [simvastatin]  ? ?Social History  ? ?Socioeconomic History  ? Marital status: Married  ?  Spouse name: Not on file  ? Number of children: Not on file  ? Years of education: Not on file  ? Highest education level: Not on file  ?Occupational History  ? Not on file  ?Tobacco Use  ? Smoking status: Former  ?  Types: Cigarettes  ?  Quit date: 04/17/1979  ?  Years since quitting: 42.8  ? Smokeless tobacco: Never  ?Vaping Use  ? Vaping Use: Never used  ?Substance and Sexual Activity  ? Alcohol use: Yes  ?  Alcohol/week: 12.0 standard drinks  ?  Types: 12 Cans of beer per week  ? Drug use: No  ? Sexual activity: Not on file  ?Other Topics Concern  ? Not on file  ?Social History Narrative  ? The patient is married. He lives at [facility]. Retired since 2012. Remote smoker.  ? ?Social Determinants of Health  ? ?Financial Resource Strain: Not on file  ?Food Insecurity: Not on file  ?Transportation Needs: Not on file  ?Physical Activity: Not on file  ?Stress: Not on file  ?Social Connections: Not on file  ?  ? ?Family History: ?The patient's family history includes Colon cancer in his maternal uncle; Heart attack in his maternal grandfather, maternal uncle, maternal uncle, and maternal uncle; Heart disease in his sister. ? ?ROS:   ?Please see the history of present illness.    ?All other systems reviewed and are negative. ? ?EKGs/Labs/Other Studies Reviewed:   ? ?The following studies were reviewed today: ?Event monitor 06/10/2020: ?The basic rhythm is normal sinus with an average HR of 69 bpm ?No atrial fibrillation or flutter ?No high-grade heart block or pathologic pauses ?There are occasional PVC's and rare supraventricular beats without sustained arrhythmias. There is a single ventricular run of 8 beats. ? ?Myoview Scan 01/29/20: ?The left ventricular ejection fraction is hyperdynamic (>65%). ?Nuclear stress EF:  66%. ?There was no ST segment deviation noted during stress. ?The study is normal. ?This is a low risk study. ?No change from prior study. ? ?EKG:  EKG is ordered today.  The ekg ordered today demonstrates normal sinus rhythm 70 bpm, within normal limits ? ?Recent Labs: ?05/30/2021: ALT 16 ?12/06/2021: Hemoglobin 15.6; Platelets 231  ?Recent Lipid Panel ?   ?Component Value Date/Time  ? CHOL 147 05/30/2021 0928  ? TRIG 101 05/30/2021 0928  ? HDL 54 05/30/2021 0928  ? CHOLHDL 2.7 05/30/2021 0928  ? CHOLHDL 3.1 07/13/2016 0832  ? VLDL 21 07/13/2016 0832  ? Ingram 74 05/30/2021 0928  ? ? ? ?Risk Assessment/Calculations:   ?  ? ?    ? ?Physical Exam:   ? ?VS:  BP 120/76   Pulse 70   Ht '5\' 11"'$  (1.803 m)   Wt 270 lb 9.6 oz (122.7 kg)   SpO2 97%  BMI 37.74 kg/m?    ? ?Wt Readings from Last 3 Encounters:  ?01/26/22 270 lb 9.6 oz (122.7 kg)  ?12/06/21 269 lb 11.2 oz (122.3 kg)  ?08/05/21 263 lb 12.8 oz (119.7 kg)  ?  ? ?GEN:  Well nourished, well developed in no acute distress ?HEENT: Normal ?NECK: No JVD; No carotid bruits ?LYMPHATICS: No lymphadenopathy ?CARDIAC: RRR, no murmurs, rubs, gallops ?RESPIRATORY:  Clear to auscultation without rales, wheezing or rhonchi  ?ABDOMEN: Soft, non-tender, non-distended ?MUSCULOSKELETAL:  No edema; No deformity  ?SKIN: Warm and dry ?NEUROLOGIC:  Alert and oriented x 3 ?PSYCHIATRIC:  Normal affect  ? ?ASSESSMENT:   ? ?1. Coronary artery disease involving native coronary artery of native heart without angina pectoris   ?2. Mixed hyperlipidemia   ?3. Essential hypertension   ?4. Paroxysmal atrial fibrillation (HCC)   ? ?PLAN:   ? ?In order of problems listed above: ? ?Stable without any symptoms of angina.  Most recent stress test is reviewed as outlined above.  He continues on aspirin for antiplatelet therapy and a high intensity statin drug. ?LDL cholesterol on last check was 74 mg/dL.  He has not made any headway with diet and lifestyle modification and does not anticipate that he  will do better with this.  Considering his coronary risk profile, I think we should try to push his LDL cholesterol down below 70 at a minimum.  I have recommended adding Zetia 10 mg daily to his regimen.  We will repea

## 2022-01-26 NOTE — Patient Instructions (Signed)
Medication Instructions:  ?START Ezetimbe (Zetia) '10mg'$  daily ?*If you need a refill on your cardiac medications before your next appointment, please call your pharmacy* ? ? ?Lab Work: ?LIPIDS, LFT's in 3-4 months ?If you have labs (blood work) drawn today and your tests are completely normal, you will receive your results only by: ?MyChart Message (if you have MyChart) OR ?A paper copy in the mail ?If you have any lab test that is abnormal or we need to change your treatment, we will call you to review the results. ? ? ?Testing/Procedures: ?NONE ? ? ?Follow-Up: ?At [facility], you and your health needs are our priority.  As part of our continuing mission to provide you with exceptional heart care, we have created designated Provider Care Teams.  These Care Teams include your primary Cardiologist (physician) and Advanced Practice Providers (APPs -  Physician Assistants and Nurse Practitioners) who all work together to provide you with the care you need, when you need it. ? ?Your next appointment:   ?1 year(s) ? ?The format for your next appointment:   ?In Person ? ?Provider:   ?Sherren Mocha, MD   ? ?  ?

## 2022-02-23 DIAGNOSIS — D692 Other nonthrombocytopenic purpura: Secondary | ICD-10-CM | POA: Diagnosis not present

## 2022-02-23 DIAGNOSIS — I480 Paroxysmal atrial fibrillation: Secondary | ICD-10-CM | POA: Diagnosis not present

## 2022-02-23 DIAGNOSIS — D45 Polycythemia vera: Secondary | ICD-10-CM | POA: Diagnosis not present

## 2022-02-23 DIAGNOSIS — C61 Malignant neoplasm of prostate: Secondary | ICD-10-CM | POA: Diagnosis not present

## 2022-03-08 ENCOUNTER — Other Ambulatory Visit: Payer: Self-pay | Admitting: Family Medicine

## 2022-03-10 ENCOUNTER — Other Ambulatory Visit: Payer: Self-pay | Admitting: Cardiovascular Disease

## 2022-04-13 DIAGNOSIS — D692 Other nonthrombocytopenic purpura: Secondary | ICD-10-CM | POA: Diagnosis not present

## 2022-04-13 DIAGNOSIS — E7800 Pure hypercholesterolemia, unspecified: Secondary | ICD-10-CM | POA: Diagnosis not present

## 2022-04-13 DIAGNOSIS — C61 Malignant neoplasm of prostate: Secondary | ICD-10-CM | POA: Diagnosis not present

## 2022-04-13 DIAGNOSIS — I2510 Atherosclerotic heart disease of native coronary artery without angina pectoris: Secondary | ICD-10-CM | POA: Diagnosis not present

## 2022-05-15 ENCOUNTER — Telehealth: Payer: Self-pay | Admitting: Cardiovascular Disease

## 2022-05-15 NOTE — Telephone Encounter (Signed)
Patient states that he is 76lb overweight and feels like he is not able to get the weight off by himself by "cutting back." Patient asking about Keto diet. Advised that Dr Burt Knack generally, doesn't recommend this diet as it's very high in fats and generally, not heart health. Advised that we promote the mediterranean diet. Pt states he will look into that, appreciates the advice, and understands to call back if he needs/wants anything else.

## 2022-05-15 NOTE — Telephone Encounter (Signed)
Patient calling in about a weight loss program and would like to speak to the nurse about it. Please advise

## 2022-05-29 ENCOUNTER — Other Ambulatory Visit: Payer: Self-pay

## 2022-05-29 DIAGNOSIS — I2510 Atherosclerotic heart disease of native coronary artery without angina pectoris: Secondary | ICD-10-CM

## 2022-05-29 DIAGNOSIS — E782 Mixed hyperlipidemia: Secondary | ICD-10-CM

## 2022-05-29 MED ORDER — EZETIMIBE 10 MG PO TABS: 10.0000 mg | ORAL_TABLET | Freq: Every day | ORAL | 2 refills | Status: DC

## 2022-06-05 ENCOUNTER — Ambulatory Visit: Payer: Medicare Other | Admitting: Oncology

## 2022-06-05 ENCOUNTER — Other Ambulatory Visit: Payer: Medicare Other

## 2022-06-12 NOTE — Progress Notes (Signed)
[facility]  [address] 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  He receives occasional phlebotomies to keep his hematocrit under 45.  He is also on hydroxyurea 500 mg daily to keep his white cells and platelets from becoming more elevated over time.  He comes in today to reassess his peripheral counts.  Since his last visit, the patient has been doing fairly well.  With respect to his polycythemia rubra vera, he denies having any hyperviscosity-related symptoms from this underlying condition.  PHYSICAL EXAM:  Blood pressure (!) 125/58, pulse (!) 52, temperature 97.9 F (36.6 C), resp. rate 16, height '5\' 11"'$  (1.803 m), weight 271 lb 9.6 oz (123.2 kg), SpO2 96 %. Wt Readings from Last 3 Encounters:  06/13/22 271 lb 9.6 oz (123.2 kg)  01/26/22 270 lb 9.6 oz (122.7 kg)  12/06/21 269 lb 11.2 oz (122.3 kg)   Body mass index is 37.88 kg/m. Performance status (ECOG): 0 Physical Exam Constitutional:      Appearance: Normal appearance. He is not ill-appearing.  HENT:     Mouth/Throat:     Mouth: Mucous membranes are moist.     Pharynx: Oropharynx is clear. No oropharyngeal exudate or posterior oropharyngeal erythema.  Cardiovascular:     Rate and Rhythm: Regular rhythm. Bradycardia present.     Heart sounds: No murmur heard.    No friction rub. No gallop.  Pulmonary:     Effort: Pulmonary effort is normal. No respiratory distress.     Breath sounds: Normal breath sounds. No wheezing, rhonchi or rales.  Abdominal:     General: Bowel sounds are normal. There is no distension.     Palpations: Abdomen is soft. There is no mass.     Tenderness: There is no abdominal tenderness.  Musculoskeletal:        General: No swelling.     Right lower leg: No edema.     Left lower leg: No edema.   Lymphadenopathy:     Cervical: No cervical adenopathy.     Upper Body:     Right upper body: No supraclavicular or axillary adenopathy.     Left upper body: No supraclavicular or axillary adenopathy.     Lower Body: No right inguinal adenopathy. No left inguinal adenopathy.  Skin:    General: Skin is warm.     Coloration: Skin is not jaundiced.     Findings: No lesion or rash.  Neurological:     General: No focal deficit present.     Mental Status: He is alert and oriented to person, place, and time. Mental status is at baseline.  Psychiatric:        Mood and Affect: Mood normal.        Behavior: Behavior normal.        Thought Content: Thought content normal.    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  As his hematocrit is above 45, he needs to be phlebotomized.  He  knows to go to a phlebotomy bus in [place] to have this done.  I would prefer for him to be phlebotomized this week, as well as in another 3 months.  Otherwise, I remain pleased with the levels of his white cells and platelets.  He knows to continue taking hydroxyurea 500 mg daily.  He also knows to continue taking a baby aspirin on a daily basis to offset any potential clotting complications his polycythemia rubra vera can cause.  I will see him back in 6 months for repeat clinical assessment.  The patient understands all the plans discussed today and is in agreement with them.    Damara Klunder Macarthur Critchley, MD

## 2022-06-13 ENCOUNTER — Telehealth: Payer: Self-pay | Admitting: Oncology

## 2022-06-13 ENCOUNTER — Other Ambulatory Visit: Payer: Self-pay | Admitting: Oncology

## 2022-06-13 ENCOUNTER — Inpatient Hospital Stay: Payer: Medicare Other | Attending: Oncology

## 2022-06-13 ENCOUNTER — Inpatient Hospital Stay: Payer: Medicare Other | Admitting: Oncology

## 2022-06-13 VITALS — BP 125/58 | HR 52 | Temp 97.9°F | Resp 16 | Ht 71.0 in | Wt 271.6 lb

## 2022-06-13 DIAGNOSIS — D45 Polycythemia vera: Secondary | ICD-10-CM

## 2022-06-13 DIAGNOSIS — D649 Anemia, unspecified: Secondary | ICD-10-CM | POA: Diagnosis not present

## 2022-06-13 LAB — CBC AND DIFFERENTIAL
HCT: 48 (ref 41–53)
Hemoglobin: 16.1 (ref 13.5–17.5)
Neutrophils Absolute: 7.45
Platelets: 246 10*3/uL (ref 150–400)
WBC: 9.2

## 2022-06-13 LAB — CBC: RBC: 4.92 (ref 3.87–5.11)

## 2022-06-13 NOTE — Telephone Encounter (Signed)
Per 06/13/22 los next appt scheduled and confirmed with patient

## 2022-08-29 DIAGNOSIS — K5909 Other constipation: Secondary | ICD-10-CM | POA: Diagnosis not present

## 2022-08-29 DIAGNOSIS — I480 Paroxysmal atrial fibrillation: Secondary | ICD-10-CM | POA: Diagnosis not present

## 2022-08-29 DIAGNOSIS — C61 Malignant neoplasm of prostate: Secondary | ICD-10-CM | POA: Diagnosis not present

## 2022-08-29 DIAGNOSIS — D45 Polycythemia vera: Secondary | ICD-10-CM | POA: Diagnosis not present

## 2022-11-14 ENCOUNTER — Encounter: Payer: Self-pay | Admitting: Oncology

## 2022-11-20 ENCOUNTER — Other Ambulatory Visit: Payer: Self-pay | Admitting: Hematology and Oncology

## 2022-11-20 DIAGNOSIS — D45 Polycythemia vera: Secondary | ICD-10-CM

## 2022-11-30 DIAGNOSIS — I10 Essential (primary) hypertension: Secondary | ICD-10-CM | POA: Diagnosis not present

## 2022-11-30 DIAGNOSIS — C61 Malignant neoplasm of prostate: Secondary | ICD-10-CM | POA: Diagnosis not present

## 2022-11-30 DIAGNOSIS — S6991XA Unspecified injury of right wrist, hand and finger(s), initial encounter: Secondary | ICD-10-CM | POA: Diagnosis not present

## 2022-11-30 DIAGNOSIS — D45 Polycythemia vera: Secondary | ICD-10-CM | POA: Diagnosis not present

## 2022-11-30 DIAGNOSIS — E7800 Pure hypercholesterolemia, unspecified: Secondary | ICD-10-CM | POA: Diagnosis not present

## 2022-11-30 DIAGNOSIS — I480 Paroxysmal atrial fibrillation: Secondary | ICD-10-CM | POA: Diagnosis not present

## 2022-11-30 DIAGNOSIS — Z79899 Other long term (current) drug therapy: Secondary | ICD-10-CM | POA: Diagnosis not present

## 2022-12-14 ENCOUNTER — Ambulatory Visit: Payer: Medicare Other | Admitting: Oncology

## 2022-12-14 ENCOUNTER — Other Ambulatory Visit: Payer: Medicare Other

## 2022-12-27 DIAGNOSIS — I480 Paroxysmal atrial fibrillation: Secondary | ICD-10-CM | POA: Diagnosis not present

## 2022-12-27 DIAGNOSIS — C61 Malignant neoplasm of prostate: Secondary | ICD-10-CM | POA: Diagnosis not present

## 2022-12-27 DIAGNOSIS — E7800 Pure hypercholesterolemia, unspecified: Secondary | ICD-10-CM | POA: Diagnosis not present

## 2023-01-02 DIAGNOSIS — K080 Exfoliation of teeth due to systemic causes: Secondary | ICD-10-CM | POA: Diagnosis not present

## 2023-01-15 ENCOUNTER — Other Ambulatory Visit: Payer: Self-pay

## 2023-01-15 ENCOUNTER — Ambulatory Visit: Payer: Self-pay | Admitting: Oncology

## 2023-01-23 DIAGNOSIS — H524 Presbyopia: Secondary | ICD-10-CM | POA: Diagnosis not present

## 2023-01-23 DIAGNOSIS — H353131 Nonexudative age-related macular degeneration, bilateral, early dry stage: Secondary | ICD-10-CM | POA: Diagnosis not present

## 2023-01-23 DIAGNOSIS — H40013 Open angle with borderline findings, low risk, bilateral: Secondary | ICD-10-CM | POA: Diagnosis not present

## 2023-01-30 ENCOUNTER — Ambulatory Visit: Payer: Medicare Other | Attending: Cardiovascular Disease | Admitting: Cardiovascular Disease

## 2023-01-30 ENCOUNTER — Encounter: Payer: Self-pay | Admitting: Cardiovascular Disease

## 2023-01-30 VITALS — BP 120/80 | HR 61 | Ht 71.0 in | Wt 269.8 lb

## 2023-01-30 DIAGNOSIS — E782 Mixed hyperlipidemia: Secondary | ICD-10-CM

## 2023-01-30 DIAGNOSIS — I10 Essential (primary) hypertension: Secondary | ICD-10-CM

## 2023-01-30 DIAGNOSIS — I480 Paroxysmal atrial fibrillation: Secondary | ICD-10-CM | POA: Diagnosis not present

## 2023-01-30 DIAGNOSIS — I25119 Atherosclerotic heart disease of native coronary artery with unspecified angina pectoris: Secondary | ICD-10-CM | POA: Diagnosis not present

## 2023-01-30 NOTE — Patient Instructions (Signed)
Medication Instructions:  Your physician recommends that you continue on your current medications as directed. Please refer to the Current Medication list given to you today.  *If you need a refill on your cardiac medications before your next appointment, please call your pharmacy*   Lab Work: CMET, Lipids ([place]) If you have labs (blood work) drawn today and your tests are completely normal, you will receive your results only by: [place] (if you have MyChart) OR A paper copy in the mail If you have any lab test that is abnormal or we need to change your treatment, we will call you to review the results.   Testing/Procedures: NONE   Follow-Up: At [facility], you and your health needs are our priority.  As part of our continuing mission to provide you with exceptional heart care, we have created designated Provider Care Teams.  These Care Teams include your primary Cardiologist (physician) and Advanced Practice Providers (APPs -  Physician Assistants and Nurse Practitioners) who all work together to provide you with the care you need, when you need it.  We recommend signing up for the patient portal called "MyChart".  Sign up information is provided on this After Visit Summary.  MyChart is used to connect with patients for Virtual Visits (Telemedicine).  Patients are able to view lab/test results, encounter notes, upcoming appointments, etc.  Non-urgent messages can be sent to your provider as well.   To learn more about what you can do with MyChart, go to [email or web address].    Your next appointment:   1 year(s)  Provider:   Sherren Mocha, MD

## 2023-01-30 NOTE — Progress Notes (Signed)
Cardiology Office Note:    Date:  01/30/2023   ID:  Leonard Washington, DOB 1946/02/12, MRN [id number]  PCP:  Serita Grammes, MD   [facility] Cardiologist:  Sherren Mocha, MD     Referring MD: Serita Grammes, MD   Chief Complaint  Patient presents with   Coronary Artery Disease    History of Present Illness:    Leonard Washington is a 77 y.o. male with a hx of coronary artery disease status post remote CABG, presenting for follow-up evaluation.  The patient underwent four-vessel CABG in 2005 with a LIMA to LAD, left radial to RCA, saphenous vein graft to ramus intermedius, and saphenous vein graft to left circumflex marginal.  Comorbid conditions include hypertension and mixed hyperlipidemia.  Noncardiac issues include prostate cancer and polycythemia vera.  The patient was hospitalized in March 2021 with COVID-19 pneumonia.  His illness was complicated by demand ischemia and atrial fibrillation.  He was anticoagulated with apixaban and converted spontaneously back to sinus rhythm.  Outpatient event monitoring once he recovered demonstrated no recurrence of atrial fibrillation.  He has been transitioned back to antiplatelet therapy with aspirin 81 mg daily.  He underwent a stress Myoview scan demonstrating no ischemia.  He was last seen for office follow-up January 26, 2022 at which time he was doing well.  The patient is here alone today.  He has been getting along well.  He has not engaged in aerobic exercise, but tries to stay active.  He is outdoors a lot.  He throws horseshoes and does other activities with friends.  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  He denies orthopnea, PND, or heart palpitations.  He has had some mild leg swelling but no significant change over time.  Past Medical History:  Diagnosis Date   BPH (benign prostatic hyperplasia)    Cataract 2016   Had Bil surgery   Chest congestion 11/29/2018   Coronary atherosclerosis of native  coronary artery    Vessel CABG 2005   Hyperlipidemia    Hypertension    Myocardial infarction 1997   Had stent put in 197/CABG in2005   Shoulder pain    right  side   SOB (shortness of breath) on exertion     Past Surgical History:  Procedure Laterality Date   ARTERIAL DUPLEX  04/19/2004   Carotid-no evidence of significant plaque noted, no evidence of significant ICA stenosis, artery flow is antegrade. Upper extremities-within normal limits. Lower extremities-no evidence of lower extremity arterial occlusive disease at rest   BLADDER SURGERY     had a growth in bladder/benign   CARDIAC CATHETERIZATION  04/19/2004   severe four vessel coronary disease, continue vigorous medical therapy, recommendations for multivessel bypass grafting   CARDIOVASCULAR STRESS TEST  07/01/2009   normal pattern of perfusion in all regions, no scintigraphic evidence of inducible myocardial ischemia,EKG negative for ischemia, no ECG changes,   CATARACT EXTRACTION, BILATERAL     CORONARY ARTERY BYPASS GRAFT  04/20/2004   x4. LIMA to LAD, left radial artery to RCA, SVG to ramus intermedius, SVG to circ.   DOPPLER ECHOCARDIOGRAPHY  11/11/2012   EF 55-60%, wall motion normal, no regional wall motion abnormalities, thickness of ventricular septum was mildly increased, mild regurg of the mitral valve    Current Medications: Current Meds  Medication Sig   aspirin EC 81 MG tablet Take 81 mg by mouth daily.    Calcium Carbonate-Vit D-Min (CALCIUM 1200 PO) Take by mouth. Per  patient taking 400 mg daily   diltiazem (CARDIZEM CD) 120 MG 24 hr capsule TAKE 1 CAPSULE(120 MG) BY MOUTH DAILY   ezetimibe (ZETIA) 10 MG tablet Take 1 tablet (10 mg total) by mouth daily.   hydroxyurea (HYDREA) 500 MG capsule TAKE 1 CAPSULE BY MOUTH EVERY DAY   metoprolol tartrate (LOPRESSOR) 100 MG tablet Take 100 mg by mouth daily. Per patient taking 1/2 tablet (50 mg) of 100 mg daily   rosuvastatin (CRESTOR) 40 MG tablet Take 1 tablet (40 mg  total) by mouth daily. Please advise pt to make appt with provider for further refills   SSD 1 % cream Apply topically as needed.   triamcinolone (KENALOG) 0.1 % Apply topically as needed.     Allergies:   Zocor [simvastatin]   Social History   Socioeconomic History   Marital status: Married    Spouse name: Not on file   Number of children: Not on file   Years of education: Not on file   Highest education level: Not on file  Occupational History   Not on file  Tobacco Use   Smoking status: Former    Types: Cigarettes    Quit date: 04/17/1979    Years since quitting: 43.8   Smokeless tobacco: Never  Vaping Use   Vaping Use: Never used  Substance and Sexual Activity   Alcohol use: Yes    Alcohol/week: 12.0 standard drinks of alcohol    Types: 12 Cans of beer per week   Drug use: No   Sexual activity: Not on file  Other Topics Concern   Not on file  Social History Narrative   The patient is married. He lives at [facility]. Retired since 2012. Remote smoker.   Social Determinants of Health   Financial Resource Strain: Not on file  Food Insecurity: Not on file  Transportation Needs: Not on file  Physical Activity: Not on file  Stress: Not on file  Social Connections: Not on file     Family History: The patient's family history includes Colon cancer in his maternal uncle; Heart attack in his maternal grandfather, maternal uncle, maternal uncle, and maternal uncle; Heart disease in his sister.  ROS:   Please see the history of present illness.    All other systems reviewed and are negative.  EKGs/Labs/Other Studies Reviewed:    The following studies were reviewed today: Cardiac Studies & Procedures     STRESS TESTS  MYOCARDIAL PERFUSION IMAGING 01/29/2020  Narrative  The left ventricular ejection fraction is hyperdynamic (>65%).  Nuclear stress EF: 66%.  There was no ST segment deviation noted during stress.  The study is normal.  This is a low risk  study.  No change from prior study.   ECHOCARDIOGRAM  ECHOCARDIOGRAM LIMITED 01/09/2020  Narrative ECHOCARDIOGRAM LIMITED REPORT    Patient Name:   Leonard Washington Date of Exam: 01/02/2020 Medical Rec #:  [id number]             Height:       71.0 in Accession #:    [id number]            Weight:       253.7 lb Date of Birth:  13-Feb-1946              BSA:          2.333 m Patient Age:    65 years              BP:  121/69 mmHg Patient Gender: M                     HR:           77 bpm. Exam Location:  Inpatient  Procedure: Limited Echo, Cardiac Doppler and Limited Color Doppler  MODIFIED REPORT: This report was modified by Glori Bickers MD on 01/09/2020 due to fix report. Indications:     R94.31 Abnormal EKG  History:         Patient has prior history of Echocardiogram examinations, most recent 07/27/2016. Previous Myocardial Infarction, Signs/Symptoms:Dyspnea; Risk Factors:Hypertension and Dyslipidemia. COVID19 Positive.  Sonographer:     Jonelle Sidle Dance Referring Phys:  [id number] DAVID Diagnosing Phys: Glori Bickers MD  IMPRESSIONS   1. Left ventricular ejection fraction, by estimation, is 60 to 65%. The left ventricle has normal function. The left ventricle has no regional wall motion abnormalities. There is mild left ventricular hypertrophy. Tissue doppler not performed. 2. The right ventricular size is normal. 3. Left atrial size was mild to moderately dilated. 4. Right atrial size was mildly dilated. 5. The mitral valve is grossly normal. Trivial mitral valve regurgitation. 6. The aortic valve is tricuspid. Aortic valve regurgitation is not visualized. Mild aortic valve sclerosis is present, with no evidence of aortic valve stenosis. 7. Limited images due to poor acoustic windowsL  FINDINGS Left Ventricle: Left ventricular ejection fraction, by estimation, is 60 to 65%. The left ventricle has normal function. The left ventricle has no regional wall  motion abnormalities. There is mild left ventricular hypertrophy.  Right Ventricle: The right ventricular size is normal. No increase in right ventricular wall thickness.  Left Atrium: Left atrial size was mild to moderately dilated.  Right Atrium: Right atrial size was mildly dilated.  Pericardium: There is no evidence of pericardial effusion.  Mitral Valve: The mitral valve is grossly normal. Trivial mitral valve regurgitation.  Tricuspid Valve: The tricuspid valve is normal in structure. Tricuspid valve regurgitation is not demonstrated.  Aortic Valve: The aortic valve is tricuspid. Aortic valve regurgitation is not visualized. Mild aortic valve sclerosis is present, with no evidence of aortic valve stenosis. There is mild calcification of the aortic valve.  Pulmonic Valve: The pulmonic valve was grossly normal. Pulmonic valve regurgitation is trivial.  Aorta: The aortic root and ascending aorta are structurally normal, with no evidence of dilitation.  Venous: The inferior vena cava was not well visualized.   LEFT VENTRICLE PLAX 2D LVIDd:         4.50 cm LVIDs:         4.10 cm LV PW:         1.20 cm LV IVS:        1.00 cm LVOT diam:     2.20 cm LV SV:         84 LV SV Index:   36 LVOT Area:     3.80 cm   RIGHT VENTRICLE          IVC RV Basal diam:  2.70 cm  IVC diam: 1.90 cm  LEFT ATRIUM             Index       RIGHT ATRIUM           Index LA diam:        4.70 cm 2.01 cm/m  RA Area:     14.70 cm LA Vol (A2C):   60.4 ml 25.89 ml/m RA Volume:   36.50 ml  15.64 ml/m LA Vol (A4C):   52.8 ml 22.63 ml/m LA Biplane Vol: 58.3 ml 24.99 ml/m AORTIC VALVE LVOT Vmax:   123.00 cm/s LVOT Vmean:  75.000 cm/s LVOT VTI:    0.222 m  AORTA Ao Root diam: 3.90 cm Ao Asc diam:  3.20 cm  MITRAL VALVE MV Area (PHT): 3.72 cm    SHUNTS MV Decel Time: 204 msec    Systemic VTI:  0.22 m MV E velocity: 76.60 cm/s  Systemic Diam: 2.20 cm MV A velocity: 72.80 cm/s MV E/A ratio:   1.05  Glori Bickers MD Electronically signed by Glori Bickers MD Signature Date/Time: 01/02/2020/2:30:39 PM    Final (Updated)    [place] 06/22/2020  Narrative 1. The basic rhythm is normal sinus with an average HR of 69 bpm 2. No atrial fibrillation or flutter 3. No high-grade heart block or pathologic pauses 4. There are occasional PVC's and rare supraventricular beats without sustained arrhythmias. There is a single ventricular run of 8 beats.            EKG:  EKG is ordered today.  The ekg ordered today demonstrates NSR 61 bpm, cannot rule out inferior infarct age undetmined  Recent Labs: 06/13/2022: Hemoglobin 16.1; Platelets 246  Recent Lipid Panel    Component Value Date/Time   CHOL 147 05/30/2021 0928   TRIG 101 05/30/2021 0928   HDL 54 05/30/2021 0928   CHOLHDL 2.7 05/30/2021 0928   CHOLHDL 3.1 07/13/2016 0832   VLDL 21 07/13/2016 0832   LDLCALC 74 05/30/2021 0928     Risk Assessment/Calculations:                Physical Exam:    VS:  BP 120/80   Pulse 61   Ht 5\' 11"  (1.803 m)   Wt 269 lb 12.8 oz (122.4 kg)   SpO2 97%   BMI 37.63 kg/m     Wt Readings from Last 3 Encounters:  01/30/23 269 lb 12.8 oz (122.4 kg)  06/13/22 271 lb 9.6 oz (123.2 kg)  01/26/22 270 lb 9.6 oz (122.7 kg)     GEN:  Well nourished, well developed in no acute distress HEENT: Normal NECK: No JVD; No carotid bruits LYMPHATICS: No lymphadenopathy CARDIAC: RRR, no murmurs, rubs, gallops RESPIRATORY:  Clear to auscultation without rales, wheezing or rhonchi  ABDOMEN: Soft, non-tender, non-distended MUSCULOSKELETAL:  No edema; No deformity  SKIN: Warm and dry NEUROLOGIC:  Alert and oriented x 3 PSYCHIATRIC:  Normal affect   ASSESSMENT:    1. Coronary artery disease involving native coronary artery of native heart with angina pectoris   2. Mixed hyperlipidemia   3. Essential hypertension   4. Paroxysmal atrial fibrillation    PLAN:     In order of problems listed above:  The patient is doing well with no angina.  He will continue on aspirin for antiplatelet therapy, a combination of diltiazem and metoprolol for antianginal therapy, and a high intensity statin drug. Blood pressure is controlled on current medical therapy.  Continue the same. No recurrence since he had an isolated episode of atrial fibrillation when he was hospitalized with COVID-19 pneumonia.  Subsequent event monitoring showed no atrial fibrillation.  He is no longer anticoagulated and has had no recurrence over the last several years. The patient was started on ezetimibe last year in addition to his rosuvastatin.  He is due for lipids and LFTs.  Lifestyle modification discussed.  Will order lipids along with a metabolic  panel.  Goal LDL cholesterol is less than 70 mg/dL.     Medication Adjustments/Labs and Tests Ordered: Current medicines are reviewed at length with the patient today.  Concerns regarding medicines are outlined above.  Orders Placed This Encounter  Procedures   Comprehensive metabolic panel   Lipid panel   EKG 12-Lead   No orders of the defined types were placed in this encounter.   Patient Instructions  Medication Instructions:  Your physician recommends that you continue on your current medications as directed. Please refer to the Current Medication list given to you today.  *If you need a refill on your cardiac medications before your next appointment, please call your pharmacy*   Lab Work: CMET, Lipids ([place]) If you have labs (blood work) drawn today and your tests are completely normal, you will receive your results only by: [facility] (if you have MyChart) OR A paper copy in the mail If you have any lab test that is abnormal or we need to change your treatment, we will call you to review the results.   Testing/Procedures: NONE   Follow-Up: At [facility], you and your health needs are our priority.   As part of our continuing mission to provide you with exceptional heart care, we have created designated Provider Care Teams.  These Care Teams include your primary Cardiologist (physician) and Advanced Practice Providers (APPs -  Physician Assistants and Nurse Practitioners) who all work together to provide you with the care you need, when you need it.  We recommend signing up for the patient portal called "MyChart".  Sign up information is provided on this After Visit Summary.  MyChart is used to connect with patients for Virtual Visits (Telemedicine).  Patients are able to view lab/test results, encounter notes, upcoming appointments, etc.  Non-urgent messages can be sent to your provider as well.   To learn more about what you can do with MyChart, go to [email or web address].    Your next appointment:   1 year(s)  Provider:   Sherren Mocha, MD        Signed, Sherren Mocha, MD  01/30/2023 5:40 PM    [place]

## 2023-02-06 DIAGNOSIS — I480 Paroxysmal atrial fibrillation: Secondary | ICD-10-CM | POA: Diagnosis not present

## 2023-02-06 DIAGNOSIS — I25119 Atherosclerotic heart disease of native coronary artery with unspecified angina pectoris: Secondary | ICD-10-CM | POA: Diagnosis not present

## 2023-02-06 DIAGNOSIS — I10 Essential (primary) hypertension: Secondary | ICD-10-CM | POA: Diagnosis not present

## 2023-02-06 DIAGNOSIS — E782 Mixed hyperlipidemia: Secondary | ICD-10-CM | POA: Diagnosis not present

## 2023-02-07 LAB — COMPREHENSIVE METABOLIC PANEL
ALT: 22 IU/L (ref 0–44)
AST: 26 IU/L (ref 0–40)
Albumin/Globulin Ratio: 2.1 (ref 1.2–2.2)
Albumin: 4.1 g/dL (ref 3.8–4.8)
Alkaline Phosphatase: 85 IU/L (ref 44–121)
BUN/Creatinine Ratio: 13 (ref 10–24)
BUN: 13 mg/dL (ref 8–27)
Bilirubin Total: 0.8 mg/dL (ref 0.0–1.2)
CO2: 24 mmol/L (ref 20–29)
Calcium: 9.1 mg/dL (ref 8.6–10.2)
Chloride: 102 mmol/L (ref 96–106)
Creatinine, Ser: 0.99 mg/dL (ref 0.76–1.27)
Globulin, Total: 2 g/dL (ref 1.5–4.5)
Glucose: 90 mg/dL (ref 70–99)
Potassium: 4.7 mmol/L (ref 3.5–5.2)
Sodium: 141 mmol/L (ref 134–144)
Total Protein: 6.1 g/dL (ref 6.0–8.5)
eGFR: 78 mL/min/{1.73_m2} (ref >59)

## 2023-02-07 LAB — LIPID PANEL
Chol/HDL Ratio: 2.9 ratio (ref 0.0–5.0)
Cholesterol, Total: 117 mg/dL (ref 100–199)
HDL: 40 mg/dL (ref >39)
LDL Chol Calc (NIH): 54 mg/dL (ref 0–99)
Triglycerides: 132 mg/dL (ref 0–149)
VLDL Cholesterol Cal: 23 mg/dL (ref 5–40)

## 2023-02-13 ENCOUNTER — Inpatient Hospital Stay: Payer: Medicare Other

## 2023-02-13 ENCOUNTER — Inpatient Hospital Stay: Payer: Medicare Other | Admitting: Oncology

## 2023-02-26 NOTE — Progress Notes (Signed)
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  He receives occasional phlebotomies at outside facilities to keep his hematocrit under 45.  He is also on hydroxyurea 500 mg daily to keep his white cells and platelets from becoming more elevated over time.  He comes in today to reassess his peripheral counts.  Since his last visit, the patient has been doing fairly well.  However, he admits he has not been going for his necessary phlebotomies over these past months.  With respect to his polycythemia rubra vera, he denies having any hyperviscosity-related symptoms from this underlying condition.  PHYSICAL EXAM:  Blood pressure (!) 169/77, pulse 63, temperature 97.6 F (36.4 C), temperature source Oral, resp. rate 16, weight 268 lb (121.6 kg), SpO2 97 %. Wt Readings from Last 3 Encounters:  02/27/23 268 lb (121.6 kg)  01/30/23 269 lb 12.8 oz (122.4 kg)  06/13/22 271 lb 9.6 oz (123.2 kg)   Body mass index is 37.38 kg/m. Performance status (ECOG): 0 Physical Exam Constitutional:      Appearance: Normal appearance. He is not ill-appearing.  HENT:     Mouth/Throat:     Mouth: Mucous membranes are moist.     Pharynx: Oropharynx is clear. No oropharyngeal exudate or posterior oropharyngeal erythema.  Cardiovascular:     Rate and Rhythm: Regular rhythm. Bradycardia present.     Heart sounds: No murmur heard.    No friction rub. No gallop.  Pulmonary:     Effort: Pulmonary effort is normal. No respiratory distress.     Breath sounds: Normal breath sounds. No wheezing, rhonchi or rales.  Abdominal:     General: Bowel sounds are normal. There is no distension.     Palpations: Abdomen is soft. There is no mass.     Tenderness: There is no abdominal tenderness.  Musculoskeletal:         General: No swelling.     Right lower leg: No edema.     Left lower leg: No edema.  Lymphadenopathy:     Cervical: No cervical adenopathy.     Upper Body:     Right upper body: No supraclavicular or axillary adenopathy.     Left upper body: No supraclavicular or axillary adenopathy.     Lower Body: No right inguinal adenopathy. No left inguinal adenopathy.  Skin:    General: Skin is warm.     Coloration: Skin is not jaundiced.     Findings: No lesion or rash.  Neurological:     General: No focal deficit present.     Mental Status: He is alert and oriented to person, place, and time. Mental status is at baseline.  Psychiatric:        Mood and Affect: Mood normal.        Behavior: Behavior normal.        Thought Content: Thought content normal.    LABS:    Latest Reference Range & Units 02/27/23 00:00  WBC  8.7 (E)  RBC 3.87 - 5.11  5.29 ! (E)  Hemoglobin 13.5 - 17.5  17.1 (E)  HCT 41 - 53  52 (E)  Platelets 150 - 400 K/uL 284 (E)  !: Data is abnormal (E): External lab result  ASSESSMENT & PLAN:  Assessment/Plan:  A 77 y.o. male  with polycythemia rubra vera.  As his hematocrit is well above 45, he needs to be phlebotomized.  I will arrange for a phlebotomy at our infusion center tomorrow.  He knows I also want him to be phlebotomized in both June and July.  He knows to continue taking hydroxyurea 500 mg daily, which has been effective in keeping his white cells and platelets under ideal control.  He also knows to continue taking a baby aspirin on a daily basis to offset any potential clotting complications his polycythemia rubra vera can cause.  I will see him back in 4 months for repeat clinical assessment.  The patient understands all the plans discussed today and is in agreement with them.    Jurell Basista Kirby Funk, MD

## 2023-02-27 ENCOUNTER — Other Ambulatory Visit: Payer: Self-pay | Admitting: Oncology

## 2023-02-27 ENCOUNTER — Telehealth: Payer: Self-pay | Admitting: Oncology

## 2023-02-27 ENCOUNTER — Inpatient Hospital Stay: Payer: Medicare Other | Attending: Oncology

## 2023-02-27 ENCOUNTER — Inpatient Hospital Stay: Payer: Medicare Other | Admitting: Oncology

## 2023-02-27 VITALS — BP 169/77 | HR 63 | Temp 97.6°F | Resp 16 | Wt 268.0 lb

## 2023-02-27 DIAGNOSIS — D649 Anemia, unspecified: Secondary | ICD-10-CM | POA: Diagnosis not present

## 2023-02-27 DIAGNOSIS — D45 Polycythemia vera: Secondary | ICD-10-CM | POA: Diagnosis not present

## 2023-02-27 LAB — CBC: RBC: 5.29 — AB (ref 3.87–5.11)

## 2023-02-27 LAB — CBC AND DIFFERENTIAL
HCT: 52 (ref 41–53)
Hemoglobin: 17.1 (ref 13.5–17.5)
Neutrophils Absolute: 6.7
Platelets: 284 10*3/uL (ref 150–400)
WBC: 8.7

## 2023-02-27 NOTE — Telephone Encounter (Signed)
Patient has been scheduled for follow-up visit per 02/27/23 LOS.  Pt given an appt calendar with date and time.  

## 2023-02-28 ENCOUNTER — Inpatient Hospital Stay: Payer: Medicare Other

## 2023-03-01 ENCOUNTER — Inpatient Hospital Stay: Payer: Medicare Other | Attending: Oncology

## 2023-03-01 VITALS — BP 145/66 | HR 80 | Temp 98.1°F | Resp 18

## 2023-03-01 DIAGNOSIS — D45 Polycythemia vera: Secondary | ICD-10-CM | POA: Diagnosis not present

## 2023-03-01 NOTE — Patient Instructions (Signed)

## 2023-03-01 NOTE — Progress Notes (Signed)
Leonard Washington presents today for phlebotomy per MD orders. Phlebotomy procedure started at 1240 and ended at 1300. 400 grams removed. Patient tolerated procedure well. IV needle removed intact.

## 2023-03-02 ENCOUNTER — Encounter: Payer: Self-pay | Admitting: Oncology

## 2023-03-16 ENCOUNTER — Other Ambulatory Visit: Payer: Self-pay | Admitting: Cardiovascular Disease

## 2023-03-23 DIAGNOSIS — D45 Polycythemia vera: Secondary | ICD-10-CM | POA: Diagnosis not present

## 2023-03-23 DIAGNOSIS — Z951 Presence of aortocoronary bypass graft: Secondary | ICD-10-CM | POA: Diagnosis not present

## 2023-03-29 DIAGNOSIS — K5900 Constipation, unspecified: Secondary | ICD-10-CM | POA: Diagnosis not present

## 2023-03-29 DIAGNOSIS — C61 Malignant neoplasm of prostate: Secondary | ICD-10-CM | POA: Diagnosis not present

## 2023-03-29 DIAGNOSIS — N529 Male erectile dysfunction, unspecified: Secondary | ICD-10-CM | POA: Diagnosis not present

## 2023-04-10 ENCOUNTER — Encounter: Payer: Self-pay | Admitting: Gastroenterology

## 2023-04-19 DIAGNOSIS — E040 Nontoxic diffuse goiter: Secondary | ICD-10-CM | POA: Diagnosis not present

## 2023-04-19 DIAGNOSIS — Z79899 Other long term (current) drug therapy: Secondary | ICD-10-CM | POA: Diagnosis not present

## 2023-04-19 DIAGNOSIS — S66811S Strain of other specified muscles, fascia and tendons at wrist and hand level, right hand, sequela: Secondary | ICD-10-CM | POA: Diagnosis not present

## 2023-04-19 DIAGNOSIS — Z0000 Encounter for general adult medical examination without abnormal findings: Secondary | ICD-10-CM | POA: Diagnosis not present

## 2023-04-19 DIAGNOSIS — C61 Malignant neoplasm of prostate: Secondary | ICD-10-CM | POA: Diagnosis not present

## 2023-04-19 DIAGNOSIS — D45 Polycythemia vera: Secondary | ICD-10-CM | POA: Diagnosis not present

## 2023-04-24 ENCOUNTER — Other Ambulatory Visit: Payer: Self-pay | Admitting: Cardiovascular Disease

## 2023-04-24 DIAGNOSIS — I2510 Atherosclerotic heart disease of native coronary artery without angina pectoris: Secondary | ICD-10-CM

## 2023-04-24 DIAGNOSIS — E782 Mixed hyperlipidemia: Secondary | ICD-10-CM

## 2023-04-26 DIAGNOSIS — E040 Nontoxic diffuse goiter: Secondary | ICD-10-CM | POA: Diagnosis not present

## 2023-04-26 DIAGNOSIS — E042 Nontoxic multinodular goiter: Secondary | ICD-10-CM | POA: Diagnosis not present

## 2023-04-30 ENCOUNTER — Ambulatory Visit: Payer: Medicare Other | Admitting: Cardiovascular Disease

## 2023-05-11 DIAGNOSIS — K080 Exfoliation of teeth due to systemic causes: Secondary | ICD-10-CM | POA: Diagnosis not present

## 2023-05-15 ENCOUNTER — Inpatient Hospital Stay: Payer: Medicare Other | Attending: Oncology

## 2023-05-15 VITALS — BP 136/68 | HR 70 | Temp 97.7°F | Resp 20 | Ht 71.0 in | Wt 270.0 lb

## 2023-05-15 DIAGNOSIS — D45 Polycythemia vera: Secondary | ICD-10-CM | POA: Diagnosis not present

## 2023-05-15 DIAGNOSIS — Z79899 Other long term (current) drug therapy: Secondary | ICD-10-CM | POA: Insufficient documentation

## 2023-05-15 NOTE — Patient Instructions (Signed)

## 2023-05-15 NOTE — Progress Notes (Signed)
Leonard Washington presents today for phlebotomy per MD orders. Phlebotomy procedure started at 0911 and ended at 0921. 494 cc removed. Patient tolerated procedure well. IV needle removed intact.

## 2023-05-29 ENCOUNTER — Inpatient Hospital Stay: Payer: Medicare Other

## 2023-06-29 ENCOUNTER — Other Ambulatory Visit: Payer: Medicare Other

## 2023-06-29 ENCOUNTER — Ambulatory Visit: Payer: Medicare Other | Admitting: Oncology

## 2023-07-02 NOTE — Progress Notes (Unsigned)
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  He receives occasional phlebotomies at outside facilities to keep his hematocrit under 45.  He is also on hydroxyurea 500 mg daily to keep his white cells and platelets from becoming more elevated over time.  He comes in today to reassess his peripheral counts after receiving monthly phlebotomies every 3/4 months.   Since his last visit, the patient has been doing well. With respect to his polycythemia rubra vera, he denies having any hyperviscosity-related symptoms from this underlying condition.  PHYSICAL EXAM:  Blood pressure (!) 164/80, pulse 64, temperature 98.6 F (37 C), resp. rate 18, height 5\' 11"  (1.803 m), weight 270 lb 6.4 oz (122.7 kg), SpO2 97%. Wt Readings from Last 3 Encounters:  07/03/23 270 lb 6.4 oz (122.7 kg)  05/15/23 270 lb 0.6 oz (122.5 kg)  02/27/23 268 lb (121.6 kg)   Body mass index is 37.71 kg/m. Performance status (ECOG): 0 Physical Exam Constitutional:      Appearance: Normal appearance. He is not ill-appearing.  HENT:     Mouth/Throat:     Mouth: Mucous membranes are moist.     Pharynx: Oropharynx is clear. No oropharyngeal exudate or posterior oropharyngeal erythema.  Cardiovascular:     Rate and Rhythm: Normal rate and regular rhythm.     Heart sounds: No murmur heard.    No friction rub. No gallop.  Pulmonary:     Effort: Pulmonary effort is normal. No respiratory distress.     Breath sounds: Normal breath sounds. No wheezing, rhonchi or rales.  Abdominal:     General: Bowel sounds are normal. There is no distension.     Palpations: Abdomen is soft. There is no mass.     Tenderness: There is no abdominal tenderness.  Musculoskeletal:        General: No swelling.     Right lower leg:  No edema.     Left lower leg: No edema.  Lymphadenopathy:     Cervical: No cervical adenopathy.     Upper Body:     Right upper body: No supraclavicular or axillary adenopathy.     Left upper body: No supraclavicular or axillary adenopathy.     Lower Body: No right inguinal adenopathy. No left inguinal adenopathy.  Skin:    General: Skin is warm.     Coloration: Skin is not jaundiced.     Findings: No lesion or rash.  Neurological:     General: No focal deficit present.     Mental Status: He is alert and oriented to person, place, and time. Mental status is at baseline.  Psychiatric:        Mood and Affect: Mood normal.        Behavior: Behavior normal.        Thought Content: Thought content normal.    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  As his hematocrit is well above 45, he needs to continue to be phlebotomized. He will be phlebotomized monthly for the next 4 months to get his hematocrit less than 45.  He knows to continue taking hydroxyurea 500 mg daily, which has been effective in keeping his white cells and platelets under ideal control.  He  also knows to continue taking a baby aspirin on a daily basis to offset any potential clotting complications his polycythemia rubra vera can cause.  I will see him back in 4 months for repeat clinical assessment.  The patient understands all the plans discussed today and is in agreement with them.    Leonard Washington Leonard Funk, MD

## 2023-07-03 ENCOUNTER — Inpatient Hospital Stay: Payer: Medicare Other | Admitting: Oncology

## 2023-07-03 ENCOUNTER — Other Ambulatory Visit: Payer: Self-pay | Admitting: Oncology

## 2023-07-03 ENCOUNTER — Inpatient Hospital Stay: Payer: Medicare Other | Attending: Oncology

## 2023-07-03 VITALS — BP 164/80 | HR 64 | Temp 98.6°F | Resp 18 | Ht 71.0 in | Wt 270.4 lb

## 2023-07-03 DIAGNOSIS — D45 Polycythemia vera: Secondary | ICD-10-CM | POA: Diagnosis not present

## 2023-07-03 DIAGNOSIS — D649 Anemia, unspecified: Secondary | ICD-10-CM | POA: Diagnosis not present

## 2023-07-03 DIAGNOSIS — Z79899 Other long term (current) drug therapy: Secondary | ICD-10-CM | POA: Insufficient documentation

## 2023-07-03 LAB — CBC AND DIFFERENTIAL
HCT: 51 (ref 41–53)
Hemoglobin: 16.6 (ref 13.5–17.5)
Neutrophils Absolute: 8.66
Platelets: 283 10*3/uL (ref 150–400)
WBC: 11.1

## 2023-07-03 LAB — CBC: RBC: 5.92 — AB (ref 3.87–5.11)

## 2023-07-10 ENCOUNTER — Encounter: Payer: Self-pay | Admitting: Oncology

## 2023-07-10 ENCOUNTER — Inpatient Hospital Stay: Payer: Medicare Other

## 2023-07-10 VITALS — BP 125/85 | HR 64 | Temp 97.7°F | Resp 18 | Ht 71.0 in | Wt 268.1 lb

## 2023-07-10 DIAGNOSIS — D45 Polycythemia vera: Secondary | ICD-10-CM | POA: Diagnosis not present

## 2023-07-10 DIAGNOSIS — Z79899 Other long term (current) drug therapy: Secondary | ICD-10-CM | POA: Diagnosis not present

## 2023-07-10 NOTE — Patient Instructions (Signed)

## 2023-07-20 DIAGNOSIS — C61 Malignant neoplasm of prostate: Secondary | ICD-10-CM | POA: Diagnosis not present

## 2023-07-20 DIAGNOSIS — D692 Other nonthrombocytopenic purpura: Secondary | ICD-10-CM | POA: Diagnosis not present

## 2023-07-20 DIAGNOSIS — D45 Polycythemia vera: Secondary | ICD-10-CM | POA: Diagnosis not present

## 2023-07-20 DIAGNOSIS — I480 Paroxysmal atrial fibrillation: Secondary | ICD-10-CM | POA: Diagnosis not present

## 2023-07-20 DIAGNOSIS — Z23 Encounter for immunization: Secondary | ICD-10-CM | POA: Diagnosis not present

## 2023-08-06 ENCOUNTER — Ambulatory Visit: Payer: Medicare Other | Admitting: Gastroenterology

## 2023-08-06 ENCOUNTER — Encounter: Payer: Self-pay | Admitting: Gastroenterology

## 2023-08-06 VITALS — BP 108/62 | HR 60 | Ht 70.5 in | Wt 268.5 lb

## 2023-08-06 DIAGNOSIS — K5909 Other constipation: Secondary | ICD-10-CM

## 2023-08-06 DIAGNOSIS — Z860100 Personal history of colon polyps, unspecified: Secondary | ICD-10-CM | POA: Diagnosis not present

## 2023-08-06 NOTE — Progress Notes (Signed)
Chief Complaint: For colonoscopy/constipation  Referring Provider:  Buckner Malta, MD      ASSESSMENT AND PLAN;   #1. H/O polyps  #2. Chronic constipation.  Likely exacerbated by meds  Plan: -Miralax 17g po every day -Linzess (samples)-use it as needed -Colon with 2 day prep Jan 2025 -If still with problems, consider magnesium supplements -Increase water intake.   Discussed risks & benefits of colonoscopy. Risks including rare perforation req laparotomy, bleeding after bx/polypectomy req blood transfusion, rarely missing neoplasms, risks of anesthesia/sedation, rare risk of damage to internal organs. Benefits outweigh the risks. Patient agrees to proceed. All the questions were answered. Pt consents to proceed.  HPI:    Leonard Washington is a 77 y.o. male  With history of CAD s/p CABG (Nl EF 60 to 65% 2021), HTN, HLD, polycythemia vera, history of prostate cancer, history of A-fib-recovered, off AC.  C/O longstanding history of constipation getting worse over the last 1 year Also complains of occasional rectal bleeding mostly bright red especially if the stool is hard. Associated abdominal bloating Denies having any abdominal pain. Had Linzess with resultant diarrhea.  He uses it once a month.  Denies having any upper GI symptoms including nausea, heartburn, regurgitation, odynophagia or dysphagia.  He is due for repeat colonoscopy as below.  No recent weight loss or loss of appetite.  He does drink plenty of water.  No alcohol.   Past GI workup: Colon 11/2018  (Dr. Orvan Falconer) - Six 5 to 8 mm polyps in the transverse colon, at the hepatic flexure, in the ascending colon and in the cecum, removed with a cold snare. Resected and retrieved. - The examination was otherwise normal on direct and retroflexion views. -Biopsies tubular adenomas.  Repeat in 3 years. Previous colonoscopy also had polyps Past Medical History:  Diagnosis Date   Atrial  fibrillation (HCC)    after COVID   BPH (benign prostatic hyperplasia)    Cataract 2016   Had Bil surgery   Chest congestion 11/29/2018   Colon polyps    Coronary atherosclerosis of native coronary artery    Vessel CABG 2005   Hyperlipidemia    Hypertension    Kidney stones    Myocardial infarction [facility]) 1997   Had stent put in 197/CABG in2005   Prostate cancer (HCC)    Shoulder pain    right  side   SOB (shortness of breath) on exertion     Past Surgical History:  Procedure Laterality Date   ARTERIAL DUPLEX  04/19/2004   Carotid-no evidence of significant plaque noted, no evidence of significant ICA stenosis, artery flow is antegrade. Upper extremities-within normal limits. Lower extremities-no evidence of lower extremity arterial occlusive disease at rest   BLADDER SURGERY     had a growth in bladder/benign   CARDIAC CATHETERIZATION  04/19/2004   severe four vessel coronary disease, continue vigorous medical therapy, recommendations for multivessel bypass grafting   CARDIOVASCULAR STRESS TEST  07/01/2009   normal pattern of perfusion in all regions, no scintigraphic evidence of inducible myocardial ischemia,EKG negative for ischemia, no ECG changes,   CATARACT EXTRACTION, BILATERAL     CORONARY ARTERY BYPASS GRAFT  04/20/2004   x4. LIMA to LAD, left radial artery to RCA, SVG to ramus intermedius, SVG to circ.   DOPPLER ECHOCARDIOGRAPHY  11/11/2012   EF 55-60%, wall motion normal, no regional wall motion abnormalities, thickness of ventricular septum was mildly increased, mild regurg of the mitral valve    Family  History  Problem Relation Age of Onset   Heart failure Mother    Arthritis Mother    Heart disease Sister    Heart attack Maternal Grandfather    Heart attack Maternal Uncle    Colon cancer Maternal Uncle    Prostate cancer Maternal Uncle    Heart attack Maternal Uncle    Heart attack Maternal Uncle     Social History   Tobacco Use   Smoking status: Former     Current packs/day: 0.00    Types: Cigarettes    Quit date: 04/17/1979    Years since quitting: 44.3   Smokeless tobacco: Never  Vaping Use   Vaping status: Never Used  Substance Use Topics   Alcohol use: Yes    Alcohol/week: 12.0 standard drinks of alcohol    Types: 12 Cans of beer per week    Comment: 2 beers per day   Drug use: No    Current Outpatient Medications  Medication Sig Dispense Refill   aspirin EC 81 MG tablet Take 81 mg by mouth daily.      Calcium Carbonate-Vit D-Min (CALCIUM 1200 PO) Take by mouth. Per patient taking 400 mg daily     diltiazem (CARDIZEM CD) 120 MG 24 hr capsule TAKE 1 CAPSULE(120 MG) BY MOUTH DAILY 90 capsule 3   ezetimibe (ZETIA) 10 MG tablet TAKE 1 TABLET(10 MG) BY MOUTH DAILY 90 tablet 2   hydroxyurea (HYDREA) 500 MG capsule TAKE 1 CAPSULE BY MOUTH EVERY DAY 90 capsule 2   metoprolol tartrate (LOPRESSOR) 100 MG tablet Take 100 mg by mouth daily. Per patient taking 1/2 tablet (50 mg) of 100 mg daily     rosuvastatin (CRESTOR) 40 MG tablet Take 1 tablet (40 mg total) by mouth daily. Please advise pt to make appt with provider for further refills 90 tablet 1   SSD 1 % cream Apply topically as needed.     triamcinolone (KENALOG) 0.1 % Apply topically as needed.     No current facility-administered medications for this visit.    Allergies  Allergen Reactions   Zocor [Simvastatin]     Made fingers numb    Review of Systems:  Constitutional: Denies fever, chills, diaphoresis, appetite change and fatigue.  HEENT: Denies photophobia, eye pain, redness, hearing loss, ear pain, congestion, sore throat, rhinorrhea, sneezing, mouth sores, neck pain, neck stiffness and tinnitus.   Respiratory: Denies SOB, DOE, cough, chest tightness,  and wheezing.   Cardiovascular: Denies chest pain, palpitations and leg swelling.  Genitourinary: Denies dysuria, urgency, frequency, hematuria, flank pain and difficulty urinating.  Musculoskeletal: Denies myalgias, back  pain, joint swelling, arthralgias and gait problem.  Skin: No rash.  Neurological: Denies dizziness, seizures, syncope, weakness, light-headedness, numbness and headaches.  Hematological: Denies adenopathy. Easy bruising, personal or family bleeding history  Psychiatric/Behavioral: No anxiety or depression     Physical Exam:    BP 108/62 (BP Location: Left Arm, Patient Position: Sitting, Cuff Size: Large)   Pulse 60   Ht 5' 10.5" (1.791 m) Comment: height measured without shoes  Wt 268 lb 8 oz (121.8 kg)   BMI 37.98 kg/m  Wt Readings from Last 3 Encounters:  08/06/23 268 lb 8 oz (121.8 kg)  07/10/23 268 lb 1.9 oz (121.6 kg)  07/03/23 270 lb 6.4 oz (122.7 kg)   Constitutional:  Well-developed, in no acute distress. Psychiatric: Normal mood and affect. Behavior is normal. HEENT: Pupils normal.  Conjunctivae are normal. No scleral icterus. Neck supple.  Cardiovascular: Normal  rate, regular rhythm. No edema Pulmonary/chest: Effort normal and breath sounds normal. No wheezing, rales or rhonchi. Abdominal: Soft, nondistended. Nontender. Bowel sounds active throughout. There are no masses palpable. No hepatomegaly. Rectal: Deferred.  To be performed at the time of colonoscopy. Neurological: Alert and oriented to person place and time. Skin: Skin is warm and dry. No rashes noted.  Data Reviewed: I have personally reviewed following labs and imaging studies  CBC:    Latest Ref Rng & Units 07/03/2023   12:00 AM 02/27/2023   12:00 AM 06/13/2022   12:00 AM  CBC  WBC  11.1     8.7     9.2      Hemoglobin 13.5 - 17.5 16.6     17.1     16.1      Hematocrit 41 - 53 51     52     48      Platelets 150 - 400 K/uL 283     284     246         This result is from an external source.    CMP:    Latest Ref Rng & Units 02/06/2023    8:11 AM 05/30/2021    9:29 AM 01/05/2021   11:40 AM  CMP  Glucose 70 - 99 mg/dL 90   93   BUN 8 - 27 mg/dL 13   12   Creatinine 1.61 - 1.27 mg/dL 0.96   0.45    Sodium 409 - 144 mmol/L 141   141   Potassium 3.5 - 5.2 mmol/L 4.7   4.4   Chloride 96 - 106 mmol/L 102   101   CO2 20 - 29 mmol/L 24   23   Calcium 8.6 - 10.2 mg/dL 9.1   9.6   Total Protein 6.0 - 8.5 g/dL 6.1  6.1  6.8   Total Bilirubin 0.0 - 1.2 mg/dL 0.8  0.7  0.8   Alkaline Phos 44 - 121 IU/L 85  93  84   AST 0 - 40 IU/L 26  19  25    ALT 0 - 44 IU/L 22  16  19          Edman Circle, MD 08/06/2023, 9:10 AM  Cc: Buckner Malta, MD

## 2023-08-06 NOTE — Patient Instructions (Addendum)
_______________________________________________________  If your blood pressure at your visit was 140/90 or greater, please contact your primary care physician to follow up on this.  _______________________________________________________  If you are age 77 or older, your body mass index should be between 23-30. Your Body mass index is 37.98 kg/m. If this is out of the aforementioned range listed, please consider follow up with your Primary Care Provider.  If you are age 44 or younger, your body mass index should be between 19-25. Your Body mass index is 37.98 kg/m. If this is out of the aformentioned range listed, please consider follow up with your Primary Care Provider.   ________________________________________________________  The [facility] providers would like to encourage you to use [facility] to communicate with providers for non-urgent requests or questions.  Due to long hold times on the telephone, sending your provider a message by [facility] may be a faster and more efficient way to get a response.  Please allow 48 business hours for a response.  Please remember that this is for non-urgent requests.  _______________________________________________________   Previsit appointment for 10-15-2023 at 9am and this is a telephone call and they will call [phone]. You will have a 2 day prep  Colonoscopy set for 11-14-2023 arrival time at 9am for 10am procedure  We have given you samples of the following medication to take: Linzess 290mg  3 boxes exp 08-2023 lot [id number] 2 boxes exp 12-2023 lot [id number]  Please purchase the following medications over the counter and take as directed: Miralax 17g daily  Thank you,  Dr. Lynann Bologna

## 2023-08-07 ENCOUNTER — Inpatient Hospital Stay: Payer: Medicare Other | Attending: Oncology

## 2023-08-07 VITALS — BP 130/71 | HR 61 | Temp 98.0°F | Resp 18 | Ht 70.5 in | Wt 272.1 lb

## 2023-08-07 DIAGNOSIS — D45 Polycythemia vera: Secondary | ICD-10-CM | POA: Diagnosis not present

## 2023-08-07 NOTE — Patient Instructions (Signed)

## 2023-08-07 NOTE — Progress Notes (Signed)
Leonard Washington presents today for phlebotomy per MD orders. Phlebotomy procedure started at 0941 and ended at 62. 480 grams removed. Patient observed for 5 minutes after procedure without any incident. Patient tolerated procedure well. IV needle removed intact.

## 2023-09-10 ENCOUNTER — Inpatient Hospital Stay: Payer: Medicare Other | Attending: Oncology

## 2023-09-10 VITALS — BP 118/84 | HR 81 | Temp 98.1°F | Resp 18 | Ht 70.5 in | Wt 271.2 lb

## 2023-09-10 DIAGNOSIS — D45 Polycythemia vera: Secondary | ICD-10-CM

## 2023-09-10 NOTE — Patient Instructions (Signed)

## 2023-09-26 DIAGNOSIS — C61 Malignant neoplasm of prostate: Secondary | ICD-10-CM | POA: Diagnosis not present

## 2023-10-08 ENCOUNTER — Encounter: Payer: Self-pay | Admitting: Oncology

## 2023-10-10 ENCOUNTER — Inpatient Hospital Stay: Payer: Medicare Other | Attending: Oncology

## 2023-10-10 VITALS — BP 126/59 | HR 71 | Temp 98.4°F | Resp 18

## 2023-10-10 DIAGNOSIS — Z79899 Other long term (current) drug therapy: Secondary | ICD-10-CM | POA: Diagnosis not present

## 2023-10-10 DIAGNOSIS — D45 Polycythemia vera: Secondary | ICD-10-CM | POA: Insufficient documentation

## 2023-10-10 DIAGNOSIS — K080 Exfoliation of teeth due to systemic causes: Secondary | ICD-10-CM | POA: Diagnosis not present

## 2023-10-10 NOTE — Patient Instructions (Signed)

## 2023-10-10 NOTE — Progress Notes (Signed)
Leonard Washington presents today for phlebotomy per MD orders. Phlebotomy procedure started at 0900 and ended at 0907. 500 grams removed. Patient declined to be observed for 30 minutes after procedure without any incident. Patient tolerated procedure well. IV needle removed intact.

## 2023-10-15 ENCOUNTER — Ambulatory Visit: Payer: Medicare Other | Admitting: *Deleted

## 2023-10-15 ENCOUNTER — Telehealth: Payer: Self-pay | Admitting: *Deleted

## 2023-10-15 VITALS — Ht 70.5 in | Wt 271.0 lb

## 2023-10-15 DIAGNOSIS — Z860100 Personal history of colon polyps, unspecified: Secondary | ICD-10-CM

## 2023-10-15 NOTE — Progress Notes (Signed)
Pt's name and DOB verified at the beginning of the pre-visit wit 2 identifiers  Pt denies any difficulty with ambulating,sitting, laying down or rolling side to side  Pt has no issues with ambulation   Pt has no issues moving head neck or swallowing  No egg or soy allergy known to patient   No issues known to pt with past sedation with any surgeries or procedures  Pt denies having issues being intubated  No FH of Malignant Hyperthermia  Pt is not on diet pills or shot  Pt is not on home 02   Pt is not on blood thinners   Pt has frequent issues with constipation RN instructed pt to use Miralax per bottles instructions a week before prep days. Pt states they will  Pt is not on dialysis  Pt denise any abnormal heart rhythms   Pt denies any upcoming cardiac testing  Pt encouraged to use to use Singlecare or Goodrx to reduce cost   Patient's chart reviewed by Leonard Washington CNRA prior to pre-visit and patient appropriate for the [facility].  Pre-visit completed and red dot placed by patient's name on their procedure day (on provider's schedule).  .  Visit by phone   Pt states weight is 271 lb   Instructed pt why it is important to and  to call if they have any changes in health or new medications. Directed them to the # given and on instructions.     Instructions reviewed. Pt given both [facility] main # and MD on call # prior to instructions.  Pt states understanding. Instructed to review again prior to procedure. Pt states they will.   Instructions sent by mail with coupon and by My Chart  Instructions and coupon given to pt   Instructions sent through My Chart  Coupon sent via text to mobile phone and pt verified they received it

## 2023-10-15 NOTE — Telephone Encounter (Signed)
Attempt to reach pt for pre-visit. LM with call back #.  Will attempt to reach again in 5 min due to no other # listed in profile  Second attempt to reach pt for pre-vist unsuccessful. LM with facility # for pt to call back. Instructed pt to call # given by end of the day and reschedule the pre-visit  with RN or the scheduled procedure will be canceled.

## 2023-10-19 DIAGNOSIS — I480 Paroxysmal atrial fibrillation: Secondary | ICD-10-CM | POA: Diagnosis not present

## 2023-10-19 DIAGNOSIS — C61 Malignant neoplasm of prostate: Secondary | ICD-10-CM | POA: Diagnosis not present

## 2023-10-19 DIAGNOSIS — D45 Polycythemia vera: Secondary | ICD-10-CM | POA: Diagnosis not present

## 2023-10-19 DIAGNOSIS — D692 Other nonthrombocytopenic purpura: Secondary | ICD-10-CM | POA: Diagnosis not present

## 2023-10-30 DIAGNOSIS — M19019 Primary osteoarthritis, unspecified shoulder: Secondary | ICD-10-CM | POA: Diagnosis not present

## 2023-10-30 DIAGNOSIS — G8929 Other chronic pain: Secondary | ICD-10-CM | POA: Diagnosis not present

## 2023-10-30 DIAGNOSIS — M67912 Unspecified disorder of synovium and tendon, left shoulder: Secondary | ICD-10-CM | POA: Diagnosis not present

## 2023-10-30 DIAGNOSIS — M25512 Pain in left shoulder: Secondary | ICD-10-CM | POA: Diagnosis not present

## 2023-11-02 ENCOUNTER — Other Ambulatory Visit: Payer: Medicare Other

## 2023-11-02 ENCOUNTER — Ambulatory Visit: Payer: Medicare Other | Admitting: Oncology

## 2023-11-04 NOTE — Progress Notes (Deleted)
 [facility]  [address] [place],  [place]  [address] [phone]  Clinic Day:  07/03/2023  Referring physician: Clemmie Nest, MD  HISTORY OF PRESENT ILLNESS:  The patient is a 78 y.o. 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  He receives occasional phlebotomies at outside facilities to keep his hematocrit under 45.  He is also on hydroxyurea  500 mg daily to keep his white cells and platelets from becoming more elevated over time.  He comes in today to reassess his peripheral counts after receiving monthly phlebotomies every 3/4 months.   Since his last visit, the patient has been doing well. With respect to his polycythemia rubra vera, he denies having any hyperviscosity-related symptoms from this underlying condition.  PHYSICAL EXAM:  There were no vitals taken for this visit. Wt Readings from Last 3 Encounters:  10/15/23 271 lb (122.9 kg)  09/10/23 271 lb 4 oz (123 kg)  08/07/23 272 lb 1.9 oz (123.4 kg)   There is no height or weight on file to calculate BMI. Performance status (ECOG): 0 Physical Exam Constitutional:      Appearance: Normal appearance. He is not ill-appearing.  HENT:     Mouth/Throat:     Mouth: Mucous membranes are moist.     Pharynx: Oropharynx is clear. No oropharyngeal exudate or posterior oropharyngeal erythema.  Cardiovascular:     Rate and Rhythm: Normal rate and regular rhythm.     Heart sounds: No murmur heard.    No friction rub. No gallop.  Pulmonary:     Effort: Pulmonary effort is normal. No respiratory distress.     Breath sounds: Normal breath sounds. No wheezing, rhonchi or rales.  Abdominal:     General: Bowel sounds are normal. There is no distension.     Palpations: Abdomen is soft. There is no mass.     Tenderness: There is no abdominal tenderness.  Musculoskeletal:        General: No swelling.     Right lower leg: No edema.     Left lower leg: No edema.  Lymphadenopathy:     Cervical: No cervical  adenopathy.     Upper Body:     Right upper body: No supraclavicular or axillary adenopathy.     Left upper body: No supraclavicular or axillary adenopathy.     Lower Body: No right inguinal adenopathy. No left inguinal adenopathy.  Skin:    General: Skin is warm.     Coloration: Skin is not jaundiced.     Findings: No lesion or rash.  Neurological:     General: No focal deficit present.     Mental Status: He is alert and oriented to person, place, and time. Mental status is at baseline.  Psychiatric:        Mood and Affect: Mood normal.        Behavior: Behavior normal.        Thought Content: Thought content normal.    LABS:    ASSESSMENT & PLAN:  Assessment/Plan:  A 78 y.o. 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  As his hematocrit is well above 45, he needs to continue to be phlebotomized. He will be phlebotomized monthly for the next 4 months to get his hematocrit less than 45.  He knows to continue taking hydroxyurea  500 mg daily, which has been effective in keeping his white cells and platelets under ideal control.  He also knows to continue taking a baby aspirin  on a daily basis to offset any  potential clotting complications his polycythemia rubra vera can cause.  I will see him back in 4 months for repeat clinical assessment.  The patient understands all the plans discussed today and is in agreement with them.    Maryln Eastham DELENA Kerns, MD

## 2023-11-05 ENCOUNTER — Telehealth: Payer: Self-pay | Admitting: Oncology

## 2023-11-05 ENCOUNTER — Other Ambulatory Visit: Payer: Self-pay | Admitting: Oncology

## 2023-11-05 ENCOUNTER — Inpatient Hospital Stay: Payer: Medicare Other

## 2023-11-05 ENCOUNTER — Inpatient Hospital Stay: Payer: Medicare Other | Attending: Oncology

## 2023-11-05 ENCOUNTER — Inpatient Hospital Stay: Payer: Medicare Other | Admitting: Oncology

## 2023-11-05 ENCOUNTER — Encounter: Payer: Self-pay | Admitting: Oncology

## 2023-11-05 VITALS — BP 131/72 | HR 66 | Temp 98.2°F | Resp 18

## 2023-11-05 VITALS — BP 134/72 | HR 58 | Temp 98.2°F | Resp 18 | Ht 70.5 in | Wt 271.1 lb

## 2023-11-05 DIAGNOSIS — Z7964 Long term (current) use of myelosuppressive agent: Secondary | ICD-10-CM | POA: Insufficient documentation

## 2023-11-05 DIAGNOSIS — D45 Polycythemia vera: Secondary | ICD-10-CM | POA: Insufficient documentation

## 2023-11-05 DIAGNOSIS — Z79899 Other long term (current) drug therapy: Secondary | ICD-10-CM | POA: Insufficient documentation

## 2023-11-05 LAB — CBC WITH DIFFERENTIAL (CANCER CENTER ONLY)
Abs Immature Granulocytes: 0.11 10*3/uL — ABNORMAL HIGH (ref 0.00–0.07)
Basophils Absolute: 0.2 10*3/uL — ABNORMAL HIGH (ref 0.0–0.1)
Basophils Relative: 1 %
Eosinophils Absolute: 0.4 10*3/uL (ref 0.0–0.5)
Eosinophils Relative: 3 %
HCT: 50.1 % (ref 39.0–52.0)
Hemoglobin: 15.5 g/dL (ref 13.0–17.0)
Immature Granulocytes: 1 %
Lymphocytes Relative: 9 %
Lymphs Abs: 1.3 10*3/uL (ref 0.7–4.0)
MCH: 25.7 pg — ABNORMAL LOW (ref 26.0–34.0)
MCHC: 30.9 g/dL (ref 30.0–36.0)
MCV: 83.1 fL (ref 80.0–100.0)
Monocytes Absolute: 0.7 10*3/uL (ref 0.1–1.0)
Monocytes Relative: 5 %
Neutro Abs: 11.3 10*3/uL — ABNORMAL HIGH (ref 1.7–7.7)
Neutrophils Relative %: 81 %
Platelet Count: 314 10*3/uL (ref 150–400)
RBC: 6.03 MIL/uL — ABNORMAL HIGH (ref 4.22–5.81)
RDW: 17.1 % — ABNORMAL HIGH (ref 11.5–15.5)
WBC Count: 14 10*3/uL — ABNORMAL HIGH (ref 4.0–10.5)
nRBC: 0 % (ref 0.0–0.2)
nRBC: 0 /100{WBCs}

## 2023-11-05 NOTE — Telephone Encounter (Signed)
 Patient has been scheduled for follow-up visit per 11/05/23 LOS.  Pt given an appt calendar with date and time.

## 2023-11-05 NOTE — Progress Notes (Signed)
 [facility]  [address] [place],  [place]  [address] [phone]  Clinic Day:  11/05/2023  Referring physician: Clemmie Nest, MD   HISTORY OF PRESENT ILLNESS:  The patient is a 78 y.o. 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  He receives occasional phlebotomies at outside facilities to keep his hematocrit under 45.  He is also on hydroxyurea  500 mg daily to keep his white cells and platelets from becoming more elevated over time.  He comes in today to reassess his peripheral counts after receiving monthly phlebotomies every 3/4 months.   Since his last visit, the patient has been doing well. With respect to his polycythemia rubra vera, he denies having any hyperviscosity-related symptoms from this underlying condition.    PHYSICAL EXAM:  Blood pressure 134/72, pulse (!) 58, temperature 98.2 F (36.8 C), temperature source Oral, resp. rate 18, height 5' 10.5 (1.791 m), weight 271 lb 1.6 oz (123 kg), SpO2 94%. Wt Readings from Last 3 Encounters:  11/05/23 271 lb 1.6 oz (123 kg)  10/15/23 271 lb (122.9 kg)  09/10/23 271 lb 4 oz (123 kg)   Body mass index is 38.35 kg/m. Performance status (ECOG): 1 - Symptomatic but completely ambulatory Physical Exam Constitutional:      Appearance: Normal appearance. He is not ill-appearing.  HENT:     Mouth/Throat:     Mouth: Mucous membranes are moist.     Pharynx: Oropharynx is clear. No oropharyngeal exudate or posterior oropharyngeal erythema.  Cardiovascular:     Rate and Rhythm: Normal rate and regular rhythm.     Heart sounds: No murmur heard.    No friction rub. No gallop.  Pulmonary:     Effort: Pulmonary effort is normal. No respiratory distress.     Breath sounds: Normal breath sounds. No wheezing, rhonchi or rales.  Abdominal:     General: Bowel sounds are normal. There is no distension.     Palpations: Abdomen is soft. There is no mass.     Tenderness: There is no abdominal tenderness.   Musculoskeletal:        General: No swelling.     Right lower leg: No edema.     Left lower leg: No edema.  Lymphadenopathy:     Cervical: No cervical adenopathy.     Upper Body:     Right upper body: No supraclavicular or axillary adenopathy.     Left upper body: No supraclavicular or axillary adenopathy.     Lower Body: No right inguinal adenopathy. No left inguinal adenopathy.  Skin:    General: Skin is warm.     Coloration: Skin is not jaundiced.     Findings: No lesion or rash.  Neurological:     General: No focal deficit present.     Mental Status: He is alert and oriented to person, place, and time. Mental status is at baseline.  Psychiatric:        Mood and Affect: Mood normal.        Behavior: Behavior normal.        Thought Content: Thought content normal.    LABS:      Latest Ref Rng & Units 11/05/2023    9:46 AM 07/03/2023   12:00 AM 02/27/2023   12:00 AM  CBC  WBC 4.0 - 10.5 K/uL 14.0  11.1     8.7      Hemoglobin 13.0 - 17.0 g/dL 84.4  83.3     82.8  Hematocrit 39.0 - 52.0 % 50.1  51     52      Platelets 150 - 400 K/uL 314  283     284         This result is from an external source.      Latest Ref Rng & Units 02/06/2023    8:11 AM 05/30/2021    9:29 AM 01/05/2021   11:40 AM  CMP  Glucose 70 - 99 mg/dL 90   93   BUN 8 - 27 mg/dL 13   12   Creatinine 9.23 - 1.27 mg/dL 9.00   9.19   Sodium 865 - 144 mmol/L 141   141   Potassium 3.5 - 5.2 mmol/L 4.7   4.4   Chloride 96 - 106 mmol/L 102   101   CO2 20 - 29 mmol/L 24   23   Calcium  8.6 - 10.2 mg/dL 9.1   9.6   Total Protein 6.0 - 8.5 g/dL 6.1  6.1  6.8   Total Bilirubin 0.0 - 1.2 mg/dL 0.8  0.7  0.8   Alkaline Phos 44 - 121 IU/L 85  93  84   AST 0 - 40 IU/L 26  19  25    ALT 0 - 44 IU/L 22  16  19     ASSESSMENT & PLAN:   Assessment/Plan:  A 78 y.o. 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  As his hematocrit is well above 45, he needs to continue to be phlebotomized. He will be phlebotomized monthly for  the next 4 months to get his hematocrit less than 45.  He knows to continue taking hydroxyurea  500 mg daily, which has been effective in keeping his white cells and platelets under ideal control.  He also knows to continue taking a baby aspirin  on a daily basis to offset any potential clotting complications his polycythemia rubra vera can cause.  I will see him back in 4 months for repeat clinical assessment.  If his hematocrit continues to be high despite monthly phlebotomies or if his white count rises to/above 20K, I would consider doubling his hydroxyurea  to 500 mg BID .The patient understands all the plans discussed today and is in agreement with them.      Aloha Bartok DELENA Kerns, MD

## 2023-11-05 NOTE — Patient Instructions (Signed)

## 2023-11-14 ENCOUNTER — Ambulatory Visit: Payer: Medicare Other | Admitting: Gastroenterology

## 2023-11-14 ENCOUNTER — Encounter: Payer: Self-pay | Admitting: Gastroenterology

## 2023-11-14 VITALS — BP 106/67 | HR 57 | Temp 97.1°F | Resp 17 | Ht 70.5 in | Wt 271.0 lb

## 2023-11-14 DIAGNOSIS — Z860100 Personal history of colon polyps, unspecified: Secondary | ICD-10-CM

## 2023-11-14 DIAGNOSIS — D123 Benign neoplasm of transverse colon: Secondary | ICD-10-CM

## 2023-11-14 DIAGNOSIS — D120 Benign neoplasm of cecum: Secondary | ICD-10-CM | POA: Diagnosis not present

## 2023-11-14 DIAGNOSIS — D122 Benign neoplasm of ascending colon: Secondary | ICD-10-CM

## 2023-11-14 DIAGNOSIS — K640 First degree hemorrhoids: Secondary | ICD-10-CM

## 2023-11-14 DIAGNOSIS — K5730 Diverticulosis of large intestine without perforation or abscess without bleeding: Secondary | ICD-10-CM | POA: Diagnosis not present

## 2023-11-14 DIAGNOSIS — Z1211 Encounter for screening for malignant neoplasm of colon: Secondary | ICD-10-CM

## 2023-11-14 DIAGNOSIS — K627 Radiation proctitis: Secondary | ICD-10-CM | POA: Diagnosis not present

## 2023-11-14 MED ORDER — SODIUM CHLORIDE 0.9 % IV SOLN: 500.0000 mL | Freq: Once | INTRAVENOUS | Status: DC

## 2023-11-14 NOTE — Op Note (Signed)
  [facility] Patient Name: Leonard Washington Procedure Date: 11/14/2023 9:37 AM MRN: [id number] Endoscopist: Lajuan Pila , MD, [id number] Age: 78 Referring MD:  Date of Birth: 06-23-1946 Gender: Male Account #: [id number] Procedure:                Colonoscopy Indications:              High risk colon cancer surveillance: Personal                            history of colonic polyps Medicines:                Monitored Anesthesia Care Procedure:                Pre-Anesthesia Assessment:                           - Prior to the procedure, a History and Physical                            was performed, and patient medications and                            allergies were reviewed. The patient's tolerance of                            previous anesthesia was also reviewed. The risks                            and benefits of the procedure and the sedation                            options and risks were discussed with the patient.                            All questions were answered, and informed consent                            was obtained. Prior Anticoagulants: The patient has                            taken no anticoagulant or antiplatelet agents. ASA                            Grade Assessment: II - A patient with mild systemic                            disease. After reviewing the risks and benefits,                            the patient was deemed in satisfactory condition to                            undergo the procedure.  After obtaining informed consent, the colonoscope                            was passed under direct vision. Throughout the                            procedure, the patient's blood pressure, pulse, and                            oxygen saturations were monitored continuously. The                            CF [id number] #[id number] was introduced through the anus                            and advanced to the the cecum,  identified by                            appendiceal orifice and ileocecal valve. The                            colonoscopy was performed without difficulty. The                            patient tolerated the procedure well. The quality                            of the bowel preparation was adequate to identify                            polyps. There was some retained stool and some                            areas of the colon making it somewhat harder to                            visualize. Nevertheless, aggressive suctioning and                            aspiration was performed. Overall, 90 to 95% of the                            colonic mucosa was visualized satisfactorily. The                            ileocecal valve, appendiceal orifice, and rectum                            were photographed. Scope In: 9:48:43 AM Scope Out: 10:09:49 AM Scope Withdrawal Time: 0 hours 15 minutes 29 seconds  Total Procedure Duration: 0 hours 21 minutes 6 seconds  Findings:                 Five sessile polyps were found in the proximal  transverse colon, proximal ascending colon, mid                            ascending colon (2) and cecum. The polyps were 4 to                            6 mm in size. These polyps were removed with a cold                            snare. Resection and retrieval were complete.                           A few medium-mouthed diverticula were found in the                            sigmoid colon.                           A few small angioectasias without bleeding were                            found in the distal rectum.                           Non-bleeding internal hemorrhoids were found during                            retroflexion. The hemorrhoids were small and Grade                            I (internal hemorrhoids that do not prolapse).                            Healed posterior anal fissure was also noted.                            The exam was otherwise without abnormality on                            direct and retroflexion views. Complications:            No immediate complications. Estimated Blood Loss:     Estimated blood loss: none. Impression:               - Five 4 to 6 mm polyps in the proximal transverse                            colon, in the proximal ascending colon, in the mid                            ascending colon and in the cecum, removed with a                            cold snare. Resected and retrieved.                           -  Mild sigmoid diverticulosis.                           - Mild radiation proctitis.                           - Non-bleeding internal hemorrhoids.                           - The examination was otherwise normal on direct                            and retroflexion views. Recommendation:           - Patient has a contact number available for                            emergencies. The signs and symptoms of potential                            delayed complications were discussed with the                            patient. Return to normal activities tomorrow.                            Written discharge instructions were provided to the                            patient.                           - High fiber diet.                           - Continue present medications.                           - Await pathology results.                           - Repeat colonoscopy for surveillance based on                            pathology results.                           - The findings and recommendations were discussed                            with the patient's family. Lajuan Pila, MD 11/14/2023 10:17:40 AM This report has been signed electronically.

## 2023-11-14 NOTE — Progress Notes (Signed)
 Report to PACU, RN, vss, BBS= Clear.

## 2023-11-14 NOTE — Progress Notes (Signed)
 [facility] History and Physical   Primary Care Physician:  Harvest Lineman, MD   Reason for Procedure:     History of polyps  Plan:     colonoscopy     HPI: Leonard Washington is a 78 y.o. male    Past Medical History:  Diagnosis Date   Atrial fibrillation (HCC)    after COVID   BPH (benign prostatic hyperplasia)    Cataract 2016   Had Bil surgery   Chest congestion 11/29/2018   Colon polyps    Coronary atherosclerosis of native coronary artery    Vessel CABG 2005   Hyperlipidemia    Hypertension    Kidney stones    Myocardial infarction [facility]) 1997   Had stent put in 197/CABG in2005   Prostate cancer (HCC)    Shoulder pain    right  side   SOB (shortness of breath) on exertion     Past Surgical History:  Procedure Laterality Date   ARTERIAL DUPLEX  04/19/2004   Carotid-no evidence of significant plaque noted, no evidence of significant ICA stenosis, artery flow is antegrade. Upper extremities-within normal limits. Lower extremities-no evidence of lower extremity arterial occlusive disease at rest   BLADDER SURGERY     had a growth in bladder/benign   CARDIAC CATHETERIZATION  04/19/2004   severe four vessel coronary disease, continue vigorous medical therapy, recommendations for multivessel bypass grafting   CARDIOVASCULAR STRESS TEST  07/01/2009   normal pattern of perfusion in all regions, no scintigraphic evidence of inducible myocardial ischemia,EKG negative for ischemia, no ECG changes,   CATARACT EXTRACTION, BILATERAL     CORONARY ARTERY BYPASS GRAFT  04/20/2004   x4. LIMA to LAD, left radial artery to RCA, SVG to ramus intermedius, SVG to circ.   DOPPLER ECHOCARDIOGRAPHY  11/11/2012   EF 55-60%, wall motion normal, no regional wall motion abnormalities, thickness of ventricular septum was mildly increased, mild regurg of the mitral valve    Prior to Admission medications   Medication Sig Start Date End Date Taking? Authorizing Provider   aspirin  EC 81 MG tablet Take 81 mg by mouth daily.    Yes [provider]  Calcium  Carbonate-Vit D-Min (CALCIUM  1200 PO) Take by mouth. Per patient taking 400 mg daily   Yes [provider]  diltiazem  (CARDIZEM  CD) 120 MG 24 hr capsule TAKE 1 CAPSULE(120 MG) BY MOUTH DAILY 03/19/23  Yes Arnoldo Lapping, MD  ezetimibe  (ZETIA ) 10 MG tablet TAKE 1 TABLET(10 MG) BY MOUTH DAILY 04/25/23  Yes Arnoldo Lapping, MD  hydroxyurea  (HYDREA ) 500 MG capsule TAKE 1 CAPSULE BY MOUTH EVERY DAY 11/21/22  Yes Mosher, Kelli A, PA-C  metoprolol  tartrate (LOPRESSOR ) 100 MG tablet Take 100 mg by mouth daily. Per patient taking 1/2 tablet (50 mg) of 100 mg daily   Yes [provider]  rosuvastatin  (CRESTOR ) 40 MG tablet Take 1 tablet (40 mg total) by mouth daily. Please advise pt to make appt with provider for further refills 08/29/21  Yes Arnoldo Lapping, MD  SSD 1 % cream Apply topically as needed. 08/13/20   [provider]    Current Outpatient Medications  Medication Sig Dispense Refill   aspirin  EC 81 MG tablet Take 81 mg by mouth daily.      Calcium  Carbonate-Vit D-Min (CALCIUM  1200 PO) Take by mouth. Per patient taking 400 mg daily     diltiazem  (CARDIZEM  CD) 120 MG 24 hr capsule TAKE 1 CAPSULE(120 MG) BY MOUTH DAILY 90 capsule 3  ezetimibe  (ZETIA ) 10 MG tablet TAKE 1 TABLET(10 MG) BY MOUTH DAILY 90 tablet 2   hydroxyurea  (HYDREA ) 500 MG capsule TAKE 1 CAPSULE BY MOUTH EVERY DAY 90 capsule 2   metoprolol  tartrate (LOPRESSOR ) 100 MG tablet Take 100 mg by mouth daily. Per patient taking 1/2 tablet (50 mg) of 100 mg daily     rosuvastatin  (CRESTOR ) 40 MG tablet Take 1 tablet (40 mg total) by mouth daily. Please advise pt to make appt with provider for further refills 90 tablet 1   SSD 1 % cream Apply topically as needed.     No current facility-administered medications for this visit.    Allergies as of 11/14/2023 - Review Complete 11/14/2023  Allergen Reaction Noted   Zocor  [simvastatin]  04/16/2014    Family History  Problem Relation Age of Onset   Heart failure Mother    Arthritis Mother    Heart disease Sister    Heart attack Maternal Grandfather    Heart attack Maternal Uncle    Colon cancer Maternal Uncle    Prostate cancer Maternal Uncle    Heart attack Maternal Uncle    Heart attack Maternal Uncle     Social History   Socioeconomic History   Marital status: Married    Spouse name: Not on file   Number of children: 3   Years of education: Not on file   Highest education level: Not on file  Occupational History   Occupation: Chartered certified accountant  Tobacco Use   Smoking status: Former    Current packs/day: 0.00    Types: Cigarettes    Quit date: 04/17/1979    Years since quitting: 44.6   Smokeless tobacco: Never  Vaping Use   Vaping status: Never Used  Substance and Sexual Activity   Alcohol use: Yes    Alcohol/week: 12.0 standard drinks of alcohol    Types: 12 Cans of beer per week    Comment: 2 beers per day   Drug use: No   Sexual activity: Not on file  Other Topics Concern   Not on file  Social History Narrative   The patient is married. He lives at [facility]. Retired since 2012. Remote smoker.   Social Drivers of Corporate investment banker Strain: Not on file  Food Insecurity: Not on file  Transportation Needs: Not on file  Physical Activity: Not on file  Stress: Not on file  Social Connections: Not on file  Intimate Partner Violence: Not on file    Review of Systems: Positive for none All other review of systems negative except as mentioned in the HPI.  Physical Exam: Vital signs in last 24 hours: @VSRANGES @   General:   Alert,  Well-developed, well-nourished, pleasant and cooperative in NAD Lungs:  Clear throughout to auscultation.   Heart:  Regular rate and rhythm; no murmurs, clicks, rubs,  or gallops. Abdomen:  Soft, nontender and nondistended. Normal bowel sounds.   Neuro/Psych:  Alert and cooperative. Normal  mood and affect. A and O x 3    No significant changes were identified.  The patient continues to be an appropriate candidate for the planned procedure and anesthesia.   Magnus Schuller, MD. [facility] 11/14/2023 3:47 PM@

## 2023-11-14 NOTE — Progress Notes (Signed)
 Called to room to assist during endoscopic procedure.  Patient ID and intended procedure confirmed with present staff. Received instructions for my participation in the procedure from the performing physician.

## 2023-11-14 NOTE — Patient Instructions (Signed)
   Handouts on polyps,diverticulosis,& hemorrhoids given to you today.   Await pathology results on polyps removed   HANDOUT ON HIGH FIBER DIET GIVEN TO YOU TODAY  Continue previous diet & medications  TAKE Miralax as Dr Venice Gillis discussed with you     YOU HAD AN ENDOSCOPIC PROCEDURE TODAY AT THE [facility]:   Refer to the procedure report that was given to you for any specific questions about what was found during the examination.  If the procedure report does not answer your questions, please call your gastroenterologist to clarify.  If you requested that your care partner not be given the details of your procedure findings, then the procedure report has been included in a sealed envelope for you to review at your convenience later.  YOU SHOULD EXPECT: Some feelings of bloating in the abdomen. Passage of more gas than usual.  Walking can help get rid of the air that was put into your GI tract during the procedure and reduce the bloating. If you had a lower endoscopy (such as a colonoscopy or flexible sigmoidoscopy) you may notice spotting of blood in your stool or on the toilet paper. If you underwent a bowel prep for your procedure, you may not have a normal bowel movement for a few days.  Please Note:  You might notice some irritation and congestion in your nose or some drainage.  This is from the oxygen used during your procedure.  There is no need for concern and it should clear up in a day or so.  SYMPTOMS TO REPORT IMMEDIATELY:  Following lower endoscopy (colonoscopy or flexible sigmoidoscopy):  Excessive amounts of blood in the stool  Significant tenderness or worsening of abdominal pains  Swelling of the abdomen that is new, acute  Fever of 100F or higher  ed at any hour by calling [phone]. Do not use MyChart messaging for urgent concerns.    DIET:  We do recommend a small meal at first, but then you may proceed to your regular diet.  Drink plenty of fluids  but you should avoid alcoholic beverages for 24 hours.  ACTIVITY:  You should plan to take it easy for the rest of today and you should NOT DRIVE or use heavy machinery until tomorrow (because of the sedation medicines used during the test).    FOLLOW UP: Our staff will call the number listed on your records the next business day following your procedure.  We will call around 7:15- 8:00 am to check on you and address any questions or concerns that you may have regarding the information given to you following your procedure. If we do not reach you, we will leave a message.     If any biopsies were taken you will be contacted by phone or by letter within the next 1-3 weeks.  Please call us  at [phone] if you have not heard about the biopsies in 3 weeks.    SIGNATURES/CONFIDENTIALITY: You and/or your care partner have signed paperwork which will be entered into your electronic medical record.  These signatures attest to the fact that that the information above on your After Visit Summary has been reviewed and is understood.  Full responsibility of the confidentiality of this discharge information lies with you and/or your care-partner.

## 2023-11-14 NOTE — Progress Notes (Signed)
 Pt's states no medical or surgical changes since previsit or office visit.

## 2023-11-15 ENCOUNTER — Telehealth: Payer: Self-pay

## 2023-11-15 NOTE — Telephone Encounter (Signed)
LMOM

## 2023-11-16 LAB — SURGICAL PATHOLOGY

## 2023-11-25 ENCOUNTER — Encounter: Payer: Self-pay | Admitting: Gastroenterology

## 2023-11-28 ENCOUNTER — Other Ambulatory Visit: Payer: Self-pay | Admitting: Hematology and Oncology

## 2023-11-28 DIAGNOSIS — D45 Polycythemia vera: Secondary | ICD-10-CM

## 2023-11-29 ENCOUNTER — Encounter: Payer: Self-pay | Admitting: Oncology

## 2023-12-03 ENCOUNTER — Inpatient Hospital Stay: Payer: Medicare Other | Attending: Oncology

## 2023-12-03 NOTE — Progress Notes (Signed)
Leonard Washington presents today for phlebotomy per MD orders. Phlebotomy procedure started at 0930 and ended at 0940. 500 grams removed. Patient observed for 30 minutes after procedure without any incident. Patient tolerated procedure well. IV needle removed intact.

## 2023-12-03 NOTE — Patient Instructions (Addendum)

## 2023-12-06 DIAGNOSIS — M67912 Unspecified disorder of synovium and tendon, left shoulder: Secondary | ICD-10-CM | POA: Diagnosis not present

## 2023-12-06 DIAGNOSIS — M25512 Pain in left shoulder: Secondary | ICD-10-CM | POA: Diagnosis not present

## 2023-12-31 ENCOUNTER — Inpatient Hospital Stay: Payer: Medicare Other | Attending: Oncology

## 2023-12-31 DIAGNOSIS — D45 Polycythemia vera: Secondary | ICD-10-CM | POA: Insufficient documentation

## 2023-12-31 DIAGNOSIS — Z79899 Other long term (current) drug therapy: Secondary | ICD-10-CM | POA: Insufficient documentation

## 2024-01-22 DIAGNOSIS — D485 Neoplasm of uncertain behavior of skin: Secondary | ICD-10-CM | POA: Diagnosis not present

## 2024-01-22 DIAGNOSIS — L821 Other seborrheic keratosis: Secondary | ICD-10-CM | POA: Diagnosis not present

## 2024-01-22 DIAGNOSIS — L578 Other skin changes due to chronic exposure to nonionizing radiation: Secondary | ICD-10-CM | POA: Diagnosis not present

## 2024-01-22 DIAGNOSIS — L570 Actinic keratosis: Secondary | ICD-10-CM | POA: Diagnosis not present

## 2024-01-24 DIAGNOSIS — H353131 Nonexudative age-related macular degeneration, bilateral, early dry stage: Secondary | ICD-10-CM | POA: Diagnosis not present

## 2024-01-24 DIAGNOSIS — H524 Presbyopia: Secondary | ICD-10-CM | POA: Diagnosis not present

## 2024-01-24 DIAGNOSIS — H40013 Open angle with borderline findings, low risk, bilateral: Secondary | ICD-10-CM | POA: Diagnosis not present

## 2024-01-28 ENCOUNTER — Inpatient Hospital Stay: Payer: Medicare Other

## 2024-01-28 VITALS — BP 140/64 | HR 71 | Temp 98.3°F | Resp 18 | Ht 70.5 in | Wt 268.1 lb

## 2024-01-28 DIAGNOSIS — Z79899 Other long term (current) drug therapy: Secondary | ICD-10-CM | POA: Diagnosis not present

## 2024-01-28 DIAGNOSIS — D45 Polycythemia vera: Secondary | ICD-10-CM | POA: Diagnosis not present

## 2024-01-28 NOTE — Patient Instructions (Signed)

## 2024-01-28 NOTE — Progress Notes (Signed)
 Leonard Washington presents today for phlebotomy per MD orders. Phlebotomy procedure started at 0900 and ended at 0907. 500 grams removed. Patient observed for 30 minutes after procedure without any incident. Patient tolerated procedure well. IV needle removed intact.

## 2024-01-31 ENCOUNTER — Encounter: Payer: Self-pay | Admitting: Cardiovascular Disease

## 2024-01-31 ENCOUNTER — Ambulatory Visit: Payer: Medicare Other | Attending: Cardiovascular Disease | Admitting: Cardiovascular Disease

## 2024-01-31 VITALS — BP 103/69 | HR 71 | Resp 16 | Ht 70.0 in | Wt 271.4 lb

## 2024-01-31 DIAGNOSIS — E782 Mixed hyperlipidemia: Secondary | ICD-10-CM

## 2024-01-31 DIAGNOSIS — I10 Essential (primary) hypertension: Secondary | ICD-10-CM

## 2024-01-31 DIAGNOSIS — I25119 Atherosclerotic heart disease of native coronary artery with unspecified angina pectoris: Secondary | ICD-10-CM

## 2024-01-31 DIAGNOSIS — I480 Paroxysmal atrial fibrillation: Secondary | ICD-10-CM | POA: Diagnosis not present

## 2024-01-31 NOTE — Patient Instructions (Signed)
 Follow-Up: At [facility], you and your health needs are our priority.  As part of our continuing mission to provide you with exceptional heart care, our providers are all part of one team.  This team includes your primary Cardiologist (physician) and Advanced Practice Providers or APPs (Physician Assistants and Nurse Practitioners) who all work together to provide you with the care you need, when you need it.  Your next appointment:   1 year(s)  Provider:   Tonny Bollman, MD        1st Floor: - Lobby - Registration  - Pharmacy  - Lab - Cafe  2nd Floor: - PV Lab - Diagnostic Testing (echo, CT, nuclear med)  3rd Floor: - Vacant  4th Floor: - TCTS (cardiothoracic surgery) - AFib Clinic - Structural Heart Clinic - Vascular Surgery  - Vascular Ultrasound  5th Floor: - [facility] (general and EP) - Clinical Pharmacy for coumadin, hypertension, lipid, weight-loss medications, and med management appointments    Valet parking services will be available as well.

## 2024-01-31 NOTE — Assessment & Plan Note (Signed)
 No anginal symptoms on antianginal medical therapy with metoprolol and diltiazem.  He continues on aspirin and high intensity statin drug as above.

## 2024-01-31 NOTE — Progress Notes (Signed)
 Cardiology Office Note:    Date:  02/03/2024   ID:  Leonard Washington, DOB 04-01-1946, MRN [id number]  PCP:  Buckner Malta, MD   [facility] Providers Cardiologist:  Tonny Bollman, MD     Referring MD: Buckner Malta, MD   Chief Complaint  Patient presents with   Coronary Artery Disease    History of Present Illness:    Leonard Washington is a 78 y.o. male with a hx of coronary artery disease status post remote CABG, presenting for follow-up evaluation.  The patient underwent four-vessel CABG in 2005 with a LIMA to LAD, left radial to RCA, saphenous vein graft to ramus intermedius, and saphenous vein graft to left circumflex marginal.  Comorbid conditions include hypertension and mixed hyperlipidemia.  Noncardiac issues include prostate cancer and polycythemia vera.   The patient is here alone today.  He has been doing well from a cardiac perspective over the past year.  He has no specific complaints. Today, he denies symptoms of palpitations, chest pain, shortness of breath, orthopnea, PND, lower extremity edema, dizziness, or syncope.  He considers himself active and does regular walking without any exertional symptoms.  States he is not as active as he was 20 years ago but still feels like he is doing pretty well in this regard.  Current Medications: Current Meds  Medication Sig   aspirin EC 81 MG tablet Take 81 mg by mouth daily.    Calcium Carbonate-Vit D-Min (CALCIUM 1200 PO) Take by mouth. Per patient taking 400 mg daily   diltiazem (CARDIZEM CD) 120 MG 24 hr capsule TAKE 1 CAPSULE(120 MG) BY MOUTH DAILY   ezetimibe (ZETIA) 10 MG tablet TAKE 1 TABLET(10 MG) BY MOUTH DAILY   hydroxyurea (HYDREA) 500 MG capsule TAKE 1 CAPSULE BY MOUTH EVERY DAY   metoprolol tartrate (LOPRESSOR) 100 MG tablet Take 100 mg by mouth daily. Per patient taking 1/2 tablet (50 mg) of 100 mg daily   rosuvastatin (CRESTOR) 40 MG tablet Take 1 tablet (40 mg total) by mouth daily.  Please advise pt to make appt with provider for further refills   SSD 1 % cream Apply topically as needed.     Allergies:   Zocor [simvastatin]   ROS:   Please see the history of present illness.    All other systems reviewed and are negative.  EKGs/Labs/Other Studies Reviewed:    The following studies were reviewed today: Cardiac Studies & Procedures   ______________________________________________________________________________________________   STRESS TESTS  MYOCARDIAL PERFUSION IMAGING 01/29/2020  Narrative  The left ventricular ejection fraction is hyperdynamic (>65%).  Nuclear stress EF: 66%.  There was no ST segment deviation noted during stress.  The study is normal.  This is a low risk study.  No change from prior study.   ECHOCARDIOGRAM  ECHOCARDIOGRAM LIMITED 01/02/2020  Narrative ECHOCARDIOGRAM LIMITED REPORT    Patient Name:   Leonard Washington Date of Exam: 01/02/2020 Medical Rec #:  [id number]             Height:       71.0 in Accession #:    [id number]            Weight:       253.7 lb Date of Birth:  [birth year]              BSA:          2.333 m Patient Age:    5 years  BP:           121/69 mmHg Patient Gender: M                     HR:           77 bpm. Exam Location:  Inpatient  Procedure: Limited Echo, Cardiac Doppler and Limited Color Doppler  MODIFIED REPORT: This report was modified by Arvilla Meres MD on 01/09/2020 due to fix report. Indications:     R94.31 Abnormal EKG  History:         Patient has prior history of Echocardiogram examinations, most recent 07/27/2016. Previous Myocardial Infarction, Signs/Symptoms:Dyspnea; Risk Factors:Hypertension and Dyslipidemia. COVID19 Positive.  Sonographer:     Elmarie Shiley Dance Referring Phys:  [id number] JXBJYN A DAVID Diagnosing Phys: Arvilla Meres MD  IMPRESSIONS   1. Left ventricular ejection fraction, by estimation, is 60 to 65%. The left ventricle has normal function. The  left ventricle has no regional wall motion abnormalities. There is mild left ventricular hypertrophy. Tissue doppler not performed. 2. The right ventricular size is normal. 3. Left atrial size was mild to moderately dilated. 4. Right atrial size was mildly dilated. 5. The mitral valve is grossly normal. Trivial mitral valve regurgitation. 6. The aortic valve is tricuspid. Aortic valve regurgitation is not visualized. Mild aortic valve sclerosis is present, with no evidence of aortic valve stenosis. 7. Limited images due to poor acoustic windowsL  FINDINGS Left Ventricle: Left ventricular ejection fraction, by estimation, is 60 to 65%. The left ventricle has normal function. The left ventricle has no regional wall motion abnormalities. There is mild left ventricular hypertrophy.  Right Ventricle: The right ventricular size is normal. No increase in right ventricular wall thickness.  Left Atrium: Left atrial size was mild to moderately dilated.  Right Atrium: Right atrial size was mildly dilated.  Pericardium: There is no evidence of pericardial effusion.  Mitral Valve: The mitral valve is grossly normal. Trivial mitral valve regurgitation.  Tricuspid Valve: The tricuspid valve is normal in structure. Tricuspid valve regurgitation is not demonstrated.  Aortic Valve: The aortic valve is tricuspid. Aortic valve regurgitation is not visualized. Mild aortic valve sclerosis is present, with no evidence of aortic valve stenosis. There is mild calcification of the aortic valve.  Pulmonic Valve: The pulmonic valve was grossly normal. Pulmonic valve regurgitation is trivial.  Aorta: The aortic root and ascending aorta are structurally normal, with no evidence of dilitation.  Venous: The inferior vena cava was not well visualized.   LEFT VENTRICLE PLAX 2D LVIDd:         4.50 cm LVIDs:         4.10 cm LV PW:         1.20 cm LV IVS:        1.00 cm LVOT diam:     2.20 cm LV SV:         84 LV  SV Index:   36 LVOT Area:     3.80 cm   RIGHT VENTRICLE          IVC RV Basal diam:  2.70 cm  IVC diam: 1.90 cm  LEFT ATRIUM             Index       RIGHT ATRIUM           Index LA diam:        4.70 cm 2.01 cm/m  RA Area:     14.70 cm LA Vol (A2C):  60.4 ml 25.89 ml/m RA Volume:   36.50 ml  15.64 ml/m LA Vol (A4C):   52.8 ml 22.63 ml/m LA Biplane Vol: 58.3 ml 24.99 ml/m AORTIC VALVE LVOT Vmax:   123.00 cm/s LVOT Vmean:  75.000 cm/s LVOT VTI:    0.222 m  AORTA Ao Root diam: 3.90 cm Ao Asc diam:  3.20 cm  MITRAL VALVE MV Area (PHT): 3.72 cm    SHUNTS MV Decel Time: 204 msec    Systemic VTI:  0.22 m MV E velocity: 76.60 cm/s  Systemic Diam: 2.20 cm MV A velocity: 72.80 cm/s MV E/A ratio:  1.05  Arvilla Meres MD Electronically signed by Arvilla Meres MD Signature Date/Time: 01/02/2020/2:30:39 PM    Final (Updated)    MONITORS  CARDIAC EVENT MONITOR 06/10/2020  Narrative 1. The basic rhythm is normal sinus with an average HR of 69 bpm 2. No atrial fibrillation or flutter 3. No high-grade heart block or pathologic pauses 4. There are occasional PVC's and rare supraventricular beats without sustained arrhythmias. There is a single ventricular run of 8 beats.       ______________________________________________________________________________________________      EKG:   EKG Interpretation Date/Time:  Thursday January 31 2024 09:20:07 EDT Ventricular Rate:  66 PR Interval:  282 QRS Duration:  88 QT Interval:  422 QTC Calculation: 442 R Axis:   50  Text Interpretation: Sinus rhythm with 1st degree A-V block Low voltage QRS When compared with ECG of 06-Jan-2020 14:06, No significant change was found Confirmed by Tonny Bollman [phone]) on 01/31/2024 10:05:35 AM    Recent Labs: 02/06/2023: ALT 22; BUN 13; Creatinine, Ser 0.99; Potassium 4.7; Sodium 141 11/05/2023: Hemoglobin 15.5; Platelet Count 314  Recent Lipid Panel    Component Value Date/Time   CHOL  117 02/06/2023 0815   TRIG 132 02/06/2023 0815   HDL 40 02/06/2023 0815   CHOLHDL 2.9 02/06/2023 0815   CHOLHDL 3.1 07/13/2016 0832   VLDL 21 07/13/2016 0832   LDLCALC 54 02/06/2023 0815            Physical Exam:    VS:  BP 103/69 (BP Location: Left Arm, Patient Position: Sitting, Cuff Size: Large)   Pulse 71   Resp 16   Ht 5\' 10"  (1.778 m)   Wt 271 lb 6.4 oz (123.1 kg)   SpO2 96%   BMI 38.94 kg/m     Wt Readings from Last 3 Encounters:  01/31/24 271 lb 6.4 oz (123.1 kg)  01/28/24 268 lb 1.9 oz (121.6 kg)  12/31/23 267 lb (121.1 kg)     GEN:  Well nourished, well developed in no acute distress HEENT: Normal NECK: No JVD; No carotid bruits LYMPHATICS: No lymphadenopathy CARDIAC: RRR, no murmurs, rubs, gallops RESPIRATORY:  Clear to auscultation without rales, wheezing or rhonchi  ABDOMEN: Soft, non-tender, non-distended MUSCULOSKELETAL:  No edema; No deformity  SKIN: Warm and dry NEUROLOGIC:  Alert and oriented x 3 PSYCHIATRIC:  Normal affect   Assessment & Plan Essential hypertension Blood pressure well-controlled on current medical therapy.  Continue diltiazem and metoprolol at current doses. Mixed hyperlipidemia Treated with rosuvastatin and ezetimibe.  Lipids have been at goal with a cholesterol of 117, HDL 40, LDL 54.  Discussed the importance of weight loss, diet, and exercise.  Discussed portion control and specific recommendations for diet today. Paroxysmal atrial fibrillation (HCC) Isolated episode when he had COVID-19 pneumonia several years ago.  No recurrence.  No longer on anticoagulation. Coronary artery disease involving native coronary  artery of native heart with angina pectoris (HCC) No anginal symptoms on antianginal medical therapy with metoprolol and diltiazem.  He continues on aspirin and high intensity statin drug as above.            Medication Adjustments/Labs and Tests Ordered: Current medicines are reviewed at length with the  patient today.  Concerns regarding medicines are outlined above.  Orders Placed This Encounter  Procedures   EKG 12-Lead   No orders of the defined types were placed in this encounter.   Patient Instructions  Follow-Up: At [facility], you and your health needs are our priority.  As part of our continuing mission to provide you with exceptional heart care, our providers are all part of one team.  This team includes your primary Cardiologist (physician) and Advanced Practice Providers or APPs (Physician Assistants and Nurse Practitioners) who all work together to provide you with the care you need, when you need it.  Your next appointment:   1 year(s)  Provider:   Tonny Bollman, MD        1st Floor: - Lobby - Registration  - Pharmacy  - Lab - Cafe  2nd Floor: - PV Lab - Diagnostic Testing (echo, CT, nuclear med)  3rd Floor: - Vacant  4th Floor: - TCTS (cardiothoracic surgery) - AFib Clinic - Structural Heart Clinic - Vascular Surgery  - Vascular Ultrasound  5th Floor: - HeartCare Cardiology (general and EP) - Clinical Pharmacy for coumadin, hypertension, lipid, weight-loss medications, and med management appointments    Valet parking services will be available as well.     Signed, Tonny Bollman, MD  02/03/2024 8:56 AM    [facility]

## 2024-02-03 ENCOUNTER — Encounter: Payer: Self-pay | Admitting: Cardiovascular Disease

## 2024-02-24 NOTE — Progress Notes (Unsigned)
 [facility]  [address] [place],  [place]  [address] [phone]  Clinic Day:  02/25/2024  Referring physician: Harvest Lineman, MD   HISTORY OF PRESENT ILLNESS:  The patient is a 78 y.o. 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  He has been phlebotomized monthly over these past months to get his hematocrit under 45.  He is also on hydroxyurea  500 mg daily to keep his white cells and platelets from becoming more elevated over time.  He comes in today to reassess his peripheral counts.  Since his last visit, the patient has been doing well. With respect to his polycythemia rubra vera, he denies having any hyperviscosity-related symptoms from this underlying condition.    PHYSICAL EXAM:  Blood pressure (!) 139/59, pulse 63, temperature 97.8 F (36.6 C), temperature source Oral, resp. rate 16, height 5\' 10"  (1.778 m), weight 270 lb (122.5 kg), SpO2 98%. Wt Readings from Last 3 Encounters:  02/25/24 270 lb (122.5 kg)  01/31/24 271 lb 6.4 oz (123.1 kg)  01/28/24 268 lb 1.9 oz (121.6 kg)   Body mass index is 38.74 kg/m. Performance status (ECOG): 1 - Symptomatic but completely ambulatory Physical Exam Constitutional:      Appearance: Normal appearance. He is not ill-appearing.  HENT:     Mouth/Throat:     Mouth: Mucous membranes are moist.     Pharynx: Oropharynx is clear. No oropharyngeal exudate or posterior oropharyngeal erythema.  Cardiovascular:     Rate and Rhythm: Normal rate and regular rhythm.     Heart sounds: No murmur heard.    No friction rub. No gallop.  Pulmonary:     Effort: Pulmonary effort is normal. No respiratory distress.     Breath sounds: Normal breath sounds. No wheezing, rhonchi or rales.  Abdominal:     General: Bowel sounds are normal. There is no distension.     Palpations: Abdomen is soft. There is no mass.     Tenderness: There is no abdominal tenderness.  Musculoskeletal:        General: No swelling.      Right lower leg: No edema.     Left lower leg: No edema.  Lymphadenopathy:     Cervical: No cervical adenopathy.     Upper Body:     Right upper body: No supraclavicular or axillary adenopathy.     Left upper body: No supraclavicular or axillary adenopathy.     Lower Body: No right inguinal adenopathy. No left inguinal adenopathy.  Skin:    General: Skin is warm.     Coloration: Skin is not jaundiced.     Findings: No lesion or rash.  Neurological:     General: No focal deficit present.     Mental Status: He is alert and oriented to person, place, and time. Mental status is at baseline.  Psychiatric:        Mood and Affect: Mood normal.        Behavior: Behavior normal.        Thought Content: Thought content normal.    LABS:      Latest Ref Rng & Units 02/25/2024    9:18 AM 11/05/2023    9:46 AM 07/03/2023   12:00 AM  CBC  WBC 4.0 - 10.5 K/uL 12.2  14.0  11.1      Hemoglobin 13.0 - 17.0 g/dL 91.4  78.2  95.6      Hematocrit 39.0 - 52.0 % 46.8  50.1  51  Platelets 150 - 400 K/uL 324  314  283         This result is from an external source.      Latest Ref Rng & Units 02/06/2023    8:11 AM 05/30/2021    9:29 AM 01/05/2021   11:40 AM  CMP  Glucose 70 - 99 mg/dL 90   93   BUN 8 - 27 mg/dL 13   12   Creatinine 5.63 - 1.27 mg/dL 8.75   6.43   Sodium 329 - 144 mmol/L 141   141   Potassium 3.5 - 5.2 mmol/L 4.7   4.4   Chloride 96 - 106 mmol/L 102   101   CO2 20 - 29 mmol/L 24   23   Calcium  8.6 - 10.2 mg/dL 9.1   9.6   Total Protein 6.0 - 8.5 g/dL 6.1  6.1  6.8   Total Bilirubin 0.0 - 1.2 mg/dL 0.8  0.7  0.8   Alkaline Phos 44 - 121 IU/L 85  93  84   AST 0 - 40 IU/L 26  19  25    ALT 0 - 44 IU/L 22  16  19     ASSESSMENT & PLAN:  Assessment/Plan:  A 78 y.o. 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  As his hematocrit is only slightly above 45, he will be phlebotomized today and in 2 months.  He knows the goal is to get his hematocrit less than 45.  He also knows to continue  taking hydroxyurea  500 mg daily, which has been effective in keeping his white cells and platelets under ideal control.  He also knows to continue taking a baby aspirin  on a daily basis to offset any potential clotting complications his polycythemia rubra vera can cause.  I will see him back in 4 months for repeat clinical assessment. The patient understands all the plans discussed today and is in agreement with them.      Janeva Peaster Felicia Horde, MD

## 2024-02-25 ENCOUNTER — Inpatient Hospital Stay: Payer: Medicare Other

## 2024-02-25 ENCOUNTER — Inpatient Hospital Stay: Payer: Medicare Other | Admitting: Oncology

## 2024-02-25 ENCOUNTER — Other Ambulatory Visit: Payer: Self-pay | Admitting: Oncology

## 2024-02-25 ENCOUNTER — Inpatient Hospital Stay: Payer: Medicare Other | Attending: Oncology

## 2024-02-25 VITALS — BP 139/59 | HR 63 | Temp 97.8°F | Resp 16 | Ht 70.0 in | Wt 270.0 lb

## 2024-02-25 DIAGNOSIS — D45 Polycythemia vera: Secondary | ICD-10-CM | POA: Insufficient documentation

## 2024-02-25 LAB — CBC WITH DIFFERENTIAL (CANCER CENTER ONLY)
Abs Immature Granulocytes: 0.06 10*3/uL (ref 0.00–0.07)
Basophils Absolute: 0.1 10*3/uL (ref 0.0–0.1)
Basophils Relative: 1 %
Eosinophils Absolute: 0.4 10*3/uL (ref 0.0–0.5)
Eosinophils Relative: 3 %
HCT: 46.8 % (ref 39.0–52.0)
Hemoglobin: 13.4 g/dL (ref 13.0–17.0)
Immature Granulocytes: 1 %
Lymphocytes Relative: 11 %
Lymphs Abs: 1.3 10*3/uL (ref 0.7–4.0)
MCH: 22.7 pg — ABNORMAL LOW (ref 26.0–34.0)
MCHC: 28.6 g/dL — ABNORMAL LOW (ref 30.0–36.0)
MCV: 79.2 fL — ABNORMAL LOW (ref 80.0–100.0)
Monocytes Absolute: 0.7 10*3/uL (ref 0.1–1.0)
Monocytes Relative: 6 %
Neutro Abs: 9.7 10*3/uL — ABNORMAL HIGH (ref 1.7–7.7)
Neutrophils Relative %: 78 %
Platelet Count: 324 10*3/uL (ref 150–400)
RBC: 5.91 MIL/uL — ABNORMAL HIGH (ref 4.22–5.81)
RDW: 18.2 % — ABNORMAL HIGH (ref 11.5–15.5)
WBC Count: 12.2 10*3/uL — ABNORMAL HIGH (ref 4.0–10.5)
nRBC: 0 % (ref 0.0–0.2)
nRBC: 0 /100{WBCs}

## 2024-02-25 NOTE — Progress Notes (Signed)
 1050 PER DR. LEWIS GOALS FOR THIS PATIENT HCT < 45, WBC < 20 PLATELET < 400-600. HE WILL GET A PHLEBOTOMY TODAY AND AGAIN IN 2 MONTHS, HE WILL SEE HIM BACK IN 4 MONTHS.

## 2024-02-25 NOTE — Patient Instructions (Signed)

## 2024-02-26 ENCOUNTER — Telehealth: Payer: Self-pay | Admitting: Gastroenterology

## 2024-02-26 NOTE — Telephone Encounter (Signed)
 Inbound call from patient stating that he is having issues with constipation.Patient states he has tried MiraLax and its causing " peanut butter" constancy stools. Patient states the MiraLax is too strong.    Please advise.

## 2024-02-26 NOTE — Telephone Encounter (Signed)
 Left message for pt to call back

## 2024-02-29 NOTE — Telephone Encounter (Signed)
 Pt stated that he has been having ongoing issues with his stool of being a peanut butter consistency. Pt stated that he does not feel constipated nor is he having diarrhea. Pt chart was reviewed and noted the follow recommendations from recent office visit: -If still with problems, consider magnesium supplements -Increase water intake.  Recommendations were provided for pt.  Pt requested to set up an office visit.  Pt was scheduled for 03/18/2024 at 8:50 am with Dr. Venice Gillis.  Pt made aware.  Pt verbalized understanding with all questions answered.

## 2024-03-07 ENCOUNTER — Encounter: Payer: Self-pay | Admitting: Oncology

## 2024-03-11 ENCOUNTER — Ambulatory Visit: Admitting: Podiatry

## 2024-03-11 ENCOUNTER — Encounter: Payer: Self-pay | Admitting: Podiatry

## 2024-03-11 ENCOUNTER — Ambulatory Visit (INDEPENDENT_AMBULATORY_CARE_PROVIDER_SITE_OTHER)

## 2024-03-11 DIAGNOSIS — M2141 Flat foot [pes planus] (acquired), right foot: Secondary | ICD-10-CM | POA: Diagnosis not present

## 2024-03-11 DIAGNOSIS — M722 Plantar fascial fibromatosis: Secondary | ICD-10-CM | POA: Diagnosis not present

## 2024-03-11 DIAGNOSIS — M2142 Flat foot [pes planus] (acquired), left foot: Secondary | ICD-10-CM | POA: Diagnosis not present

## 2024-03-11 MED ORDER — DICLOFENAC SODIUM 1 % EX GEL: 2.0000 g | Freq: Four times a day (QID) | CUTANEOUS | 2 refills | Status: DC

## 2024-03-11 NOTE — Patient Instructions (Signed)
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
## 2024-03-18 ENCOUNTER — Encounter: Payer: Self-pay | Admitting: Gastroenterology

## 2024-03-18 ENCOUNTER — Ambulatory Visit: Admitting: Gastroenterology

## 2024-03-18 VITALS — BP 112/70 | HR 62 | Ht 70.0 in | Wt 269.2 lb

## 2024-03-18 DIAGNOSIS — Z860100 Personal history of colon polyps, unspecified: Secondary | ICD-10-CM | POA: Diagnosis not present

## 2024-03-18 DIAGNOSIS — K5909 Other constipation: Secondary | ICD-10-CM | POA: Diagnosis not present

## 2024-03-18 MED ORDER — LINACLOTIDE 145 MCG PO CAPS: 145.0000 ug | ORAL_CAPSULE | Freq: Every day | ORAL | 11 refills | Status: DC

## 2024-03-18 NOTE — Patient Instructions (Addendum)
 _______________________________________________________  If your blood pressure at your visit was 140/90 or greater, please contact your primary care physician to follow up on this.  _______________________________________________________  If you are age 78 or older, your body mass index should be between 23-30. Your Body mass index is 38.63 kg/m. If this is out of the aforementioned range listed, please consider follow up with your Primary Care Provider.  If you are age 67 or younger, your body mass index should be between 19-25. Your Body mass index is 38.63 kg/m. If this is out of the aformentioned range listed, please consider follow up with your Primary Care Provider.   ________________________________________________________  The [facility] providers would like to encourage you to use MYCHART to communicate with providers for non-urgent requests or questions.  Due to long hold times on the telephone, sending your provider a message by [facility] may be a faster and more efficient way to get a response.  Please allow 48 business hours for a response.  Please remember that this is for non-urgent requests.  _______________________________________________________  We have sent the following medications to your pharmacy for you to pick up at your convenience: Linezess 145mcg 1 capsule daily  Wean off miralax  Please call Dr. Hobert Lull nurse in 2 weeks at [phone] with an update on how you are doing.  Please follow up with Dr Reginal Capra. Increase water intake  Thank you,  Dr. Lajuan Pila

## 2024-03-18 NOTE — Progress Notes (Signed)
 Chief Complaint: For colonoscopy/constipation  Referring Provider:  Harvest Lineman, MD      ASSESSMENT AND PLAN;   #1. H/O polyps on colon 10/2023. No need to rpt d/t age  #2. Chronic constipation.  Likely exacerbated by meds. Failed Mg supplenents. Having mushy stools now on miralax.  Plan:  -Linzess 145mcg po every day #90 (samples) -Wean off Miralax -Call in 2 weeks. -If still with problems, start dulcolax 2 at night. If still with problems, then trulance or montegrity. -Has appt with Dr Reginal Capra for follow-up of prostate Ca. He will get CT done there. -Increase water intake.     HPI:    Leonard Washington is a 78 y.o. male  With history of CAD s/p CABG (Nl EF 60 to 65% 2021), HTN, HLD, polycythemia vera getting phelebotomy, history of prostate cancer, history of A-fib-recovered, off AC.  For FU  Discussed the use of AI scribe software for clinical note transcription with the patient, who gave verbal consent to proceed.  History of Present Illness Leonard Washington is a 78 year old male who presents with concerns about bowel movements and constipation management.  He is currently managing his bowel movements with Miralax. Initially, Miralax caused his stools to be of 'peanut butter consistency' and unformed, leading to excessive wiping and irritation. He adjusted the dose to a spoonful, which resulted in the first part of the stool being formed, but the remainder remained mushy, requiring frequent wiping. He has since increased the dose to half a cap, but the issue persists.  He previously used Linzess 290 mg once a month when he experienced constipation for two to three days, which effectively cleared his bowels. He tried magnesium supplements, which resulted in constipation for about a week, leading him to discontinue its use. He has since returned to using Miralax at a higher dose to manage his bowel movements.  He experiences gas and has tried Beano without  much success. He drinks water and occasionally beer, which he notes contributes to gas. No weight loss, abdominal pain, or blood in his stool. He has a history of radiation treatment, which he associates with the onset of his bowel issues.  Denies having any abdominal pain. Had Linzess 290mcg with resultant diarrhea.  He uses it once a month.  Denies having any upper GI symptoms including nausea, heartburn, regurgitation, odynophagia or dysphagia.  He is due for repeat colonoscopy as below.  No recent weight loss or loss of appetite.  He does drink plenty of water.  No alcohol.   Past GI workup: Colon 10/2023 - Five 4 to 6 mm polyps in the proximal transverse colon, in the proximal ascending colon, in the mid ascending colon and in the cecum, removed with a cold snare. Resected and retrieved. - Mild sigmoid diverticulosis. - Mild radiation proctitis. - Non- bleeding internal hemorrhoids. - The examination was otherwise normal on direct and retroflexion views. - Bx: TA - No need to rpt d/t age   Colon 11/2018  (Dr. Savannah Curlin) - Six 5 to 8 mm polyps in the transverse colon, at the hepatic flexure, in the ascending colon and in the cecum, removed with a cold snare. Resected and retrieved. - The examination was otherwise normal on direct and retroflexion views. -Biopsies tubular adenomas.  Repeat in 3 years. Previous colonoscopy also had polyps  CT AP with contrast 12/2019 IMPRESSION: 1. No acute findings within the abdomen or pelvis. No evidence for metastatic disease. 2. Splenomegaly. Aortic Atherosclerosis (ICD10-I70.0).  Past Medical History:  Diagnosis Date   Atrial fibrillation (HCC)    after COVID   BPH (benign prostatic hyperplasia)    Cataract 2016   Had Bil surgery   Chest congestion 11/29/2018   Colon polyps    Coronary atherosclerosis of native coronary artery    Vessel CABG 2005   Hyperlipidemia    Hypertension    Kidney stones    Myocardial infarction [facility]) 1997    Had stent put in 197/CABG in2005   Prostate cancer (HCC)    Shoulder pain    right  side   SOB (shortness of breath) on exertion     Past Surgical History:  Procedure Laterality Date   ARTERIAL DUPLEX  04/19/2004   Carotid-no evidence of significant plaque noted, no evidence of significant ICA stenosis, artery flow is antegrade. Upper extremities-within normal limits. Lower extremities-no evidence of lower extremity arterial occlusive disease at rest   BLADDER SURGERY     had a growth in bladder/benign   CARDIAC CATHETERIZATION  04/19/2004   severe four vessel coronary disease, continue vigorous medical therapy, recommendations for multivessel bypass grafting   CARDIOVASCULAR STRESS TEST  07/01/2009   normal pattern of perfusion in all regions, no scintigraphic evidence of inducible myocardial ischemia,EKG negative for ischemia, no ECG changes,   CATARACT EXTRACTION, BILATERAL     CORONARY ARTERY BYPASS GRAFT  04/20/2004   x4. LIMA to LAD, left radial artery to RCA, SVG to ramus intermedius, SVG to circ.   DOPPLER ECHOCARDIOGRAPHY  11/11/2012   EF 55-60%, wall motion normal, no regional wall motion abnormalities, thickness of ventricular septum was mildly increased, mild regurg of the mitral valve    Family History  Problem Relation Age of Onset   Heart failure Mother    Arthritis Mother    Heart disease Sister    Heart attack Maternal Uncle    Colon cancer Maternal Uncle    Prostate cancer Maternal Uncle    Heart attack Maternal Uncle    Heart attack Maternal Uncle    Heart attack Maternal Grandfather    Esophageal cancer Neg Hx    Pancreatic cancer Neg Hx    Stomach cancer Neg Hx     Social History   Tobacco Use   Smoking status: Former    Current packs/day: 0.00    Types: Cigarettes    Quit date: 04/17/1979    Years since quitting: 44.9   Smokeless tobacco: Never  Vaping Use   Vaping status: Never Used  Substance Use Topics   Alcohol use: Yes    Alcohol/week: 12.0  standard drinks of alcohol    Types: 12 Cans of beer per week    Comment: 2 beers per day   Drug use: No    Current Outpatient Medications  Medication Sig Dispense Refill   aspirin  EC 81 MG tablet Take 81 mg by mouth daily.      Calcium  Carbonate-Vit D-Min (CALCIUM  1200 PO) Take by mouth. Per patient taking 400 mg daily     diclofenac  Sodium (VOLTAREN ) 1 % GEL Apply 2 g topically 4 (four) times daily. Rub into affected area of foot 2 to 4 times daily 100 g 2   diltiazem  (CARDIZEM  CD) 120 MG 24 hr capsule TAKE 1 CAPSULE(120 MG) BY MOUTH DAILY 90 capsule 3   ezetimibe  (ZETIA ) 10 MG tablet TAKE 1 TABLET(10 MG) BY MOUTH DAILY 90 tablet 2   hydroxyurea  (HYDREA ) 500 MG capsule TAKE 1 CAPSULE BY MOUTH EVERY DAY 90 capsule  2   metoprolol  tartrate (LOPRESSOR ) 100 MG tablet Take 100 mg by mouth daily. Per patient taking 1/2 tablet (50 mg) of 100 mg daily     polyethylene glycol (MIRALAX / GLYCOLAX) 17 g packet Take 17 g by mouth as needed for mild constipation or moderate constipation.     rosuvastatin  (CRESTOR ) 40 MG tablet Take 1 tablet (40 mg total) by mouth daily. Please advise pt to make appt with provider for further refills 90 tablet 1   SSD 1 % cream Apply topically as needed.     No current facility-administered medications for this visit.    Allergies  Allergen Reactions   Zocor [Simvastatin]     Made fingers numb    Review of Systems:  Constitutional: Denies fever, chills, diaphoresis, appetite change and fatigue.  HEENT: Denies photophobia, eye pain, redness, hearing loss, ear pain, congestion, sore throat, rhinorrhea, sneezing, mouth sores, neck pain, neck stiffness and tinnitus.   Respiratory: Denies SOB, DOE, cough, chest tightness,  and wheezing.   Cardiovascular: Denies chest pain, palpitations and leg swelling.  Genitourinary: Denies dysuria, urgency, frequency, hematuria, flank pain and difficulty urinating.  Musculoskeletal: Denies myalgias, back pain, joint swelling,  arthralgias and gait problem.  Skin: No rash.  Neurological: Denies dizziness, seizures, syncope, weakness, light-headedness, numbness and headaches.  Hematological: Denies adenopathy. Easy bruising, personal or family bleeding history  Psychiatric/Behavioral: No anxiety or depression     Physical Exam:    BP 112/70   Pulse 62   Ht 5\' 10"  (1.778 m)   Wt 269 lb 4 oz (122.1 kg)   SpO2 98%   BMI 38.63 kg/m  Wt Readings from Last 3 Encounters:  03/18/24 269 lb 4 oz (122.1 kg)  02/25/24 270 lb (122.5 kg)  01/31/24 271 lb 6.4 oz (123.1 kg)   Constitutional:  Well-developed, in no acute distress. Psychiatric: Normal mood and affect. Behavior is normal. HEENT: Pupils normal.  Conjunctivae are normal. No scleral icterus. Neck supple.  Cardiovascular: Normal rate, regular rhythm. No edema Pulmonary/chest: Effort normal and breath sounds normal. No wheezing, rales or rhonchi. Abdominal: Soft, nondistended. Nontender. Bowel sounds active throughout. There are no masses palpable. No hepatomegaly. Rectal: Deferred.  To be performed at the time of colonoscopy. Neurological: Alert and oriented to person place and time. Skin: Skin is warm and dry. No rashes noted.  Data Reviewed: I have personally reviewed following labs and imaging studies  CBC:    Latest Ref Rng & Units 02/25/2024    9:18 AM 11/05/2023    9:46 AM 07/03/2023   12:00 AM  CBC  WBC 4.0 - 10.5 K/uL 12.2  14.0  11.1      Hemoglobin 13.0 - 17.0 g/dL 16.1  09.6  04.5      Hematocrit 39.0 - 52.0 % 46.8  50.1  51      Platelets 150 - 400 K/uL 324  314  283         This result is from an external source.    CMP:    Latest Ref Rng & Units 02/06/2023    8:11 AM 05/30/2021    9:29 AM 01/05/2021   11:40 AM  CMP  Glucose 70 - 99 mg/dL 90   93   BUN 8 - 27 mg/dL 13   12   Creatinine 4.09 - 1.27 mg/dL 8.11   9.14   Sodium 782 - 144 mmol/L 141   141   Potassium 3.5 - 5.2 mmol/L 4.7   4.4  Chloride 96 - 106 mmol/L 102   101   CO2  20 - 29 mmol/L 24   23   Calcium  8.6 - 10.2 mg/dL 9.1   9.6   Total Protein 6.0 - 8.5 g/dL 6.1  6.1  6.8   Total Bilirubin 0.0 - 1.2 mg/dL 0.8  0.7  0.8   Alkaline Phos 44 - 121 IU/L 85  93  84   AST 0 - 40 IU/L 26  19  25    ALT 0 - 44 IU/L 22  16  19          Magnus Schuller, MD 03/18/2024, 8:56 AM  Cc: Harvest Lineman, MD

## 2024-03-31 ENCOUNTER — Telehealth: Payer: Self-pay | Admitting: Gastroenterology

## 2024-03-31 NOTE — Telephone Encounter (Signed)
 Left message for pt to call back

## 2024-03-31 NOTE — Telephone Encounter (Signed)
 Patient called and stated that he was prescribed Linzess  but when taking then medication has not noticed a change in his BM. Patient also stated that he is having to pay 450 dollars for his Linzess . Patient also stated that he is taking Miralax again. Patient is wanting to know what else he could do since there has not been a change in his BM. Patient is requesting a call back from the nurse. Please advise.

## 2024-04-01 NOTE — Telephone Encounter (Signed)
 Left message for pt to call back

## 2024-04-03 NOTE — Telephone Encounter (Signed)
 Third attempt to reach patient. Left message for patient to call back & mailed letter home.

## 2024-04-08 ENCOUNTER — Ambulatory Visit: Admitting: Podiatry

## 2024-04-10 NOTE — Telephone Encounter (Signed)
 Left detailed message for pt to call back.

## 2024-04-10 NOTE — Telephone Encounter (Signed)
 Patient is calling because he stated that he received a letter that he needed to call back. Patient stated that he would like us  to call him back and leave a detailed VM. Please advise.

## 2024-04-11 NOTE — Telephone Encounter (Signed)
 Patient returned call & stated he tried Linzess  145 mcg samples for about 7 days and stopped d/t frequent loose stools and it cost $460. Previously advised to wean off miralax, however he is now taking miralax daily & has mushy, peanut butter consistency stools daily, and is hesitant to leave home. Feels this is a bigger problem. He would like further recommendations. Last seen with Dr. Venice Gillis 03/18/24. Advised pt we would contact him once Dr. Venice Gillis has reviewed.

## 2024-04-13 NOTE — Telephone Encounter (Signed)
 Stop all laxatives Just take dulcolax 2 tabs at bed time for now RG

## 2024-04-15 NOTE — Telephone Encounter (Signed)
 Returned call to patient, straight to vm again.

## 2024-04-15 NOTE — Telephone Encounter (Signed)
 Attempted to reach patient twice today. His phone goes straight to vm and his vm is currently full at this time. Will continue efforts to reach patient.

## 2024-04-15 NOTE — Telephone Encounter (Signed)
 Inbound call from patient returning phone call. Requesting a call back. Please advise, thank you.

## 2024-04-16 NOTE — Telephone Encounter (Signed)
 Patient returning phone call. Please advise, thank you.

## 2024-04-21 DIAGNOSIS — C61 Malignant neoplasm of prostate: Secondary | ICD-10-CM | POA: Diagnosis not present

## 2024-04-21 DIAGNOSIS — Z131 Encounter for screening for diabetes mellitus: Secondary | ICD-10-CM | POA: Diagnosis not present

## 2024-04-21 DIAGNOSIS — I2510 Atherosclerotic heart disease of native coronary artery without angina pectoris: Secondary | ICD-10-CM | POA: Diagnosis not present

## 2024-04-21 DIAGNOSIS — Z79899 Other long term (current) drug therapy: Secondary | ICD-10-CM | POA: Diagnosis not present

## 2024-04-21 DIAGNOSIS — E7800 Pure hypercholesterolemia, unspecified: Secondary | ICD-10-CM | POA: Diagnosis not present

## 2024-04-21 DIAGNOSIS — I10 Essential (primary) hypertension: Secondary | ICD-10-CM | POA: Diagnosis not present

## 2024-04-21 DIAGNOSIS — Z0000 Encounter for general adult medical examination without abnormal findings: Secondary | ICD-10-CM | POA: Diagnosis not present

## 2024-04-21 LAB — LAB REPORT - SCANNED: A1c: 5.3

## 2024-04-21 NOTE — Telephone Encounter (Signed)
 Returned call to patient. Phone continues to go straight to vm, and his vm is full. If patient returns call and I am unavailable please let him know recommendation below.   Stop all laxatives Just take dulcolax 2 tabs at bed time for now RG

## 2024-04-22 NOTE — Telephone Encounter (Signed)
 Unable to reach patient, we have been missing each others calls. No active MyChart. Mailed letter with rec's.

## 2024-04-23 DIAGNOSIS — C61 Malignant neoplasm of prostate: Secondary | ICD-10-CM | POA: Diagnosis not present

## 2024-04-23 DIAGNOSIS — K5900 Constipation, unspecified: Secondary | ICD-10-CM | POA: Diagnosis not present

## 2024-04-23 DIAGNOSIS — N529 Male erectile dysfunction, unspecified: Secondary | ICD-10-CM | POA: Diagnosis not present

## 2024-04-25 ENCOUNTER — Inpatient Hospital Stay: Attending: Oncology

## 2024-04-25 DIAGNOSIS — D45 Polycythemia vera: Secondary | ICD-10-CM | POA: Diagnosis not present

## 2024-04-25 NOTE — Patient Instructions (Signed)

## 2024-05-08 DIAGNOSIS — K080 Exfoliation of teeth due to systemic causes: Secondary | ICD-10-CM | POA: Diagnosis not present

## 2024-05-19 ENCOUNTER — Other Ambulatory Visit: Payer: Self-pay | Admitting: Cardiovascular Disease

## 2024-05-19 DIAGNOSIS — E782 Mixed hyperlipidemia: Secondary | ICD-10-CM

## 2024-05-19 DIAGNOSIS — I2510 Atherosclerotic heart disease of native coronary artery without angina pectoris: Secondary | ICD-10-CM

## 2024-06-01 ENCOUNTER — Other Ambulatory Visit: Payer: Self-pay | Admitting: Cardiovascular Disease

## 2024-06-18 DIAGNOSIS — K080 Exfoliation of teeth due to systemic causes: Secondary | ICD-10-CM | POA: Diagnosis not present

## 2024-06-25 NOTE — Progress Notes (Unsigned)
 [facility]  [address] [place],  [place]  [address] [phone]  Clinic Day:  06/25/2024  Referring physician: Clemmie Nest, MD   HISTORY OF PRESENT ILLNESS:  The patient is a 78 y.o. 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  He has been phlebotomized monthly over these past months to get his hematocrit under 45.  He is also on hydroxyurea  500 mg daily to keep his white cells and platelets from becoming more elevated over time.  He comes in today to reassess his peripheral counts.  Since his last visit, the patient has been doing well. With respect to his polycythemia rubra vera, he denies having any hyperviscosity-related symptoms from this underlying condition.    PHYSICAL EXAM:  There were no vitals taken for this visit. Wt Readings from Last 3 Encounters:  04/25/24 267 lb (121.1 kg)  03/18/24 269 lb 4 oz (122.1 kg)  02/25/24 270 lb (122.5 kg)   There is no height or weight on file to calculate BMI. Performance status (ECOG): 1 - Symptomatic but completely ambulatory Physical Exam Constitutional:      Appearance: Normal appearance. He is not ill-appearing.  HENT:     Mouth/Throat:     Mouth: Mucous membranes are moist.     Pharynx: Oropharynx is clear. No oropharyngeal exudate or posterior oropharyngeal erythema.  Cardiovascular:     Rate and Rhythm: Normal rate and regular rhythm.     Heart sounds: No murmur heard.    No friction rub. No gallop.  Pulmonary:     Effort: Pulmonary effort is normal. No respiratory distress.     Breath sounds: Normal breath sounds. No wheezing, rhonchi or rales.  Abdominal:     General: Bowel sounds are normal. There is no distension.     Palpations: Abdomen is soft. There is no mass.     Tenderness: There is no abdominal tenderness.  Musculoskeletal:        General: No swelling.     Right lower leg: No edema.     Left lower leg: No edema.  Lymphadenopathy:     Cervical: No cervical adenopathy.      Upper Body:     Right upper body: No supraclavicular or axillary adenopathy.     Left upper body: No supraclavicular or axillary adenopathy.     Lower Body: No right inguinal adenopathy. No left inguinal adenopathy.  Skin:    General: Skin is warm.     Coloration: Skin is not jaundiced.     Findings: No lesion or rash.  Neurological:     General: No focal deficit present.     Mental Status: He is alert and oriented to person, place, and time. Mental status is at baseline.  Psychiatric:        Mood and Affect: Mood normal.        Behavior: Behavior normal.        Thought Content: Thought content normal.    LABS:      Latest Ref Rng & Units 02/25/2024    9:18 AM 11/05/2023    9:46 AM 07/03/2023   12:00 AM  CBC  WBC 4.0 - 10.5 K/uL 12.2  14.0  11.1      Hemoglobin 13.0 - 17.0 g/dL 86.5  84.4  83.3      Hematocrit 39.0 - 52.0 % 46.8  50.1  51      Platelets 150 - 400 K/uL 324  314  283  This result is from an external source.      Latest Ref Rng & Units 02/06/2023    8:11 AM 05/30/2021    9:29 AM 01/05/2021   11:40 AM  CMP  Glucose 70 - 99 mg/dL 90   93   BUN 8 - 27 mg/dL 13   12   Creatinine 9.23 - 1.27 mg/dL 9.00   9.19   Sodium 865 - 144 mmol/L 141   141   Potassium 3.5 - 5.2 mmol/L 4.7   4.4   Chloride 96 - 106 mmol/L 102   101   CO2 20 - 29 mmol/L 24   23   Calcium  8.6 - 10.2 mg/dL 9.1   9.6   Total Protein 6.0 - 8.5 g/dL 6.1  6.1  6.8   Total Bilirubin 0.0 - 1.2 mg/dL 0.8  0.7  0.8   Alkaline Phos 44 - 121 IU/L 85  93  84   AST 0 - 40 IU/L 26  19  25    ALT 0 - 44 IU/L 22  16  19     ASSESSMENT & PLAN:  Assessment/Plan:  A 78 y.o. 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  As his hematocrit is only slightly above 45, he will be phlebotomized today and in 2 months.  He knows the goal is to get his hematocrit less than 45.  He also knows to continue taking hydroxyurea  500 mg daily, which has been effective in keeping his white cells and platelets under ideal control.   He also knows to continue taking a baby aspirin  on a daily basis to offset any potential clotting complications his polycythemia rubra vera can cause.  I will see him back in 4 months for repeat clinical assessment. The patient understands all the plans discussed today and is in agreement with them.      Johnatha Zeidman DELENA Kerns, MD

## 2024-06-26 ENCOUNTER — Inpatient Hospital Stay

## 2024-06-26 ENCOUNTER — Other Ambulatory Visit: Payer: Self-pay | Admitting: Oncology

## 2024-06-26 ENCOUNTER — Inpatient Hospital Stay: Attending: Oncology | Admitting: Oncology

## 2024-06-26 ENCOUNTER — Other Ambulatory Visit: Payer: Self-pay

## 2024-06-26 VITALS — BP 124/73 | HR 63 | Temp 98.3°F | Resp 18

## 2024-06-26 VITALS — BP 144/70 | HR 60 | Temp 98.3°F | Resp 18 | Ht 70.0 in | Wt 261.0 lb

## 2024-06-26 DIAGNOSIS — D45 Polycythemia vera: Secondary | ICD-10-CM

## 2024-06-26 DIAGNOSIS — Z79899 Other long term (current) drug therapy: Secondary | ICD-10-CM | POA: Insufficient documentation

## 2024-06-26 DIAGNOSIS — Z7964 Long term (current) use of myelosuppressive agent: Secondary | ICD-10-CM | POA: Diagnosis not present

## 2024-06-26 LAB — CBC WITH DIFFERENTIAL (CANCER CENTER ONLY)
Abs Immature Granulocytes: 0.08 K/uL — ABNORMAL HIGH (ref 0.00–0.07)
Basophils Absolute: 0.2 K/uL — ABNORMAL HIGH (ref 0.0–0.1)
Basophils Relative: 1 %
Eosinophils Absolute: 0.4 K/uL (ref 0.0–0.5)
Eosinophils Relative: 3 %
HCT: 48.6 % (ref 39.0–52.0)
Hemoglobin: 13.8 g/dL (ref 13.0–17.0)
Immature Granulocytes: 1 %
Lymphocytes Relative: 11 %
Lymphs Abs: 1.4 K/uL (ref 0.7–4.0)
MCH: 21.7 pg — ABNORMAL LOW (ref 26.0–34.0)
MCHC: 28.4 g/dL — ABNORMAL LOW (ref 30.0–36.0)
MCV: 76.3 fL — ABNORMAL LOW (ref 80.0–100.0)
Monocytes Absolute: 0.7 K/uL (ref 0.1–1.0)
Monocytes Relative: 6 %
Neutro Abs: 9.3 K/uL — ABNORMAL HIGH (ref 1.7–7.7)
Neutrophils Relative %: 78 %
Platelet Count: 293 K/uL (ref 150–400)
RBC: 6.37 MIL/uL — ABNORMAL HIGH (ref 4.22–5.81)
RDW: 20.2 % — ABNORMAL HIGH (ref 11.5–15.5)
WBC Count: 12 K/uL — ABNORMAL HIGH (ref 4.0–10.5)
nRBC: 0 % (ref 0.0–0.2)

## 2024-06-26 NOTE — Patient Instructions (Signed)

## 2024-06-26 NOTE — Progress Notes (Signed)
 Leonard Washington presents today for phlebotomy per MD orders. Phlebotomy procedure started at 1100 and ended at 1115. 500 grams removed. Patient observed for 30 minutes after procedure without any incident. Patient tolerated procedure well. IV needle removed intact.

## 2024-07-28 ENCOUNTER — Inpatient Hospital Stay: Attending: Oncology

## 2024-07-28 DIAGNOSIS — D45 Polycythemia vera: Secondary | ICD-10-CM | POA: Insufficient documentation

## 2024-07-28 NOTE — Progress Notes (Signed)
 Leonard Washington presents today for phlebotomy per MD orders. Phlebotomy procedure started at 0845 and ended at 0855. 500 grams removed. Patient observed for 30 minutes after procedure without any incident. Patient tolerated procedure well. IV needle removed intact.

## 2024-07-28 NOTE — Patient Instructions (Signed)

## 2024-07-28 NOTE — Addendum Note (Signed)
 Addended by: JENI BRIDGETTE MATSU on: 07/28/2024 08:35 AM   Modules accepted: Orders

## 2024-08-26 ENCOUNTER — Inpatient Hospital Stay: Attending: Oncology

## 2024-08-26 VITALS — BP 130/66 | HR 75 | Temp 98.0°F | Resp 18 | Ht 70.0 in

## 2024-08-26 DIAGNOSIS — D45 Polycythemia vera: Secondary | ICD-10-CM | POA: Insufficient documentation

## 2024-08-26 DIAGNOSIS — Z79899 Other long term (current) drug therapy: Secondary | ICD-10-CM | POA: Diagnosis not present

## 2024-08-26 NOTE — Progress Notes (Signed)
 Leonard Washington presents today for phlebotomy per MD orders. Phlebotomy procedure started at 0835 and ended at 0845. 500 grams removed. Patient observed for 30 minutes after procedure without any incident. Patient tolerated procedure well. IV needle removed intact.

## 2024-08-26 NOTE — Patient Instructions (Signed)

## 2024-09-09 DIAGNOSIS — K080 Exfoliation of teeth due to systemic causes: Secondary | ICD-10-CM | POA: Diagnosis not present

## 2024-09-28 NOTE — Progress Notes (Deleted)
 [facility] [address] [place],  [place]  [address] [phone]  Clinic Day:  09/28/2024  Referring physician: Clemmie Nest, MD   HISTORY OF PRESENT ILLNESS:  The patient is a 78 y.o. 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  He has been phlebotomized ever 2 months over these past months to get his hematocrit under 45.  He is also on hydroxyurea  500 mg daily to keep his white cells and platelets from becoming more elevated over time.  He comes in today to reassess his peripheral counts.  Since his last visit, the patient has been doing well. With respect to his polycythemia rubra vera, he denies having any hyperviscosity-related symptoms from this underlying condition.   PHYSICAL EXAM:  There were no vitals taken for this visit. Wt Readings from Last 3 Encounters:  07/28/24 265 lb 4 oz (120.3 kg)  06/26/24 261 lb (118.4 kg)  04/25/24 267 lb (121.1 kg)   There is no height or weight on file to calculate BMI. Performance status (ECOG): 1 - Symptomatic but completely ambulatory Physical Exam Constitutional:      Appearance: Normal appearance. He is not ill-appearing.  HENT:     Mouth/Throat:     Mouth: Mucous membranes are moist.     Pharynx: Oropharynx is clear. No oropharyngeal exudate or posterior oropharyngeal erythema.  Cardiovascular:     Rate and Rhythm: Normal rate and regular rhythm.     Heart sounds: No murmur heard.    No friction rub. No gallop.  Pulmonary:     Effort: Pulmonary effort is normal. No respiratory distress.     Breath sounds: Normal breath sounds. No wheezing, rhonchi or rales.  Abdominal:     General: Bowel sounds are normal. There is no distension.     Palpations: Abdomen is soft. There is no mass.     Tenderness: There is no abdominal tenderness.  Musculoskeletal:        General: No swelling.     Right lower leg: No edema.     Left lower leg: No edema.  Lymphadenopathy:     Cervical: No cervical  adenopathy.     Upper Body:     Right upper body: No supraclavicular or axillary adenopathy.     Left upper body: No supraclavicular or axillary adenopathy.     Lower Body: No right inguinal adenopathy. No left inguinal adenopathy.  Skin:    General: Skin is warm.     Coloration: Skin is not jaundiced.     Findings: No lesion or rash.  Neurological:     General: No focal deficit present.     Mental Status: He is alert and oriented to person, place, and time. Mental status is at baseline.  Psychiatric:        Mood and Affect: Mood normal.        Behavior: Behavior normal.        Thought Content: Thought content normal.    LABS:      Latest Ref Rng & Units 06/26/2024    9:42 AM 02/25/2024    9:18 AM 11/05/2023    9:46 AM  CBC  WBC 4.0 - 10.5 K/uL 12.0  12.2  14.0   Hemoglobin 13.0 - 17.0 g/dL 86.1  86.5  84.4   Hematocrit 39.0 - 52.0 % 48.6  46.8  50.1   Platelets 150 - 400 K/uL 293  324  314       Latest Ref Rng & Units  02/06/2023    8:11 AM 05/30/2021    9:29 AM 01/05/2021   11:40 AM  CMP  Glucose 70 - 99 mg/dL 90   93   BUN 8 - 27 mg/dL 13   12   Creatinine 9.23 - 1.27 mg/dL 9.00   9.19   Sodium 865 - 144 mmol/L 141   141   Potassium 3.5 - 5.2 mmol/L 4.7   4.4   Chloride 96 - 106 mmol/L 102   101   CO2 20 - 29 mmol/L 24   23   Calcium  8.6 - 10.2 mg/dL 9.1   9.6   Total Protein 6.0 - 8.5 g/dL 6.1  6.1  6.8   Total Bilirubin 0.0 - 1.2 mg/dL 0.8  0.7  0.8   Alkaline Phos 44 - 121 IU/L 85  93  84   AST 0 - 40 IU/L 26  19  25    ALT 0 - 44 IU/L 22  16  19     ASSESSMENT & PLAN:  Assessment/Plan:  A 78 y.o. 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  As his hematocrit has risen higher despite bimonthly phlebotomies, I will arrange for him to be phlebotomized monthly over these next few months, with the goal to get his hematocrit less than 45.  He also knows to continue taking hydroxyurea  500 mg daily, which has been effective in keeping his white cells and platelets under ideal  control.  He also knows to continue taking a baby aspirin  on a daily basis to offset any potential clotting complications his polycythemia rubra vera can cause.  I will see him back in 3 months for repeat clinical assessment. The patient understands all the plans discussed today and is in agreement with them.    Anmol Fleck DELENA Kerns, MD

## 2024-09-29 ENCOUNTER — Inpatient Hospital Stay: Admitting: Oncology

## 2024-09-29 ENCOUNTER — Inpatient Hospital Stay

## 2024-09-29 ENCOUNTER — Inpatient Hospital Stay: Attending: Oncology

## 2024-09-29 DIAGNOSIS — Z79899 Other long term (current) drug therapy: Secondary | ICD-10-CM | POA: Insufficient documentation

## 2024-09-29 DIAGNOSIS — Z7964 Long term (current) use of myelosuppressive agent: Secondary | ICD-10-CM | POA: Insufficient documentation

## 2024-09-29 DIAGNOSIS — D45 Polycythemia vera: Secondary | ICD-10-CM | POA: Insufficient documentation

## 2024-10-05 NOTE — Progress Notes (Unsigned)
 Leonard Washington at [facility] [address] [place],  [place]  [address] [phone]  Clinic Day:  10/06/2024  Referring physician: Clemmie Nest, MD   HISTORY OF PRESENT ILLNESS:  The patient is a 78 y.o. 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  He has been phlebotomized monthly over the past 3 months to get his hematocrit under 45.  He is also on hydroxyurea  500 mg daily to keep his white cells and platelets from becoming more elevated over time.  He comes in today to reassess his peripheral counts.  Since his last visit, the patient has been doing well. With respect to his polycythemia rubra vera, he denies having any hyperviscosity-related symptoms from this underlying condition.   PHYSICAL EXAM:  Blood pressure (!) 145/72, pulse 68, temperature 97.7 F (36.5 C), temperature source Oral, resp. rate 18, height 5' 10 (1.778 m), weight 259 lb 6.4 oz (117.7 kg), SpO2 97%. Wt Readings from Last 3 Encounters:  10/06/24 259 lb 6.4 oz (117.7 kg)  07/28/24 265 lb 4 oz (120.3 kg)  06/26/24 261 lb (118.4 kg)   Body mass index is 37.22 kg/m. Performance status (ECOG): 1 - Symptomatic but completely ambulatory Physical Exam Constitutional:      Appearance: Normal appearance. He is not ill-appearing.  HENT:     Mouth/Throat:     Mouth: Mucous membranes are moist.     Pharynx: Oropharynx is clear. No oropharyngeal exudate or posterior oropharyngeal erythema.  Cardiovascular:     Rate and Rhythm: Normal rate and regular rhythm.     Heart sounds: No murmur heard.    No friction rub. No gallop.  Pulmonary:     Effort: Pulmonary effort is normal. No respiratory distress.     Breath sounds: Normal breath sounds. No wheezing, rhonchi or rales.  Abdominal:     General: Bowel sounds are normal. There is no distension.     Palpations: Abdomen is soft. There is no mass.     Tenderness: There is no abdominal tenderness.  Musculoskeletal:        General: No swelling.      Right lower leg: No edema.     Left lower leg: No edema.  Lymphadenopathy:     Cervical: No cervical adenopathy.     Upper Body:     Right upper body: No supraclavicular or axillary adenopathy.     Left upper body: No supraclavicular or axillary adenopathy.     Lower Body: No right inguinal adenopathy. No left inguinal adenopathy.  Skin:    General: Skin is warm.     Coloration: Skin is not jaundiced.     Findings: No lesion or rash.  Neurological:     General: No focal deficit present.     Mental Status: He is alert and oriented to person, place, and time. Mental status is at baseline.  Psychiatric:        Mood and Affect: Mood normal.        Behavior: Behavior normal.        Thought Content: Thought content normal.    LABS:      Latest Ref Rng & Units 10/06/2024   11:05 AM 06/26/2024    9:42 AM 02/25/2024    9:18 AM  CBC  WBC 4.0 - 10.5 K/uL 13.7  12.0  12.2   Hemoglobin 13.0 - 17.0 g/dL 87.4  86.1  86.5   Hematocrit 39.0 - 52.0 % 44.4  48.6  46.8   Platelets 150 -  400 K/uL 302  293  324       Latest Ref Rng & Units 02/06/2023    8:11 AM 05/30/2021    9:29 AM 01/05/2021   11:40 AM  CMP  Glucose 70 - 99 mg/dL 90   93   BUN 8 - 27 mg/dL 13   12   Creatinine 9.23 - 1.27 mg/dL 9.00   9.19   Sodium 865 - 144 mmol/L 141   141   Potassium 3.5 - 5.2 mmol/L 4.7   4.4   Chloride 96 - 106 mmol/L 102   101   CO2 20 - 29 mmol/L 24   23   Calcium  8.6 - 10.2 mg/dL 9.1   9.6   Total Protein 6.0 - 8.5 g/dL 6.1  6.1  6.8   Total Bilirubin 0.0 - 1.2 mg/dL 0.8  0.7  0.8   Alkaline Phos 44 - 121 IU/L 85  93  84   AST 0 - 40 IU/L 26  19  25    ALT 0 - 44 IU/L 22  16  19     ASSESSMENT & PLAN:  Assessment/Plan:  A 78 y.o. 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  I am pleased as his is now less than 45.  Based upon this, additional phlebotomies will be held.  He knows to continue taking hydroxyurea  500 mg daily, which has been effective in keeping his white cells and platelets under ideal  control.  He also knows to continue taking a baby aspirin  on a daily basis to offset any potential clotting complications his polycythemia rubra vera can cause.  I will see him back in 4 months for repeat clinical assessment. The patient understands all the plans discussed today and is in agreement with them.    Leonard Washington Leonard Kerns, MD

## 2024-10-06 ENCOUNTER — Other Ambulatory Visit: Payer: Self-pay | Admitting: Oncology

## 2024-10-06 ENCOUNTER — Inpatient Hospital Stay

## 2024-10-06 ENCOUNTER — Inpatient Hospital Stay: Admitting: Oncology

## 2024-10-06 VITALS — BP 145/72 | HR 68 | Temp 97.7°F | Resp 18 | Ht 70.0 in | Wt 259.4 lb

## 2024-10-06 DIAGNOSIS — Z79899 Other long term (current) drug therapy: Secondary | ICD-10-CM | POA: Diagnosis not present

## 2024-10-06 DIAGNOSIS — D45 Polycythemia vera: Secondary | ICD-10-CM

## 2024-10-06 DIAGNOSIS — Z7964 Long term (current) use of myelosuppressive agent: Secondary | ICD-10-CM | POA: Diagnosis not present

## 2024-10-06 LAB — CBC WITH DIFFERENTIAL (CANCER CENTER ONLY)
Abs Immature Granulocytes: 0.05 K/uL (ref 0.00–0.07)
Basophils Absolute: 0.2 K/uL — ABNORMAL HIGH (ref 0.0–0.1)
Basophils Relative: 2 %
Eosinophils Absolute: 0.4 K/uL (ref 0.0–0.5)
Eosinophils Relative: 3 %
HCT: 44.4 % (ref 39.0–52.0)
Hemoglobin: 12.5 g/dL — ABNORMAL LOW (ref 13.0–17.0)
Immature Granulocytes: 0 %
Lymphocytes Relative: 10 %
Lymphs Abs: 1.4 K/uL (ref 0.7–4.0)
MCH: 20.9 pg — ABNORMAL LOW (ref 26.0–34.0)
MCHC: 28.2 g/dL — ABNORMAL LOW (ref 30.0–36.0)
MCV: 74.1 fL — ABNORMAL LOW (ref 80.0–100.0)
Monocytes Absolute: 0.6 K/uL (ref 0.1–1.0)
Monocytes Relative: 4 %
Neutro Abs: 11.1 K/uL — ABNORMAL HIGH (ref 1.7–7.7)
Neutrophils Relative %: 81 %
Platelet Count: 302 K/uL (ref 150–400)
RBC: 5.99 MIL/uL — ABNORMAL HIGH (ref 4.22–5.81)
RDW: 19.4 % — ABNORMAL HIGH (ref 11.5–15.5)
WBC Count: 13.7 K/uL — ABNORMAL HIGH (ref 4.0–10.5)
nRBC: 0 % (ref 0.0–0.2)

## 2024-11-06 ENCOUNTER — Telehealth: Payer: Self-pay | Admitting: Cardiovascular Disease

## 2024-11-06 NOTE — Telephone Encounter (Signed)
 Patient has been scheduled per VM.MS

## 2024-11-06 NOTE — Telephone Encounter (Signed)
 Called and left VM for patient to call me to schedule follow up appt for the recall put in for Dr Wonda. Letter came in from patient about gettign an appt and how hard it is to get into your schedule. I left my direct line and will set a reminder to call the patient as more schedules open.  Letter in mailbox.

## 2024-11-06 NOTE — Telephone Encounter (Signed)
 Attempted to call pt to off him an appt at the end of March, unable to reach. LMTCB.

## 2024-11-13 NOTE — Progress Notes (Signed)
 Leonard Washington                                          MRN: [id number]   11/13/2024   The [facility] Quality Team Specialist reviewed this patient medical record for the purposes of chart review for care gap closure. The following were reviewed: chart review for care gap closure-controlling blood pressure.    [facility] Quality Team

## 2024-11-26 ENCOUNTER — Telehealth: Payer: Self-pay

## 2024-11-26 NOTE — Telephone Encounter (Signed)
 Attempted to call pt, unable to reach. LMTCB to see if pt would like to move appt to this Friday 11/28/24.

## 2024-11-26 NOTE — Telephone Encounter (Signed)
-----   Message from Ozell Fell, MD sent at 11/19/2024  4:42 PM EST ----- This is the patient who is trying to make his appt and sent me the letter. Can you get him in?

## 2024-11-28 ENCOUNTER — Ambulatory Visit: Attending: Cardiovascular Disease | Admitting: Cardiovascular Disease

## 2024-11-28 ENCOUNTER — Encounter: Payer: Self-pay | Admitting: Cardiovascular Disease

## 2024-11-28 VITALS — BP 120/64 | HR 70 | Ht 71.0 in | Wt 258.8 lb

## 2024-11-28 DIAGNOSIS — I10 Essential (primary) hypertension: Secondary | ICD-10-CM

## 2024-11-28 DIAGNOSIS — R011 Cardiac murmur, unspecified: Secondary | ICD-10-CM

## 2024-11-28 DIAGNOSIS — I480 Paroxysmal atrial fibrillation: Secondary | ICD-10-CM | POA: Diagnosis not present

## 2024-11-28 DIAGNOSIS — I2510 Atherosclerotic heart disease of native coronary artery without angina pectoris: Secondary | ICD-10-CM | POA: Diagnosis not present

## 2024-11-28 DIAGNOSIS — E782 Mixed hyperlipidemia: Secondary | ICD-10-CM | POA: Diagnosis not present

## 2024-11-28 NOTE — Assessment & Plan Note (Addendum)
 Stable without anginal symptoms on metoprolol  and diltiazem  for antianginal therapy, aspirin  for antiplatelet treatment, and high intensity statin drug.

## 2024-11-28 NOTE — Progress Notes (Signed)
 " Cardiology Office Note:    Date:  11/28/2024   ID:  Leonard Washington, DOB 05/09/1946, MRN [id number]  PCP:  Clemmie Nest, MD   [facility] Providers Cardiologist:  Ozell Fell, MD     Referring MD: Clemmie Nest, MD   Chief Complaint  Patient presents with   Coronary Artery Disease    History of Present Illness:    Leonard Washington is a 79 y.o. male with a hx of coronary artery disease status post remote CABG, presenting for follow-up evaluation.  The patient underwent four-vessel CABG in 2005 with a LIMA to LAD, left radial to RCA, saphenous vein graft to ramus intermedius, and saphenous vein graft to left circumflex marginal.  Comorbid conditions include hypertension and mixed hyperlipidemia.  Noncardiac issues include prostate cancer and polycythemia vera.   The patient is here alone today.  He has been doing well with no recent symptoms of chest pain, chest pressure, or shortness of breath.  He is compliant with his medications.  States that he should be doing some more walking than he currently does, but the bad weather has made it difficult for him.  He has no cardiac-related concerns today.  Current Medications: Active Medications[1]   Allergies:   Zocor [simvastatin]   ROS:   Please see the history of present illness.    All other systems reviewed and are negative.  EKGs/Labs/Other Studies Reviewed:    The following studies were reviewed today: Cardiac Studies & Procedures   ______________________________________________________________________________________________   STRESS TESTS  MYOCARDIAL PERFUSION IMAGING 01/29/2020  Interpretation Summary  The left ventricular ejection fraction is hyperdynamic (>65%).  Nuclear stress EF: 66%.  There was no ST segment deviation noted during stress.  The study is normal.  This is a low risk study.  No change from prior study.   ECHOCARDIOGRAM  ECHOCARDIOGRAM LIMITED  01/02/2020  Narrative ECHOCARDIOGRAM LIMITED REPORT    Patient Name:   Leonard Washington Date of Exam: 01/02/2020 Medical Rec #:  [id number]             Height:       71.0 in Accession #:    [id number]            Weight:       253.7 lb Date of Birth:  [birth year]              BSA:          2.333 m Patient Age:    73 years              BP:           121/69 mmHg Patient Gender: M                     HR:           77 bpm. Exam Location:  Inpatient  Procedure: Limited Echo, Cardiac Doppler and Limited Color Doppler  MODIFIED REPORT: This report was modified by Leonard Fuel MD on 01/09/2020 due to fix report. Indications:     R94.31 Abnormal EKG  History:         Patient has prior history of Echocardiogram examinations, most recent 07/27/2016. Previous Myocardial Infarction, Signs/Symptoms:Dyspnea; Risk Factors:Hypertension and Dyslipidemia. COVID19 Positive.  Sonographer:     Annabella Dance Referring Phys:  [id number] MJRYJO A DAVID Diagnosing Phys: Leonard Fuel MD  IMPRESSIONS   1. Left ventricular ejection fraction, by estimation, is 60 to 65%. The left ventricle has  normal function. The left ventricle has no regional wall motion abnormalities. There is mild left ventricular hypertrophy. Tissue doppler not performed. 2. The right ventricular size is normal. 3. Left atrial size was mild to moderately dilated. 4. Right atrial size was mildly dilated. 5. The mitral valve is grossly normal. Trivial mitral valve regurgitation. 6. The aortic valve is tricuspid. Aortic valve regurgitation is not visualized. Mild aortic valve sclerosis is present, with no evidence of aortic valve stenosis. 7. Limited images due to poor acoustic windowsL  FINDINGS Left Ventricle: Left ventricular ejection fraction, by estimation, is 60 to 65%. The left ventricle has normal function. The left ventricle has no regional wall motion abnormalities. There is mild left ventricular hypertrophy.  Right Ventricle:  The right ventricular size is normal. No increase in right ventricular wall thickness.  Left Atrium: Left atrial size was mild to moderately dilated.  Right Atrium: Right atrial size was mildly dilated.  Pericardium: There is no evidence of pericardial effusion.  Mitral Valve: The mitral valve is grossly normal. Trivial mitral valve regurgitation.  Tricuspid Valve: The tricuspid valve is normal in structure. Tricuspid valve regurgitation is not demonstrated.  Aortic Valve: The aortic valve is tricuspid. Aortic valve regurgitation is not visualized. Mild aortic valve sclerosis is present, with no evidence of aortic valve stenosis. There is mild calcification of the aortic valve.  Pulmonic Valve: The pulmonic valve was grossly normal. Pulmonic valve regurgitation is trivial.  Aorta: The aortic root and ascending aorta are structurally normal, with no evidence of dilitation.  Venous: The inferior vena cava was not well visualized.   LEFT VENTRICLE PLAX 2D LVIDd:         4.50 cm LVIDs:         4.10 cm LV PW:         1.20 cm LV IVS:        1.00 cm LVOT diam:     2.20 cm LV SV:         84 LV SV Index:   36 LVOT Area:     3.80 cm   RIGHT VENTRICLE          IVC RV Basal diam:  2.70 cm  IVC diam: 1.90 cm  LEFT ATRIUM             Index       RIGHT ATRIUM           Index LA diam:        4.70 cm 2.01 cm/m  RA Area:     14.70 cm LA Vol (A2C):   60.4 ml 25.89 ml/m RA Volume:   36.50 ml  15.64 ml/m LA Vol (A4C):   52.8 ml 22.63 ml/m LA Biplane Vol: 58.3 ml 24.99 ml/m AORTIC VALVE LVOT Vmax:   123.00 cm/s LVOT Vmean:  75.000 cm/s LVOT VTI:    0.222 m  AORTA Ao Root diam: 3.90 cm Ao Asc diam:  3.20 cm  MITRAL VALVE MV Area (PHT): 3.72 cm    SHUNTS MV Decel Time: 204 msec    Systemic VTI:  0.22 m MV E velocity: 76.60 cm/s  Systemic Diam: 2.20 cm MV A velocity: 72.80 cm/s MV E/A ratio:  1.05  Leonard Fuel MD Electronically signed by Leonard Fuel MD Signature  Date/Time: 01/02/2020/2:30:39 PM    Final (Updated)    MONITORS  CARDIAC EVENT MONITOR 06/10/2020  Narrative 1. The basic rhythm is normal sinus with an average HR of 69 bpm 2. No atrial fibrillation  or flutter 3. No high-grade heart block or pathologic pauses 4. There are occasional PVC's and rare supraventricular beats without sustained arrhythmias. There is a single ventricular run of 8 beats.       ______________________________________________________________________________________________      EKG:        Recent Labs: 10/06/2024: Hemoglobin 12.5; Platelet Count 302  Recent Lipid Panel    Component Value Date/Time   CHOL 117 02/06/2023 0815   TRIG 132 02/06/2023 0815   HDL 40 02/06/2023 0815   CHOLHDL 2.9 02/06/2023 0815   CHOLHDL 3.1 07/13/2016 0832   VLDL 21 07/13/2016 0832   LDLCALC 54 02/06/2023 0815     Risk Assessment/Calculations:                Physical Exam:    VS:  BP 120/64 (BP Location: Right Arm, Patient Position: Sitting, Cuff Size: Large)   Pulse 70   Ht 5' 11 (1.803 m)   Wt 258 lb 12.8 oz (117.4 kg)   SpO2 96%   BMI 36.10 kg/m     Wt Readings from Last 3 Encounters:  11/28/24 258 lb 12.8 oz (117.4 kg)  10/06/24 259 lb 6.4 oz (117.7 kg)  07/28/24 265 lb 4 oz (120.3 kg)     GEN:  Well nourished, well developed in no acute distress HEENT: Normal NECK: No JVD; No carotid bruits LYMPHATICS: No lymphadenopathy CARDIAC: RRR, no murmurs, rubs, gallops RESPIRATORY:  Clear to auscultation without rales, wheezing or rhonchi  ABDOMEN: Soft, non-tender, non-distended MUSCULOSKELETAL:  No edema; No deformity  SKIN: Warm and dry NEUROLOGIC:  Alert and oriented x 3 PSYCHIATRIC:  Normal affect   Assessment & Plan Coronary artery disease involving native coronary artery of native heart without angina pectoris Stable without anginal symptoms on metoprolol  and diltiazem  for antianginal therapy, aspirin  for antiplatelet treatment, and high  intensity statin drug. Mixed hyperlipidemia Treated with rosuvastatin  and ezetimibe .  Lipids followed by PCP at [facility].  Will request labs for review. Essential hypertension Treated with metoprolol  and diltiazem .  Blood pressure well-controlled.  Continue current regimen. Paroxysmal atrial fibrillation (HCC) Isolated episode associated with COVID-19 pneumonia.  No recurrence.  Event monitoring performed demonstrating no atrial fibrillation or flutter.  Patient no longer on anticoagulation. Murmur, cardiac The patient has an aortic outflow murmur.  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  He has not had any recent echo studies performed.  I recommend that he have an echo next year prior to his return office visit.            Medication Adjustments/Labs and Tests Ordered: Current medicines are reviewed at length with the patient today.  Concerns regarding medicines are outlined above.  Orders Placed This Encounter  Procedures   ECHOCARDIOGRAM COMPLETE   No orders of the defined types were placed in this encounter.   Patient Instructions  Medication Instructions:  No medication changes were made at this visit. Continue current regimen.   *If you need a refill on your cardiac medications before your next appointment, please call your pharmacy*  Lab Work: None ordered today. If you have labs (blood work) drawn today and your tests are completely normal, you will receive your results only by: MyChart Message (if you have MyChart) OR A paper copy in the mail If you have any lab test that is abnormal or we need to change your treatment, we will call you to review the results.  Testing/Procedures: Your physician has requested that you have an echocardiogram prior to  1 year follow-up appointment. Echocardiography is a painless test that uses sound waves to create images of your heart. It provides your doctor with information about the size and shape of your heart and how well your hearts  chambers and valves are working. This procedure takes approximately one hour. There are no restrictions for this procedure. Please do NOT wear cologne, perfume, aftershave, or lotions (deodorant is allowed). Please arrive 15 minutes prior to your appointment time.  Please note: We ask at that you not bring children with you during ultrasound (echo/ vascular) testing. Due to room size and safety concerns, children are not allowed in the ultrasound rooms during exams. Our front office staff cannot provide observation of children in our lobby area while testing is being conducted. An adult accompanying a patient to their appointment will only be allowed in the ultrasound room at the discretion of the ultrasound technician under special circumstances. We apologize for any inconvenience.   Follow-Up: At [facility], you and your health needs are our priority.  As part of our continuing mission to provide you with exceptional heart care, our providers are all part of one team.  This team includes your primary Cardiologist (physician) and Advanced Practice Providers or APPs (Physician Assistants and Nurse Practitioners) who all work together to provide you with the care you need, when you need it.  Your next appointment:   1 year(s)  Provider:   Ozell Fell, MD      Signed, Ozell Fell, MD  11/28/2024 4:04 PM    [facility]     [1]  Current Meds  Medication Sig   aspirin  EC 81 MG tablet Take 81 mg by mouth daily.    diltiazem  (CARDIZEM  CD) 120 MG 24 hr capsule TAKE 1 CAPSULE(120 MG) BY MOUTH DAILY   ezetimibe  (ZETIA ) 10 MG tablet TAKE 1 TABLET BY MOUTH DAILY   hydroxyurea  (HYDREA ) 500 MG capsule TAKE 1 CAPSULE BY MOUTH EVERY DAY   metoprolol  tartrate (LOPRESSOR ) 25 MG tablet Take 25 mg by mouth daily.   rosuvastatin  (CRESTOR ) 40 MG tablet Take 1 tablet (40 mg total) by mouth daily. Please advise pt to make appt with provider for further refills   SSD 1 % cream Apply  topically as needed.   "

## 2024-11-28 NOTE — Patient Instructions (Signed)
 Medication Instructions:  No medication changes were made at this visit. Continue current regimen.   *If you need a refill on your cardiac medications before your next appointment, please call your pharmacy*  Lab Work: None ordered today. If you have labs (blood work) drawn today and your tests are completely normal, you will receive your results only by: MyChart Message (if you have MyChart) OR A paper copy in the mail If you have any lab test that is abnormal or we need to change your treatment, we will call you to review the results.  Testing/Procedures: Your physician has requested that you have an echocardiogram prior to 1 year follow-up appointment. Echocardiography is a painless test that uses sound waves to create images of your heart. It provides your doctor with information about the size and shape of your heart and how well your heart's chambers and valves are working. This procedure takes approximately one hour. There are no restrictions for this procedure. Please do NOT wear cologne, perfume, aftershave, or lotions (deodorant is allowed). Please arrive 15 minutes prior to your appointment time.  Please note: We ask at that you not bring children with you during ultrasound (echo/ vascular) testing. Due to room size and safety concerns, children are not allowed in the ultrasound rooms during exams. Our front office staff cannot provide observation of children in our lobby area while testing is being conducted. An adult accompanying a patient to their appointment will only be allowed in the ultrasound room at the discretion of the ultrasound technician under special circumstances. We apologize for any inconvenience.   Follow-Up: At [facility], you and your health needs are our priority.  As part of our continuing mission to provide you with exceptional heart care, our providers are all part of one team.  This team includes your primary Cardiologist (physician) and Advanced  Practice Providers or APPs (Physician Assistants and Nurse Practitioners) who all work together to provide you with the care you need, when you need it.  Your next appointment:   1 year(s)  Provider:   Ozell Fell, MD

## 2025-01-23 ENCOUNTER — Ambulatory Visit: Admitting: Cardiovascular Disease

## 2025-02-04 ENCOUNTER — Inpatient Hospital Stay

## 2025-02-04 ENCOUNTER — Inpatient Hospital Stay: Admitting: Oncology

## 2025-11-09 ENCOUNTER — Ambulatory Visit (HOSPITAL_COMMUNITY)
